# Patient Record
Sex: Female | Born: 1990 | Race: White | Hispanic: No | Marital: Single | Smoking: Former smoker
Health system: Southern US, Community
[De-identification: ages and names within clinical notes are randomized; demographics above are authoritative.]

## PROBLEM LIST (undated history)

## (undated) ENCOUNTER — Inpatient Hospital Stay (HOSPITAL_COMMUNITY): Payer: Self-pay

## (undated) ENCOUNTER — Ambulatory Visit

## (undated) DIAGNOSIS — R519 Headache, unspecified: Secondary | ICD-10-CM

## (undated) DIAGNOSIS — F419 Anxiety disorder, unspecified: Secondary | ICD-10-CM

## (undated) DIAGNOSIS — O24419 Gestational diabetes mellitus in pregnancy, unspecified control: Secondary | ICD-10-CM

## (undated) DIAGNOSIS — K760 Fatty (change of) liver, not elsewhere classified: Secondary | ICD-10-CM

## (undated) DIAGNOSIS — K219 Gastro-esophageal reflux disease without esophagitis: Secondary | ICD-10-CM

## (undated) DIAGNOSIS — Z8619 Personal history of other infectious and parasitic diseases: Secondary | ICD-10-CM

## (undated) DIAGNOSIS — J40 Bronchitis, not specified as acute or chronic: Secondary | ICD-10-CM

## (undated) DIAGNOSIS — J45909 Unspecified asthma, uncomplicated: Secondary | ICD-10-CM

## (undated) DIAGNOSIS — F329 Major depressive disorder, single episode, unspecified: Secondary | ICD-10-CM

## (undated) DIAGNOSIS — O1490 Unspecified pre-eclampsia, unspecified trimester: Secondary | ICD-10-CM

## (undated) DIAGNOSIS — F32A Depression, unspecified: Secondary | ICD-10-CM

## (undated) HISTORY — PX: GALLBLADDER SURGERY: SHX652

## (undated) HISTORY — PX: WISDOM TOOTH EXTRACTION: SHX21

---

## 2014-02-11 ENCOUNTER — Emergency Department (HOSPITAL_COMMUNITY)
Admission: EM | Admit: 2014-02-11 | Discharge: 2014-02-11 | Disposition: A | Discharge status: Home / Self Care | Payer: Medicaid Other | Attending: Emergency Medicine | Admitting: Emergency Medicine

## 2014-02-11 ENCOUNTER — Emergency Department (HOSPITAL_COMMUNITY): Payer: Medicaid Other

## 2014-02-11 ENCOUNTER — Encounter (HOSPITAL_COMMUNITY): Payer: Self-pay | Admitting: Emergency Medicine

## 2014-02-11 DIAGNOSIS — N390 Urinary tract infection, site not specified: Secondary | ICD-10-CM

## 2014-02-11 DIAGNOSIS — Z720 Tobacco use: Secondary | ICD-10-CM | POA: Insufficient documentation

## 2014-02-11 DIAGNOSIS — Z79899 Other long term (current) drug therapy: Secondary | ICD-10-CM | POA: Insufficient documentation

## 2014-02-11 DIAGNOSIS — Z331 Pregnant state, incidental: Secondary | ICD-10-CM | POA: Diagnosis not present

## 2014-02-11 DIAGNOSIS — F329 Major depressive disorder, single episode, unspecified: Secondary | ICD-10-CM | POA: Insufficient documentation

## 2014-02-11 DIAGNOSIS — M545 Low back pain: Secondary | ICD-10-CM | POA: Diagnosis present

## 2014-02-11 DIAGNOSIS — M549 Dorsalgia, unspecified: Secondary | ICD-10-CM

## 2014-02-11 DIAGNOSIS — J45909 Unspecified asthma, uncomplicated: Secondary | ICD-10-CM | POA: Diagnosis not present

## 2014-02-11 DIAGNOSIS — Z3201 Encounter for pregnancy test, result positive: Secondary | ICD-10-CM

## 2014-02-11 HISTORY — DX: Major depressive disorder, single episode, unspecified: F32.9

## 2014-02-11 HISTORY — DX: Unspecified asthma, uncomplicated: J45.909

## 2014-02-11 HISTORY — DX: Depression, unspecified: F32.A

## 2014-02-11 LAB — BASIC METABOLIC PANEL WITH GFR
Anion gap: 11 (ref 5–15)
BUN: 6 mg/dL (ref 6–23)
CO2: 21 mmol/L (ref 19–32)
Calcium: 8.9 mg/dL (ref 8.4–10.5)
Chloride: 106 meq/L (ref 96–112)
Creatinine, Ser: 0.66 mg/dL (ref 0.50–1.10)
GFR calc Af Amer: >90 mL/min
GFR calc non Af Amer: >90 mL/min
Glucose, Bld: 102 mg/dL — ABNORMAL HIGH (ref 70–99)
Potassium: 4.2 mmol/L (ref 3.5–5.1)
Sodium: 138 mmol/L (ref 135–145)

## 2014-02-11 LAB — CBC WITH DIFFERENTIAL/PLATELET
Basophils Absolute: 0 K/uL (ref 0.0–0.1)
Basophils Relative: 0 % (ref 0–1)
Eosinophils Absolute: 0 K/uL (ref 0.0–0.7)
Eosinophils Relative: 0 % (ref 0–5)
HCT: 37.2 % (ref 36.0–46.0)
Hemoglobin: 12.1 g/dL (ref 12.0–15.0)
Lymphocytes Relative: 20 % (ref 12–46)
Lymphs Abs: 1.9 K/uL (ref 0.7–4.0)
MCH: 27.3 pg (ref 26.0–34.0)
MCHC: 32.5 g/dL (ref 30.0–36.0)
MCV: 84 fL (ref 78.0–100.0)
Monocytes Absolute: 0.4 K/uL (ref 0.1–1.0)
Monocytes Relative: 4 % (ref 3–12)
Neutro Abs: 7.4 K/uL (ref 1.7–7.7)
Neutrophils Relative %: 76 % (ref 43–77)
Platelets: 241 K/uL (ref 150–400)
RBC: 4.43 MIL/uL (ref 3.87–5.11)
RDW: 14.9 % (ref 11.5–15.5)
WBC: 9.7 K/uL (ref 4.0–10.5)

## 2014-02-11 LAB — URINALYSIS, ROUTINE W REFLEX MICROSCOPIC
Bilirubin Urine: NEGATIVE
Glucose, UA: NEGATIVE mg/dL
KETONES UR: NEGATIVE mg/dL
Nitrite: NEGATIVE
Protein, ur: 30 mg/dL — AB
Specific Gravity, Urine: 1.012 (ref 1.005–1.030)
UROBILINOGEN UA: 0.2 mg/dL (ref 0.0–1.0)
pH: 7 (ref 5.0–8.0)

## 2014-02-11 LAB — WET PREP, GENITAL
Trich, Wet Prep: NONE SEEN
Yeast Wet Prep HPF POC: NONE SEEN

## 2014-02-11 LAB — URINE MICROSCOPIC-ADD ON

## 2014-02-11 LAB — PREGNANCY, URINE: Preg Test, Ur: POSITIVE — AB

## 2014-02-11 LAB — HCG, QUANTITATIVE, PREGNANCY: hCG, Beta Chain, Quant, S: 93 m[IU]/mL — ABNORMAL HIGH

## 2014-02-11 IMAGING — DX DG THORACIC SPINE 2V
3 series · 3 of 3 positions shown · non-contrast
Comparison: None.

CLINICAL DATA: Chronic pain and lower back pain for months,
worsened tonight. Initial encounter.

EXAM:
THORACIC SPINE - 2 VIEW

[t-spine ap]
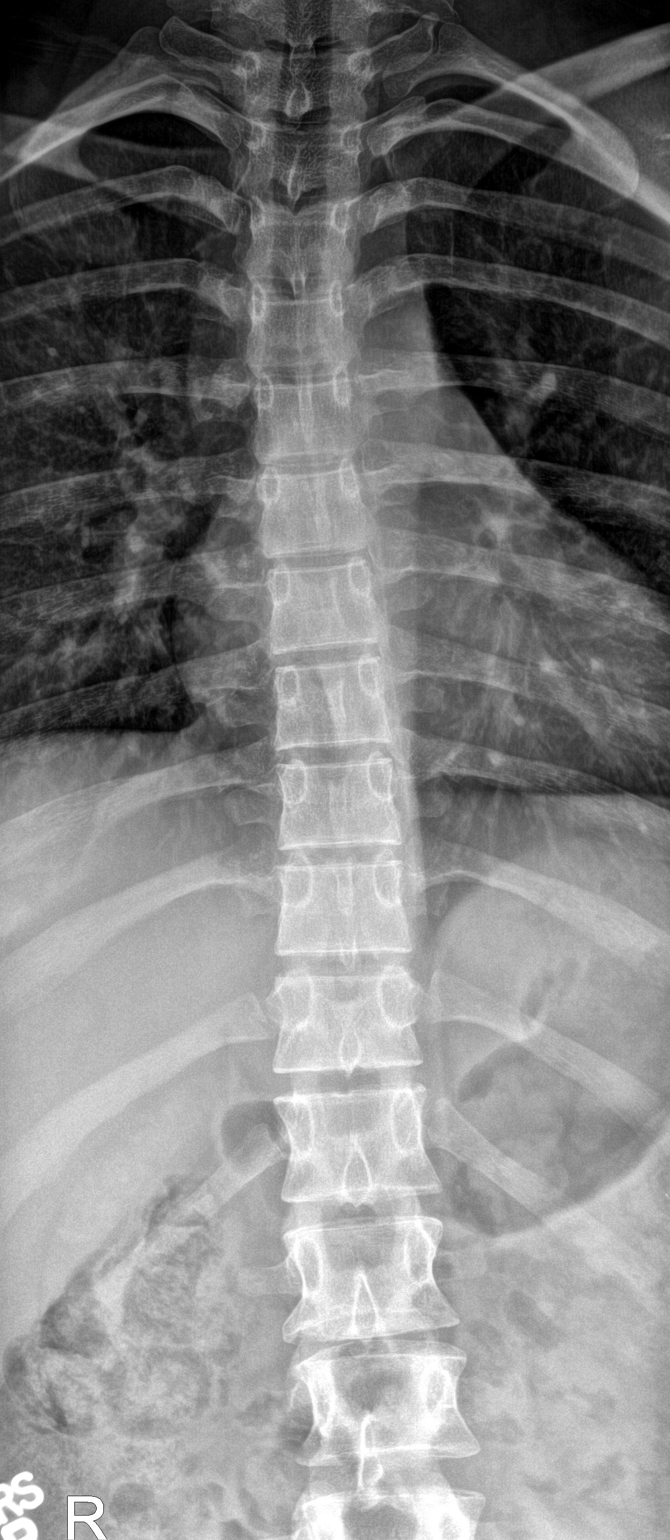

[t-spine lat]
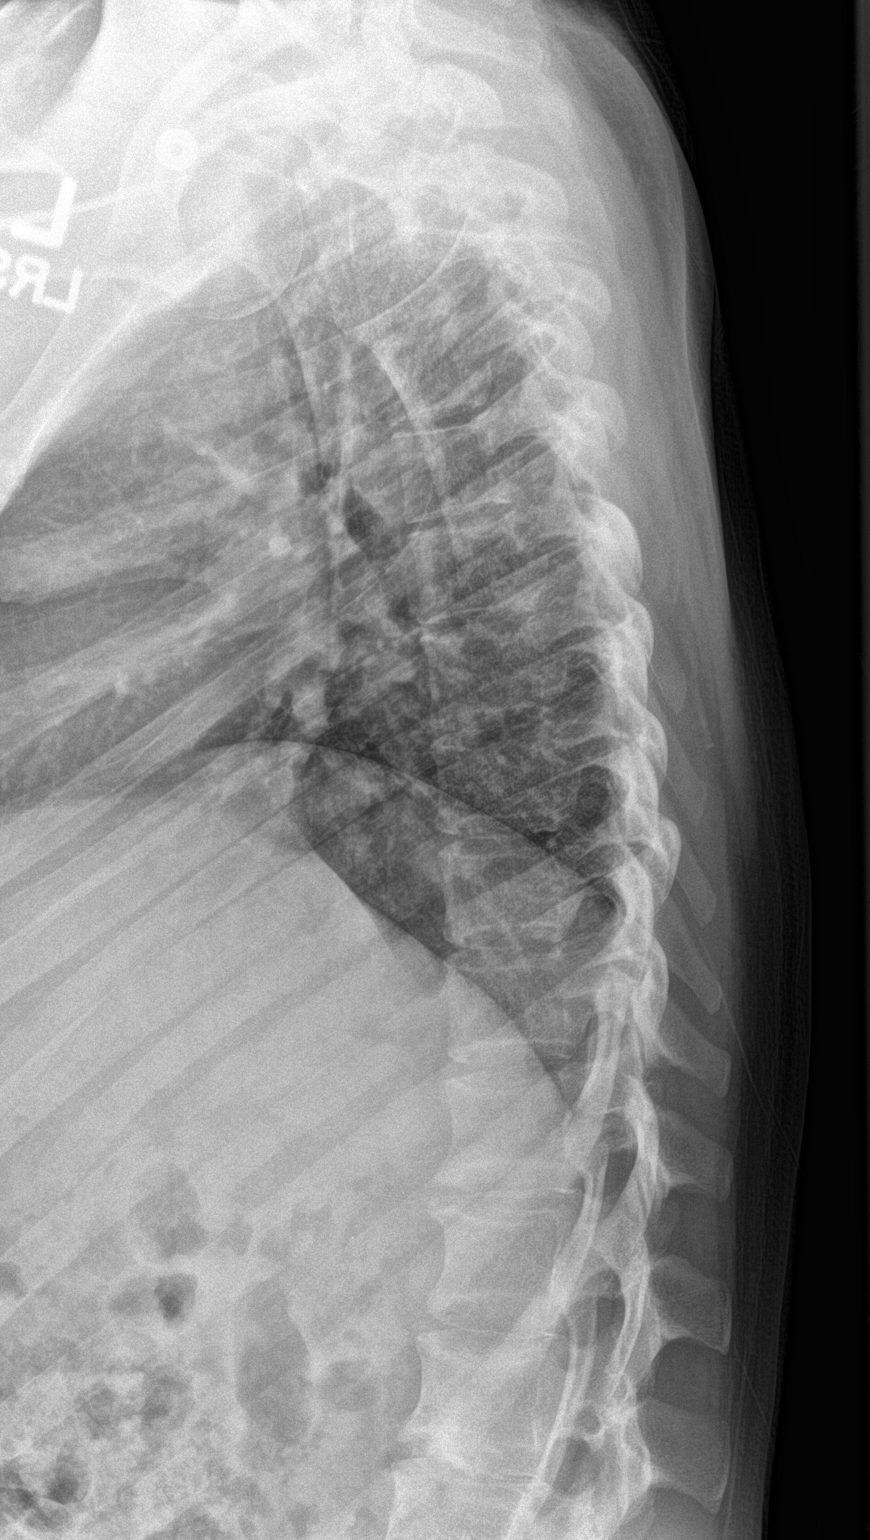

[t-spine swimmers]
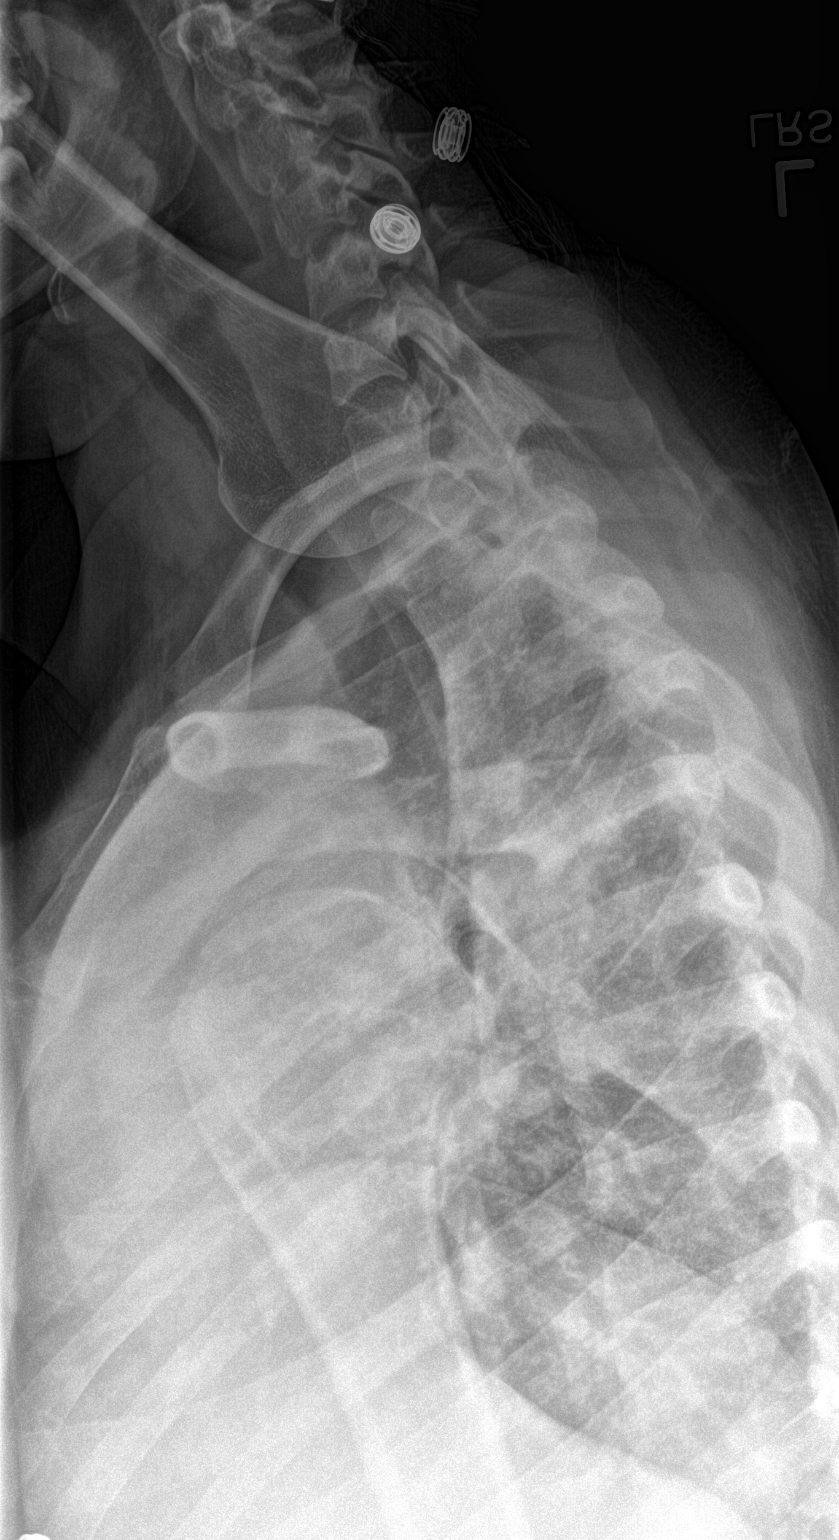

[3 of 3 positions shown; findings below may reference images not displayed]

FINDINGS: There is no evidence of fracture or subluxation. Vertebral bodies
demonstrate normal height and alignment. Intervertebral disc spaces
are preserved.

The visualized portions of both lungs are clear. The mediastinum is
unremarkable in appearance.
IMPRESSION: No evidence of fracture or subluxation along the thoracic spine.

## 2014-02-11 IMAGING — US US OB COMP LESS 14 WK
1 series · 13 of 28 positions shown · non-contrast
Comparison: None.

CLINICAL DATA: Back pain. Estimated gestational age by LMP is 3
weeks 0 days. Quantitative beta HCG is 93.

EXAM:
OBSTETRIC <14 WK US AND TRANSVAGINAL OB US
TECHNIQUE: Both transabdominal and transvaginal ultrasound examinations were
performed for complete evaluation of the gestation as well as the
maternal uterus, adnexal regions, and pelvic cul-de-sac.
Transvaginal technique was performed to assess early pregnancy.

[Series 1: us ob comp less 14 wk · 0.22mm/px · 31 acquisitions, 13 frames shown]
[im 2/31]
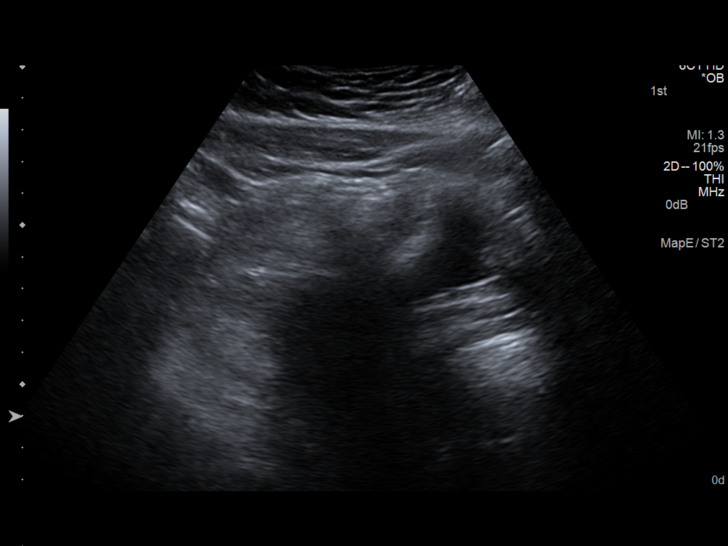
[im 4/31]
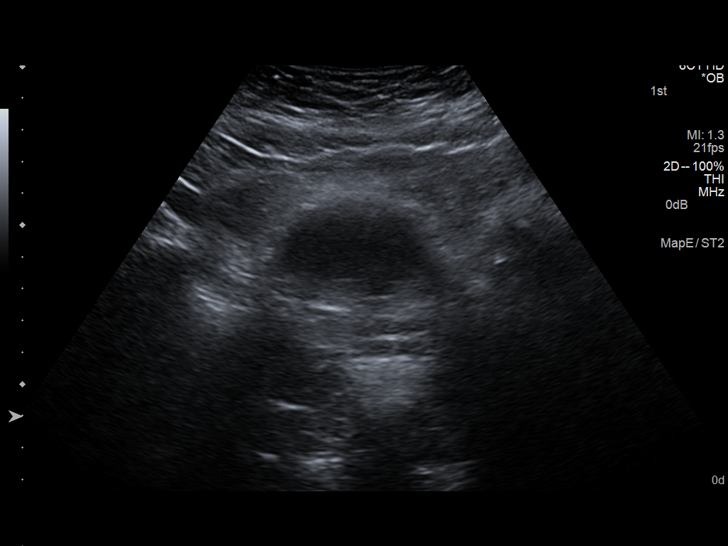
[im 6/31]
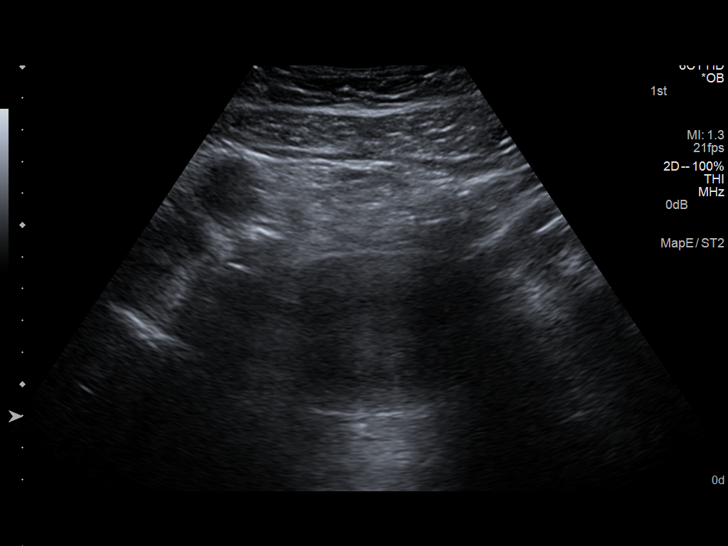
[im 8/31]
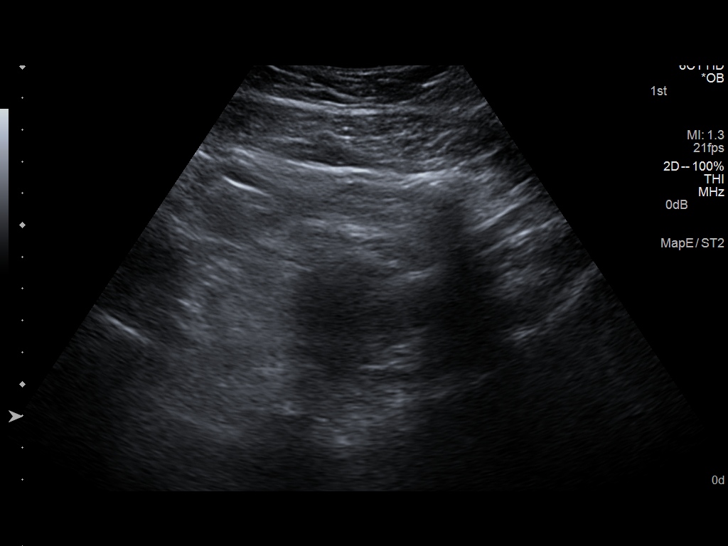
[im 11/31]
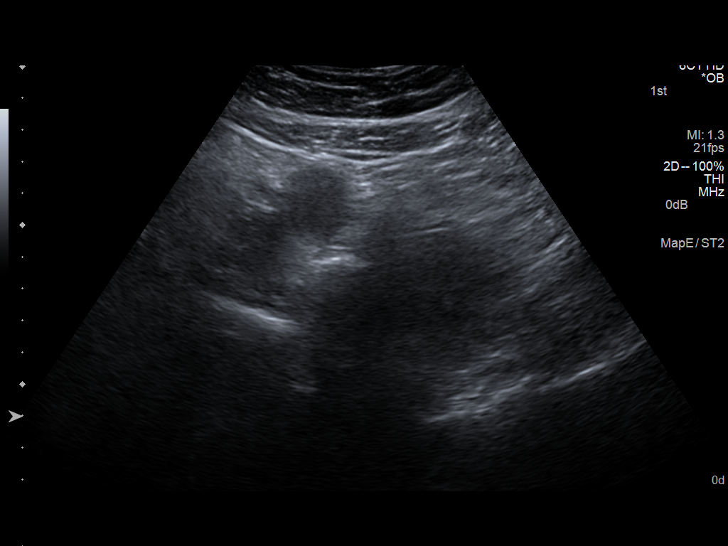
[im 13/31]
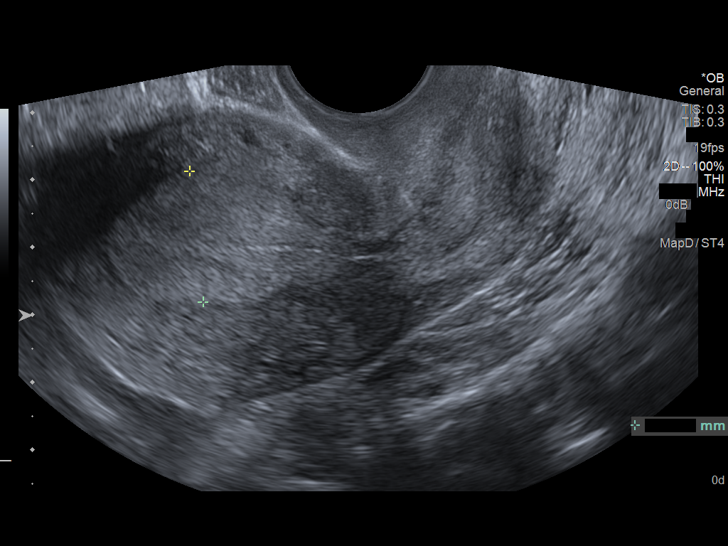
[im 16/31]
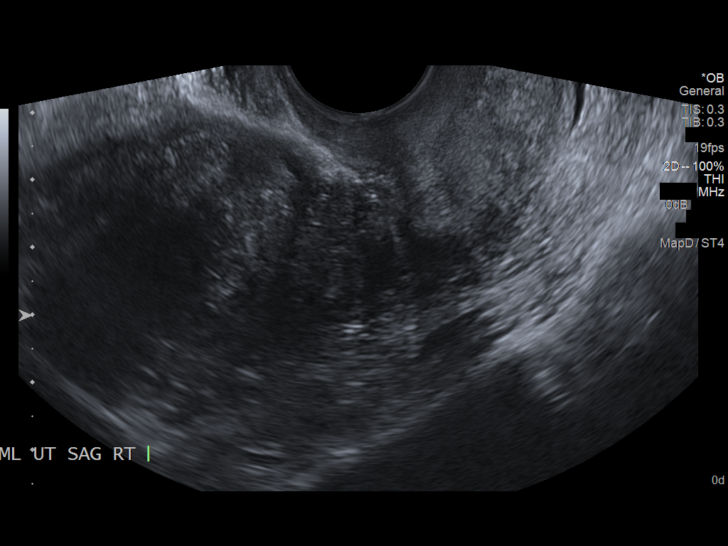
[im 18/31]
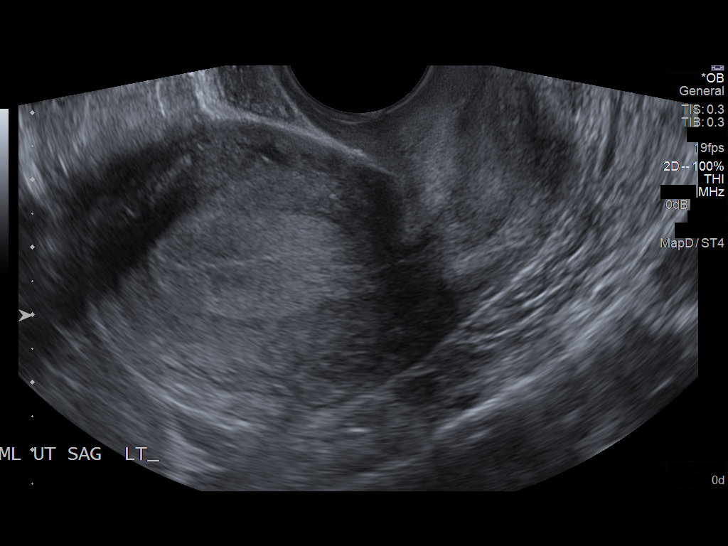
[im 21/31]
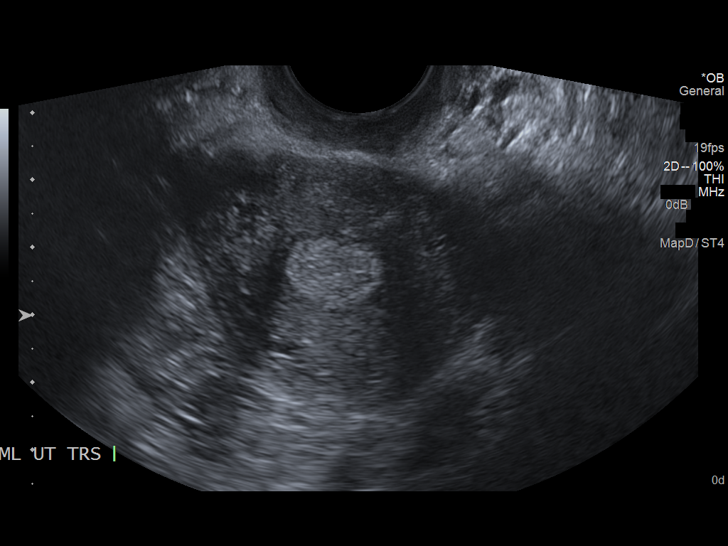
[im 23/31]
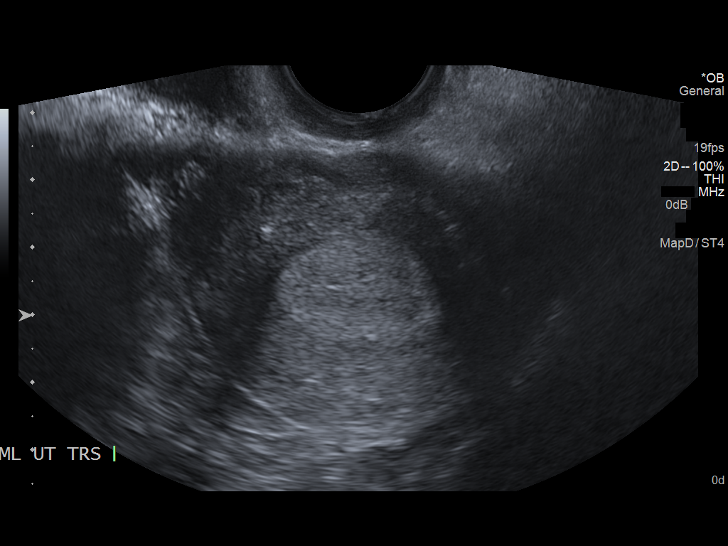
[im 25/31]
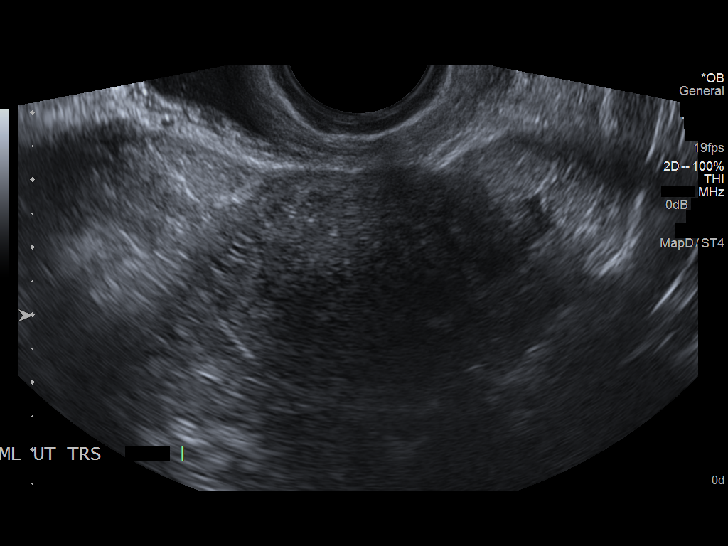
[im 27/31]
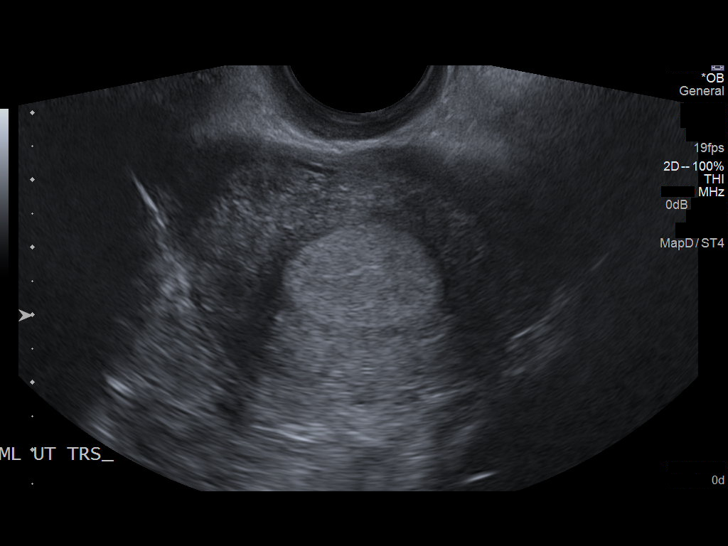
[im 29/31]
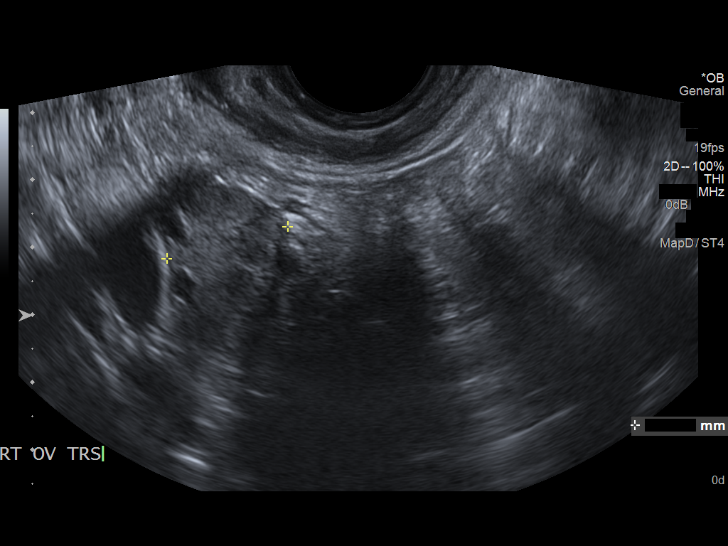

[13 of 28 positions shown; findings below may reference images not displayed]

FINDINGS: Intrauterine gestational sac: No intrauterine gestational sac is
identified.

Yolk sac:  Not identified.

Embryo:  Not identified.

Cardiac Activity: Not identified.

Maternal uterus/adnexae: Uterus is anteverted. No myometrial mass
lesions demonstrated. Endometrial stripe is thickened about 19 mm.
No endometrial fluid is demonstrated. The right ovary is visualized
and appears normal. No abnormal adnexal masses. Left ovary is not
visualized due to overlying bowel gas. No free pelvic fluid is
demonstrated.
IMPRESSION: No intrauterine gestational sac, yolk sac, or fetal pole identified.
Differential considerations include intrauterine pregnancy too early
to be sonographically visualized, missed abortion, or ectopic
pregnancy. Followup ultrasound is recommended in 10-14 days for
further evaluation.

## 2014-02-11 IMAGING — DX DG LUMBAR SPINE COMPLETE 4+V
5 series · 5 of 5 positions shown · non-contrast
Comparison: None.

CLINICAL DATA: Chronic mid and lower back pain, worsening tonight.
Initial encounter.

EXAM:
LUMBAR SPINE - COMPLETE 4+ VIEW

[l-spine ap]
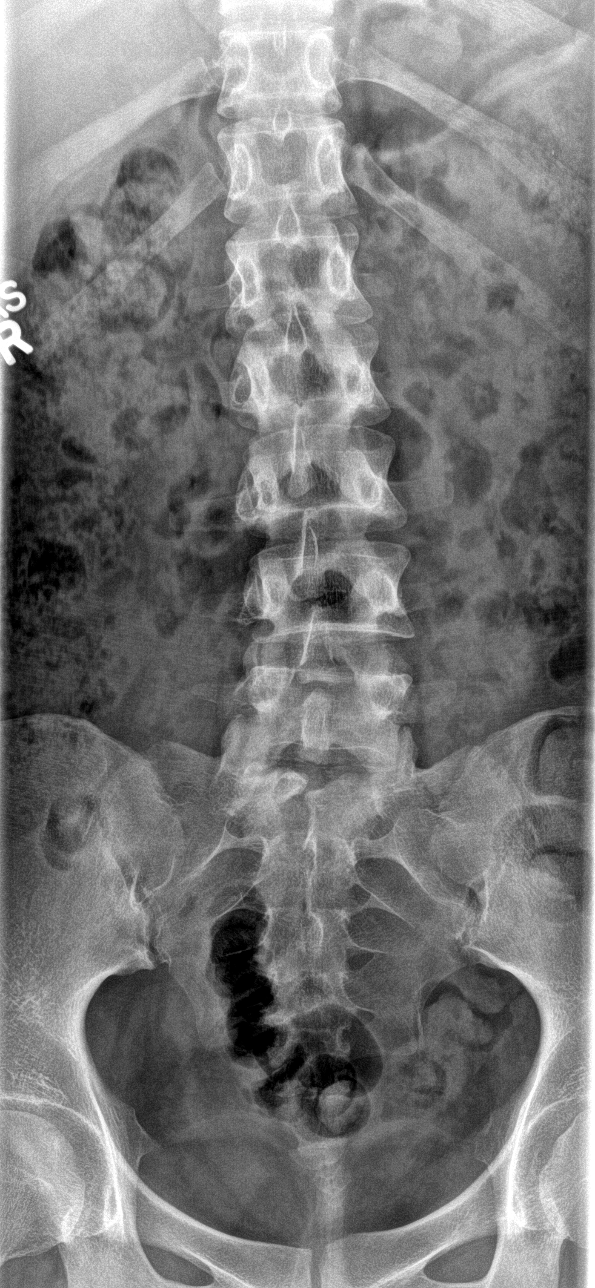

[l-spine obl (1 of 2)]
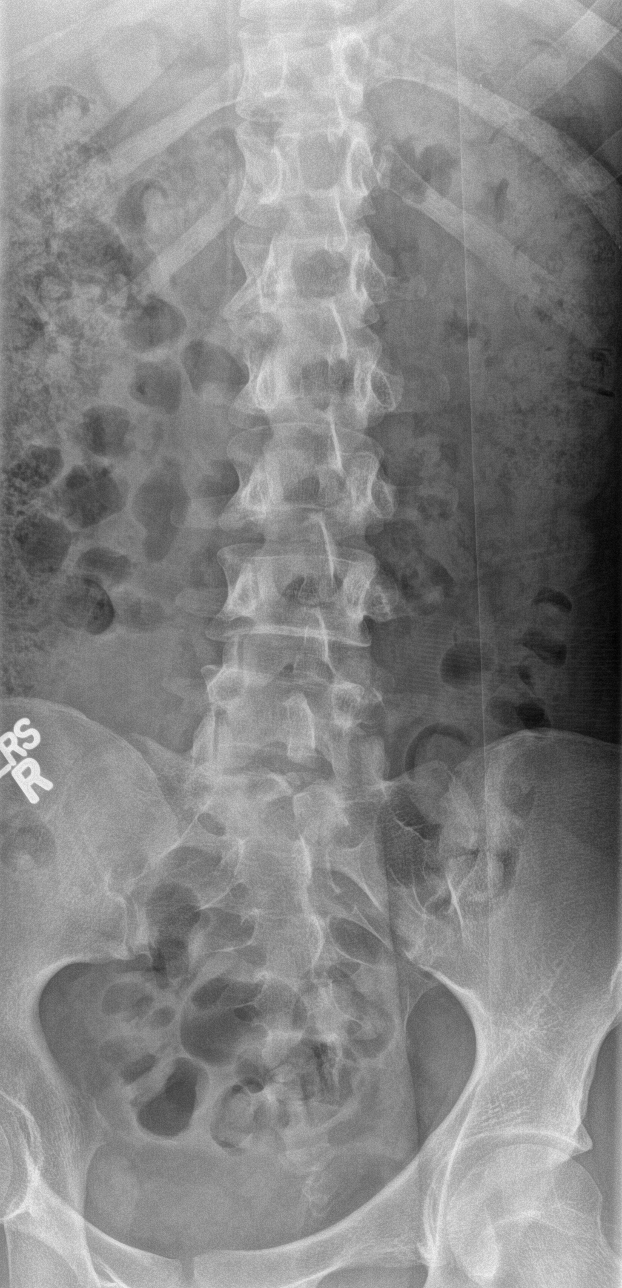

[l-spine obl (2 of 2)]
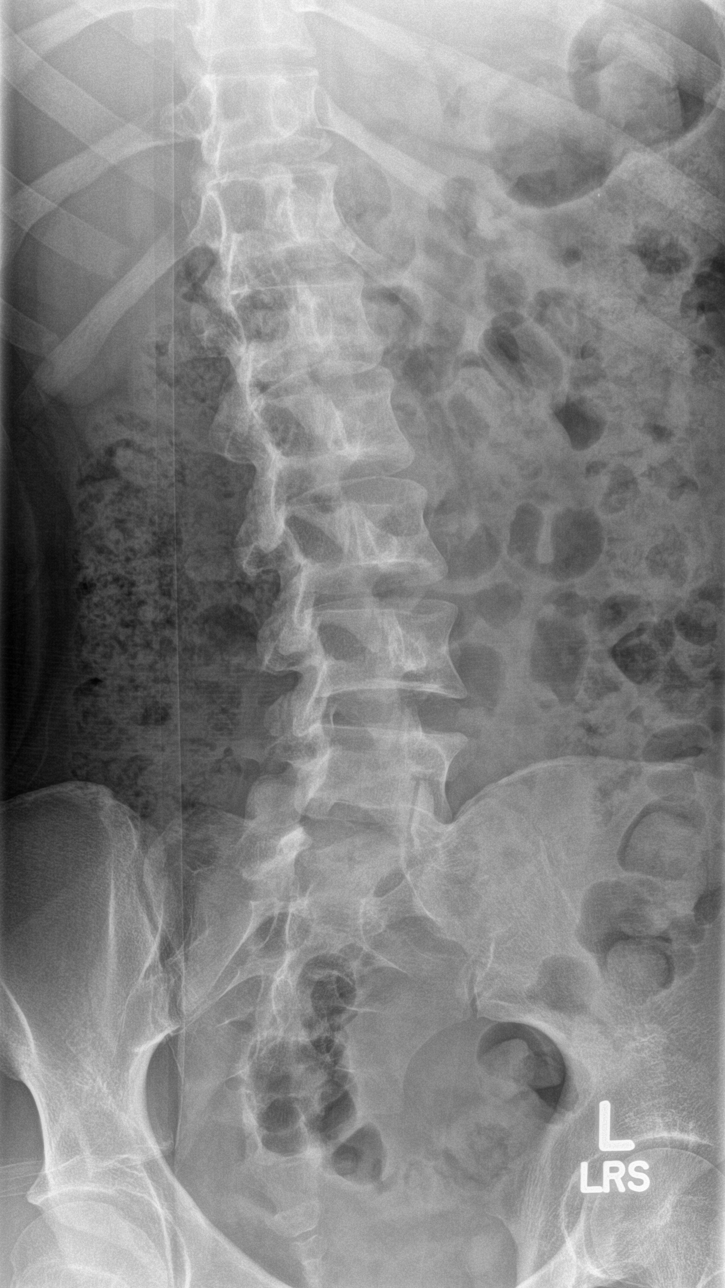

[l-spine lat]
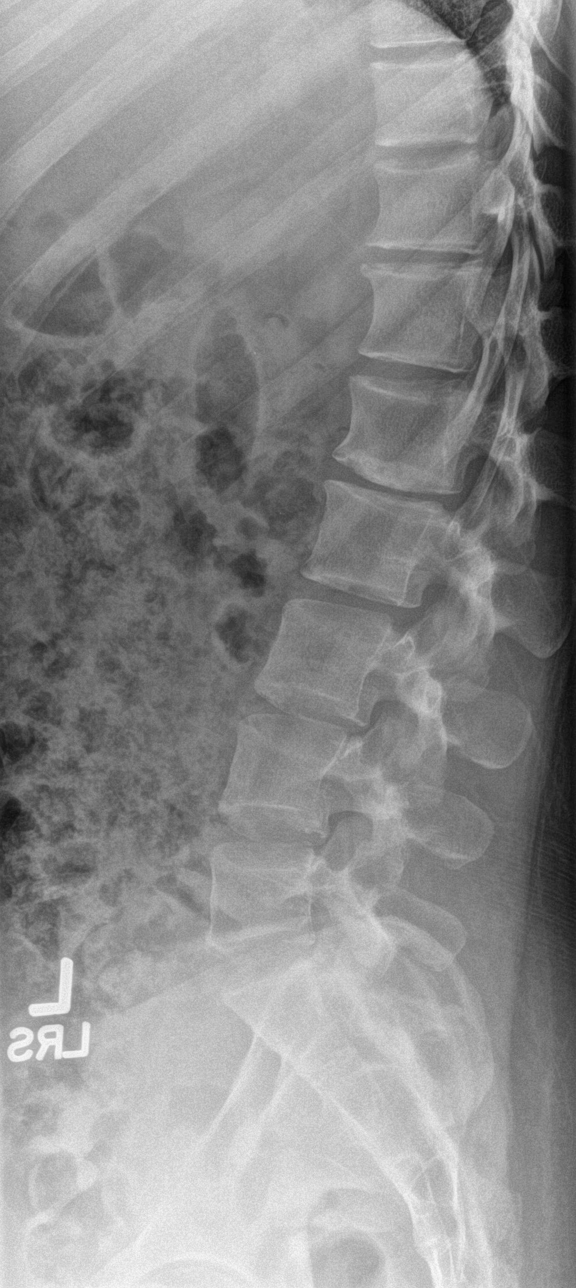

[l-spine spot]
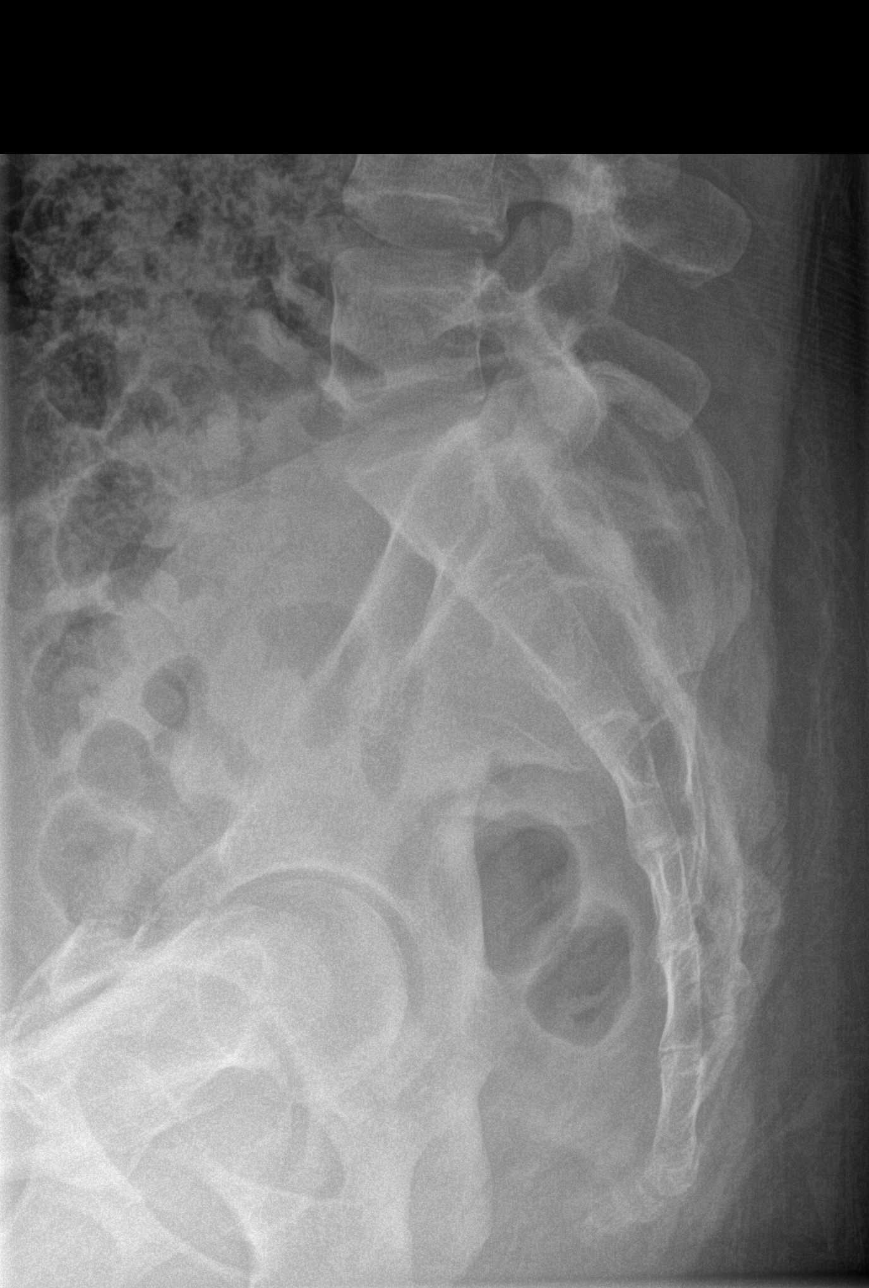

[5 of 5 positions shown; findings below may reference images not displayed]

FINDINGS: There is no evidence of fracture or subluxation. Vertebral bodies
demonstrate normal height and alignment. Slight developmental
irregularity is noted along the inferior endplate of L1.
Intervertebral disc spaces are preserved. The visualized neural
foramina are grossly unremarkable in appearance.

The visualized bowel gas pattern is unremarkable in appearance; air
and stool are noted within the colon. The sacroiliac joints are
within normal limits.
IMPRESSION: No evidence of fracture or subluxation along the lumbar spine.

## 2014-02-11 MED ORDER — ACETAMINOPHEN 500 MG PO TABS: 500.0000 mg | ORAL_TABLET | Freq: Four times a day (QID) | ORAL | Status: DC | PRN

## 2014-02-11 MED ORDER — CEPHALEXIN 500 MG PO CAPS: 500.0000 mg | ORAL_CAPSULE | Freq: Three times a day (TID) | ORAL | Status: DC

## 2014-02-11 MED ORDER — OXYCODONE-ACETAMINOPHEN 5-325 MG PO TABS
2.0000 | ORAL_TABLET | Freq: Once | ORAL | Status: DC
  Filled 2014-02-11: qty 2

## 2014-02-11 MED ORDER — SODIUM CHLORIDE 0.9 % IV BOLUS (SEPSIS)
1000.0000 mL | Freq: Once | INTRAVENOUS | Status: AC
  Administered 2014-02-11: 1000 mL via INTRAVENOUS

## 2014-02-11 MED ORDER — DEXTROSE 5 % IV SOLN
1.0000 g | Freq: Once | INTRAVENOUS | Status: AC
  Administered 2014-02-11: 1 g via INTRAVENOUS
  Filled 2014-02-11: qty 10

## 2014-02-11 NOTE — ED Notes (Signed)
Pt. reports mid back and left lower back pain onset today , denies injury /ambulatory , no hematuria or dysuria .

## 2014-02-11 NOTE — ED Provider Notes (Signed)
CSN: [id number]     Arrival date & time 02/11/14  0028 History   First MD Initiated Contact with Patient 02/11/14 0053     Chief Complaint  Patient presents with  . Back Pain    (Consider location/radiation/quality/duration/timing/severity/associated sxs/prior Treatment) HPI Comments: 24 year old female with a history of depression and asthma presents to the emergency department for further evaluation of back pain. Patient states that she began to have back pain yesterday morning. It persisted for most of the day, but improved later in the afternoon. Patient states the pain returned around dinnertime yesterday. It was worse than prior onset. Patient attempted to take ibuprofen as well as use a heating pad for symptoms without relief. She states that pain is worse with certain movements as well as palpation to her back. She states that her father tried to massage her back as well, but this provided no relief. Patient reports some mild associated nausea with worsening pain. Patient denies associated fever, abdominal pain, dysuria, hematuria, vaginal bleeding, vaginal discharge, extremity numbness/paresthesias, extremity weakness, history of cancer, history of IV drug use, or bowel/bladder incontinence. Patient denies any trauma or injury to her back inciting symptoms.  Patient is a 24 y.o. female presenting with back pain. The history is provided by the patient. No language interpreter was used.  Back Pain Associated symptoms: no dysuria, no fever, no numbness and no weakness     Past Medical History  Diagnosis Date  . Depression   . Asthma    History reviewed. No pertinent past surgical history. No family history on file. History  Substance Use Topics  . Smoking status: Current Every Day Smoker  . Smokeless tobacco: Not on file  . Alcohol Use: Yes   OB History    No data available      Review of Systems  Constitutional: Negative for fever.  Gastrointestinal: Negative for nausea and  vomiting.  Genitourinary: Negative for dysuria, hematuria, vaginal bleeding and vaginal discharge.  Musculoskeletal: Positive for back pain.  Neurological: Negative for weakness and numbness.  All other systems reviewed and are negative.   Allergies  Review of patient's allergies indicates no known allergies.  Home Medications   Prior to Admission medications   Medication Sig Start Date End Date Taking? Authorizing Provider  acetaminophen (TYLENOL) 500 MG tablet Take 1 tablet (500 mg total) by mouth every 6 (six) hours as needed. 02/11/14   Antony Madura, PA-C  cephALEXin (KEFLEX) 500 MG capsule Take 1 capsule (500 mg total) by mouth 3 (three) times daily. 02/11/14   Antony Madura, PA-C  FLUoxetine (PROZAC) 20 MG capsule Take 40 mg by mouth daily.   Yes Historical Provider, MD  ibuprofen (ADVIL,MOTRIN) 200 MG tablet Take 200 mg by mouth every 6 (six) hours as needed for moderate pain.   Yes Historical Provider, MD   BP 107/65 mmHg  Pulse 90  Temp(Src) 99.2 F (37.3 C) (Oral)  Resp 18  Ht  (1.626 m)  Wt 157 lb (71.215 kg)  BMI 26.94 kg/m2  SpO2 100%  LMP 01/21/2014   Physical Exam  Constitutional: She is oriented to person, place, and time. She appears well-developed and well-nourished. No distress.  Nontoxic/nonseptic appearing. Patient does appear uncomfortable.  HENT:  Head: Normocephalic and atraumatic.  Eyes: Conjunctivae and EOM are normal. No scleral icterus.  Neck: Normal range of motion.  Cardiovascular: Normal rate, regular rhythm and intact distal pulses.   DP and PT pulses 2+ bilaterally  Pulmonary/Chest: Effort normal. No respiratory distress.  Respirations even and unlabored  Abdominal: Soft. She exhibits no distension. There is no tenderness.  Abdomen soft without tenderness or peritoneal signs. No guarding. No masses.  Genitourinary: There is no rash, tenderness, lesion or injury on the right labia. There is no rash, tenderness, lesion or injury on the left  labia. Uterus is not tender. Cervix exhibits no motion tenderness, no discharge and no friability. Right adnexum displays no mass, no tenderness and no fullness. Left adnexum displays no mass, no tenderness and no fullness. No bleeding in the vagina. No vaginal discharge found.  Musculoskeletal: Normal range of motion.  Tenderness to palpation to lumbar midline. No crepitus or bony deformity appreciated. Mild tenderness to bilateral lumbar paraspinal muscles without appreciable spasm. Negative straight leg raise and crossed straight leg raise.  Neurological: She is alert and oriented to person, place, and time. She exhibits normal muscle tone. Coordination normal.  Sensation to light touch intact. Patellar and Achilles reflexes 2+ bilaterally  Skin: Skin is warm and dry. No rash noted. She is not diaphoretic. No erythema. No pallor.  Psychiatric: She has a normal mood and affect. Her behavior is normal.  Nursing note and vitals reviewed.   ED Course  Procedures (including critical care time) Labs Review Labs Reviewed  WET PREP, GENITAL - Abnormal; Notable for the following:    Clue Cells Wet Prep HPF POC FEW (*)    WBC, Wet Prep HPF POC FEW (*)    All other components within normal limits  URINALYSIS, ROUTINE W REFLEX MICROSCOPIC - Abnormal; Notable for the following:    APPearance CLOUDY (*)    Hgb urine dipstick MODERATE (*)    Protein, ur 30 (*)    Leukocytes, UA LARGE (*)    All other components within normal limits  PREGNANCY, URINE - Abnormal; Notable for the following:    Preg Test, Ur POSITIVE (*)    All other components within normal limits  URINE MICROSCOPIC-ADD ON - Abnormal; Notable for the following:    Squamous Epithelial / LPF FEW (*)    Bacteria, UA FEW (*)    All other components within normal limits  BASIC METABOLIC PANEL - Abnormal; Notable for the following:    Glucose, Bld 102 (*)    All other components within normal limits  HCG, QUANTITATIVE, PREGNANCY -  Abnormal; Notable for the following:    hCG, Beta Chain, Quant, S 93 (*)    All other components within normal limits  GC/CHLAMYDIA PROBE AMP  URINE CULTURE  CBC WITH DIFFERENTIAL    Imaging Review Dg Thoracic Spine 2 View  02/11/2014   CLINICAL DATA:  Chronic pain and lower back pain for months, worsened tonight. Initial encounter.  EXAM: THORACIC SPINE - 2 VIEW  COMPARISON:  None.  FINDINGS: There is no evidence of fracture or subluxation. Vertebral bodies demonstrate normal height and alignment. Intervertebral disc spaces are preserved.  The visualized portions of both lungs are clear. The mediastinum is unremarkable in appearance.  IMPRESSION: No evidence of fracture or subluxation along the thoracic spine.   Electronically Signed   By: Roanna Raider M.D.   On: 02/11/2014 01:43   Dg Lumbar Spine Complete  02/11/2014   CLINICAL DATA:  Chronic mid and lower back pain, worsening tonight. Initial encounter.  EXAM: LUMBAR SPINE - COMPLETE 4+ VIEW  COMPARISON:  None.  FINDINGS: There is no evidence of fracture or subluxation. Vertebral bodies demonstrate normal height and alignment. Slight developmental irregularity is noted along the inferior endplate  of L1. Intervertebral disc spaces are preserved. The visualized neural foramina are grossly unremarkable in appearance.  The visualized bowel gas pattern is unremarkable in appearance; air and stool are noted within the colon. The sacroiliac joints are within normal limits.  IMPRESSION: No evidence of fracture or subluxation along the lumbar spine.   Electronically Signed   By: Roanna Raider M.D.   On: 02/11/2014 01:42   US Ob Comp Less 14 Wks  02/11/2014   CLINICAL DATA:  Back pain. Estimated gestational age by LMP is 3 weeks 0 days. Quantitative beta HCG is 93.  EXAM: OBSTETRIC <14 WK [place] AND TRANSVAGINAL OB US  TECHNIQUE: Both transabdominal and transvaginal ultrasound examinations were performed for complete evaluation of the gestation as well as the  maternal uterus, adnexal regions, and pelvic cul-de-sac. Transvaginal technique was performed to assess early pregnancy.  COMPARISON:  None.  FINDINGS: Intrauterine gestational sac: No intrauterine gestational sac is identified.  Yolk sac:  Not identified.  Embryo:  Not identified.  Cardiac Activity: Not identified.  Maternal uterus/adnexae: Uterus is anteverted. No myometrial mass lesions demonstrated. Endometrial stripe is thickened about 19 mm. No endometrial fluid is demonstrated. The right ovary is visualized and appears normal. No abnormal adnexal masses. Left ovary is not visualized due to overlying bowel gas. No free pelvic fluid is demonstrated.  IMPRESSION: No intrauterine gestational sac, yolk sac, or fetal pole identified. Differential considerations include intrauterine pregnancy too early to be sonographically visualized, missed abortion, or ectopic pregnancy. Followup ultrasound is recommended in 10-14 days for further evaluation.   Electronically Signed   By: Burman Nieves M.D.   On: 02/11/2014 03:43   US Ob Transvaginal  02/11/2014   CLINICAL DATA:  Back pain. Estimated gestational age by LMP is 3 weeks 0 days. Quantitative beta HCG is 93.  EXAM: OBSTETRIC <14 WK [place] AND TRANSVAGINAL OB US  TECHNIQUE: Both transabdominal and transvaginal ultrasound examinations were performed for complete evaluation of the gestation as well as the maternal uterus, adnexal regions, and pelvic cul-de-sac. Transvaginal technique was performed to assess early pregnancy.  COMPARISON:  None.  FINDINGS: Intrauterine gestational sac: No intrauterine gestational sac is identified.  Yolk sac:  Not identified.  Embryo:  Not identified.  Cardiac Activity: Not identified.  Maternal uterus/adnexae: Uterus is anteverted. No myometrial mass lesions demonstrated. Endometrial stripe is thickened about 19 mm. No endometrial fluid is demonstrated. The right ovary is visualized and appears normal. No abnormal adnexal masses. Left  ovary is not visualized due to overlying bowel gas. No free pelvic fluid is demonstrated.  IMPRESSION: No intrauterine gestational sac, yolk sac, or fetal pole identified. Differential considerations include intrauterine pregnancy too early to be sonographically visualized, missed abortion, or ectopic pregnancy. Followup ultrasound is recommended in 10-14 days for further evaluation.   Electronically Signed   By: Burman Nieves M.D.   On: 02/11/2014 03:43     EKG Interpretation None      MDM   Final diagnoses:  Back pain  Positive pregnancy test  UTI (lower urinary tract infection)    24 year old female presents to the emergency department for further evaluation of persistent back pain. Back pain began yesterday morning and persisted through the evening. Urinalysis completed shortly after patient arrival. Urinalysis suggests infection. Possible that back pain is a result of pyelonephritis/ascending infection. Urine pregnancy, however, was also found to be positive. Patient unfortunately was brought x-ray prior to resulting of her urine pregnancy. Radiology was informed of premature imaging; safety portal completed  by Set designerX-ray supervisor. Imaging found to be negative for acute bony pathology.  Given positive urine pregnancy, further work up was pursued with blood work and ultrasound imaging. Patient was found to have no leukocytosis today. Kidney function was preserved. No anemia or electrolyte imbalance. hCG Quant was 93 today. This correlates with pregnancy of approximately 2 weeks, c/w inability to visualize any specific findings on pelvic U/S.   I have discussed with the patient inability to rule out ectopic pregnancy given today's workup. Have [facility] Will also treat for UTI/early pyelonephritis with outpatient Keflex course. Initial Rocephin bolus given PTD. Tylenol advised for pain control and return precautions discussed. Patient  agreeable to plan with no unaddressed concerns. Patient discharged in good condition; VSS.   Filed Vitals:   02/11/14 0036 02/11/14 0315 02/11/14 0454  BP: 112/62 117/62 107/65  Pulse: 98 98 90  Temp: 99.2 F (37.3 C)    TempSrc: Oral    Resp: 16  18  Height: 5\' 4"  (1.626 m)    Weight: 157 lb (71.215 kg)    SpO2: 99% 100% 100%     Antony MaduraKelly Demarlo Riojas, PA-C 02/11/14 0522  Tomasita CrumbleAdeleke Oni, MD 02/11/14 1417

## 2014-02-11 NOTE — Discharge Instructions (Signed)
Take Keflex as prescribed for infection. Take Tylenol as needed for pain control. Follow-up with women's outpatient clinic in 48 hours to have a repeat hCG test completed. Your ultrasound today was unable to visualize any possible pregnancy. It is likely that it is too early in your pregnancy to visualize anything on ultrasound. You may follow-up with [facility] if symptoms worsen. You may also follow-up in this emergency department as needed for worsening symptoms. Discontinue Prozac until you are able to speak with your prescribing doctor.  Pyelonephritis, Adult Pyelonephritis is a kidney infection. In general, there are 2 main types of pyelonephritis:  Infections that come on quickly without any warning (acute pyelonephritis).  Infections that persist for a long period of time (chronic pyelonephritis). CAUSES  Two main causes of pyelonephritis are:  Bacteria traveling from the bladder to the kidney. This is a problem especially in pregnant women. The urine in the bladder can become filled with bacteria from multiple causes, including:  Inflammation of the prostate gland (prostatitis).  Sexual intercourse in females.  Bladder infection (cystitis).  Bacteria traveling from the bloodstream to the tissue part of the kidney. Problems that may increase your risk of getting a kidney infection include:  Diabetes.  Kidney stones or bladder stones.  Cancer.  Catheters placed in the bladder.  Other abnormalities of the kidney or ureter. SYMPTOMS   Abdominal pain.  Pain in the side or flank area.  Fever.  Chills.  Upset stomach.  Blood in the urine (dark urine).  Frequent urination.  Strong or persistent urge to urinate.  Burning or stinging when urinating. DIAGNOSIS  Your caregiver may diagnose your kidney infection based on your symptoms. A urine sample may also be taken. TREATMENT  In general, treatment depends on how severe the infection is.   If the infection  is mild and caught early, your caregiver may treat you with oral antibiotics and send you home.  If the infection is more severe, the bacteria may have gotten into the bloodstream. This will require intravenous (IV) antibiotics and a hospital stay. Symptoms may include:  High fever.  Severe flank pain.  Shaking chills.  Even after a hospital stay, your caregiver may require you to be on oral antibiotics for a period of time.  Other treatments may be required depending upon the cause of the infection. HOME CARE INSTRUCTIONS   Take your antibiotics as directed. Finish them even if you start to feel better.  Make an appointment to have your urine checked to make sure the infection is gone.  Drink enough fluids to keep your urine clear or pale yellow.  Take medicines for the bladder if you have urgency and frequency of urination as directed by your caregiver. SEEK IMMEDIATE MEDICAL CARE IF:   You have a fever or persistent symptoms for more than 2-3 days.  You have a fever and your symptoms suddenly get worse.  You are unable to take your antibiotics or fluids.  You develop shaking chills.  You experience extreme weakness or fainting.  There is no improvement after 2 days of treatment. MAKE SURE YOU:  Understand these instructions.  Will watch your condition.  Will get help right away if you are not doing well or get worse. Document Released: 01/22/2005 Document Revised: 07/24/2011 Document Reviewed: 06/28/2010 [facility] Patient Information 2015 [place], [place]. This information is not intended to replace advice given to you by your health care provider. Make sure you discuss any questions you have with your health care  provider. Prenatal Care  WHAT IS PRENATAL CARE?  Prenatal care means health care during your pregnancy, before your baby is born. It is very important to take care of yourself and your baby during your pregnancy by:   Getting early prenatal care. If you know  you are pregnant, or think you might be pregnant, call your health care provider as soon as possible. Schedule a visit for a prenatal exam.  Getting regular prenatal care. Follow your health care provider's schedule for blood and other necessary tests. Do not miss appointments.  Doing everything you can to keep yourself and your baby healthy during your pregnancy.  Getting complete care. Prenatal care should include evaluation of the medical, dietary, educational, psychological, and social needs of you and your significant other. The medical and genetic history of your family and the family of your baby's father should be discussed with your health care provider.  Discussing with your health care provider:  Prescription, over-the-counter, and herbal medicines that you take.  Any history of substance abuse, alcohol use, smoking, and illegal drug use.  Any history of domestic abuse and violence.  Immunizations you have received.  Your nutrition and diet.  The amount of exercise you do.  Any environmental and occupational hazards to which you are exposed.  History of sexually transmitted infections for both you and your partner.  Previous pregnancies you have had. WHY IS PRENATAL CARE SO IMPORTANT?  By regularly seeing your health care provider, you help ensure that problems can be identified early so that they can be treated as soon as possible. Other problems might be prevented. Many studies have shown that early and regular prenatal care is important for the health of mothers and their babies.  HOW CAN I TAKE CARE OF MYSELF WHILE I AM PREGNANT?  Here are ways to take care of yourself and your baby:   Start or continue taking your multivitamin with 400 micrograms (mcg) of folic acid every day.  Get early and regular prenatal care. It is very important to see a health care provider during your pregnancy. Your health care provider will check at each visit to make sure that you and your  baby are healthy. If there are any problems, action can be taken right away to help you and your baby.  Eat a healthy diet that includes:  Fruits.  Vegetables.  Foods low in saturated fat.  Whole grains.  Calcium-rich foods, such as milk, yogurt, and hard cheeses.  Drink 6-8 glasses of liquids a day.  Unless your health care provider tells you not to, try to be physically active for 30 minutes, most days of the week. If you are pressed for time, you can get your activity in through 10-minute segments, three times a day.  Do not smoke, drink alcohol, or use drugs. These can cause long-term damage to your baby. Talk with your health care provider about steps to take to stop smoking. Talk with a member of your faith community, a counselor, a trusted friend, or your health care provider if you are concerned about your alcohol or drug use.  Ask your health care provider before taking any medicine, even over-the-counter medicines. Some medicines are not safe to take during pregnancy.  Get plenty of rest and sleep.  Avoid hot tubs and saunas during pregnancy.  Do not have X-rays taken unless absolutely necessary and with the recommendation of your health care provider. A lead shield can be placed on your abdomen to protect your  baby when X-rays are taken in other parts of your body.  Do not empty the cat litter when you are pregnant. It may contain a parasite that causes an infection called toxoplasmosis, which can cause birth defects. Also, use gloves when working in garden areas used by cats.  Do not eat uncooked or undercooked meats or fish.  Do not eat soft, mold-ripened cheeses (Brie, Camembert, and chevre) or soft, blue-veined cheese (Danish blue and Roquefort).  Stay away from toxic chemicals like:  Insecticides.  Solvents (some cleaners or paint thinners).  Lead.  Mercury.  Sexual intercourse may continue until the end of the pregnancy, unless you have a medical problem  or there is a problem with the pregnancy and your health care provider tells you not to.  Do not wear high-heel shoes, especially during the second half of the pregnancy. You can lose your balance and fall.  Do not take long trips, unless absolutely necessary. Be sure to see your health care provider before going on the trip.  Do not sit in one position for more than 2 hours when on a trip.  Take a copy of your medical records when going on a trip. Know where a hospital is located in the city you are visiting, in case of an emergency.  Most dangerous household products will have pregnancy warnings on their labels. Ask your health care provider about products if you are unsure.  Limit or eliminate your caffeine intake from coffee, tea, sodas, medicines, and chocolate.  Many women continue working through pregnancy. Staying active might help you stay healthier. If you have a question about the safety or the hours you work at your particular job, talk with your health care provider.  Get informed:  Read books.  Watch videos.  Go to childbirth classes for you and your significant other.  Talk with experienced moms.  Ask your health care provider about childbirth education classes for you and your partner. Classes can help you and your partner prepare for the birth of your baby.  Ask about a baby doctor (pediatrician) and methods and pain medicine for labor, delivery, and possible cesarean delivery. HOW OFTEN SHOULD I SEE MY HEALTH CARE PROVIDER DURING PREGNANCY?  Your health care provider will give you a schedule for your prenatal visits. You will have visits more often as you get closer to the end of your pregnancy. An average pregnancy lasts about 40 weeks.  A typical schedule includes visiting your health care provider:   About once each month during your first 6 months of pregnancy.  Every 2 weeks during the next 2 months.  Weekly in the last month, until the delivery date. Your  health care provider will probably want to see you more often if:  You are older than 35 years.  Your pregnancy is high risk because you have certain health problems or problems with the pregnancy, such as:  Diabetes.  High blood pressure.  The baby is not growing on schedule, according to the dates of the pregnancy. Your health care provider will do special tests to make sure you and your baby are not having any serious problems. WHAT HAPPENS DURING PRENATAL VISITS?   At your first prenatal visit, your health care provider will do a physical exam and talk to you about your health history and the health history of your partner and your family. Your health care provider will be able to tell you what date to expect your baby to be born on.  Your first physical exam will include checks of your blood pressure, measurements of your height and weight, and an exam of your pelvic organs. Your health care provider will do a Pap test if you have not had one recently and will do cultures of your cervix to make sure there is no infection.  At each prenatal visit, there will be tests of your blood, urine, blood pressure, weight, and the progress of the baby will be checked.  At your later prenatal visits, your health care provider will check how you are doing and how your baby is developing. You may have a number of tests done as your pregnancy progresses.  Ultrasound exams are often used to check on your baby's growth and health.  You may have more urine and blood tests, as well as special tests, if needed. These may include amniocentesis to examine fluid in the pregnancy sac, stress tests to check how the baby responds to contractions, or a biophysical profile to measure your baby's well-being. Your health care provider will explain the tests and why they are necessary.  You should be tested for high blood sugar (gestational diabetes) between the 24th and 28th weeks of your pregnancy.  You should  discuss with your health care provider your plans to breastfeed or bottle-feed your baby.  Each visit is also a chance for you to learn about staying healthy during pregnancy and to ask questions. Document Released: 01/25/2003 Document Revised: 01/27/2013 Document Reviewed: 04/08/2013 [facility] Patient Information 2015 [place], [place]. This information is not intended to replace advice given to you by your health care provider. Make sure you discuss any questions you have with your health care provider.  Emergency Department Resource Guide 1) Find a Doctor and Pay Out of Pocket Although you won't have to find out who is covered by your insurance plan, it is a good idea to ask around and get recommendations. You will then need to call the office and see if the doctor you have chosen will accept you as a new patient and what types of options they offer for patients who are self-pay. Some doctors offer discounts or will set up payment plans for their patients who do not have insurance, but you will need to ask so you aren't surprised when you get to your appointment.  2) Contact Your Local Health Department Not all health departments have doctors that can see patients for sick visits, but many do, so it is worth a call to see if yours does. If you don't know where your local health department is, you can check in your phone book. The CDC also has a tool to help you locate your state's health department, and many state websites also have listings of all of their local health departments.  3) Find a Walk-in Clinic If your illness is not likely to be very severe or complicated, you may want to try a walk in clinic. These are popping up all over the country in pharmacies, drugstores, and shopping centers. They're usually staffed by nurse practitioners or physician assistants that have been trained to treat common illnesses and complaints. They're usually fairly quick and inexpensive. However, if you have  serious medical issues or chronic medical problems, these are probably not your best option.  No Primary Care Doctor: - Call Health Connect at  [phone] - they can help you locate a primary care doctor that  accepts your insurance, provides certain services, etc. - Physician Referral Service- [phone]  Chronic Pain Problems:  Organization         Address  Phone   Notes  [facility]  [phone] Patients need to be referred by their primary care doctor.   Medication Assistance: Organization         Address  Phone   Notes  [facility] Medication [facility] [address] [place] [place], [place] [address] [phone] --Must be a resident of [facility] -- Must have NO insurance coverage whatsoever (no Medicaid/ Medicare, etc.) -- The pt. MUST have a primary care doctor that directs their care regularly and follows them in the community   [facility]  [phone]   [employer]  [phone]    Agencies that provide inexpensive medical care: Organization         Address  Phone   Notes  [facility]  [phone]   [facility]    [phone]   [facility] [address] [place], [place] [address] [phone]   [facility] [address], [place] [phone]   [facility]anned Parenthood    [phone]   [facility]    [phone]   [facility]  [address], [place] Phone:  [phone], Fax:  [phone] Hours of Operation:  9 am - 6 pm, M-F.  Also accepts Medicaid/Medicare and self-pay.  [facility]  [address], [place] Phone: [phone], Fax: [phone]. Hours of Operation:  8:30 am - 5:30 pm, M-F.  Also accepts Medicaid and self-pay.  [facility] [place] [address] [place] Phone: [phone]   [facility] [address] Natasha Bence [place], [place] [phone], Ext. [phone] Mondays & Thursdays: 7-9 AM.  First 15 patients are seen on a first come, first serve basis.    Medicaid-accepting [facility] Providers:  Organization         Address  Phone   Notes  [facility] [address], [place] [phone] Also accepts self-pay patients.  [facility] [address] Laurell Josephs [place], [place]  [phone]   [facility] [address], [place] [phone]   [facility] [address], [place] [phone]   Renaye Rakers [address], [place]   [phone] Only accepts [place] patients after they have their name applied to their card.   Self-Pay (no insurance) in [facility]:  Organization         Address  Phone   Notes  Sickle Cell Patients, [facility] [address] [place], [place] [phone]   [facility] [address] [place], [place] [phone]   [facility] [place]  [address], [place] [phone]   [facility]/Dr. Osei-Bonsu  [address] [place] or [address], [place] [phone] Phone number for both [place] and [place] locations is the same.  [facility] [address], [place] [phone]   [facility] [address], [place] or [address] [phone]   [facility] [address] [place] [phone], phone; [phone], fax Sees patients 1st and 3rd  Saturday of every month.  Must not qualify for public or private insurance (i.e. Medicaid, Medicare, [place] Health Choice, Veterans' Benefits)  Household income should be no more than 200% of the poverty level The clinic cannot treat you if you are pregnant or think you are pregnant   Sexually transmitted diseases are not treated at the clinic.    Dental Care: Organization         Address  Phone  Notes  [facility] [address] [place], [place] [phone] Accepts children up to age 47 who are enrolled in [place] or [place] Health Choice; pregnant women with a Medicaid card; and children who have applied for Medicaid or [place] Health Choice, but were declined, whose parents can pay a reduced fee at time of service.  [facility] Department of [facility]  [address], [place] [phone] Accepts children up to age 26 who are enrolled in [place] or [place] Health Choice; pregnant women with a Medicaid card; and children who have applied for Medicaid or [place] Health Choice, but were declined, whose parents can pay a reduced fee at time of service.  [facility]  [address] [place], [place] [phone] Patients are seen by appointment only. Walk-ins are not accepted. [facility] will see patients 59 years of age and older. Monday - Tuesday (8am-5pm) Most Wednesdays (8:30-5pm) $30 per visit, cash only  [facility]  [address], [facility] [phone] Patients are seen by appointment only. Walk-ins are not accepted. [facility] will see patients 35 years of age and older. One Wednesday Evening (Monthly: Volunteer Based).  $30 per visit, cash only  [employer]  [phone] for adults; Children under age 37, call Graduate Pediatric Dentistry at [phone]. Children aged 29-14, please call [phone] to request a pediatric application.  Dental services are provided in all areas of dental care including fillings, crowns and bridges, complete and partial dentures, implants, gum treatment, root canals, and extractions. Preventive care is also provided. Treatment is provided to both adults and children. Patients  are selected via a lottery and there is often a waiting list.   [facility] [address], [place]  [phone] [email or web address]   [facility] [address] [place], [place] [phone], Ext. [phone] Second and Fourth Thursday of each month, opens at 6:30 AM; Clinic ends at 9 AM.  Patients are seen on a first-come first-served basis, and a limited number are seen during each clinic.   [facility]  [address] Ether Griffins [place], [place] [phone]   Eligibility Requirements You must have lived in [place], [place], or [place] counties for at least the last three months.   You cannot be eligible for state or federal sponsored [place], including [employer], [place], or [employer].   You generally cannot be eligible for healthcare insurance through your employer.    How to apply: Eligibility screenings are held every Tuesday and Wednesday afternoon from 1:00 pm until 4:00 pm. You do not need an appointment for the interview!  [facility] [address] [place], [place] [phone]   [facility] Health Department  [phone]   [facility] Health Department  [phone]   [facility] Health Department  [phone]

## 2014-02-12 LAB — GC/CHLAMYDIA PROBE AMP
CT PROBE, AMP APTIMA: NEGATIVE
GC Probe RNA: NEGATIVE

## 2014-02-13 LAB — URINE CULTURE: Colony Count: >=100000

## 2014-02-15 ENCOUNTER — Encounter (HOSPITAL_COMMUNITY): Payer: Self-pay | Admitting: *Deleted

## 2014-02-15 ENCOUNTER — Inpatient Hospital Stay (HOSPITAL_COMMUNITY)
Admission: AD | Admit: 2014-02-15 | Discharge: 2014-02-15 | Disposition: A | Discharge status: Home / Self Care | Payer: Medicaid Other | Source: Ambulatory Visit | Attending: Family Medicine | Admitting: Family Medicine

## 2014-02-15 DIAGNOSIS — O26899 Other specified pregnancy related conditions, unspecified trimester: Secondary | ICD-10-CM

## 2014-02-15 DIAGNOSIS — O9989 Other specified diseases and conditions complicating pregnancy, childbirth and the puerperium: Secondary | ICD-10-CM | POA: Diagnosis not present

## 2014-02-15 DIAGNOSIS — R109 Unspecified abdominal pain: Secondary | ICD-10-CM | POA: Insufficient documentation

## 2014-02-15 DIAGNOSIS — Z3A01 Less than 8 weeks gestation of pregnancy: Secondary | ICD-10-CM | POA: Insufficient documentation

## 2014-02-15 LAB — HCG, QUANTITATIVE, PREGNANCY: hCG, Beta Chain, Quant, S: 681 m[IU]/mL — ABNORMAL HIGH (ref <5)

## 2014-02-15 NOTE — MAU Note (Signed)
Follow up blood work pending

## 2014-02-15 NOTE — Discharge Instructions (Signed)
First Trimester of Pregnancy °The first trimester of pregnancy is from week 1 until the end of week 12 (months 1 through 3). A week after a sperm fertilizes an egg, the egg will implant on the wall of the uterus. This embryo will begin to develop into a baby. Genes from you and your partner are forming the baby. The female genes determine whether the baby is a boy or a girl. At 6-8 weeks, the eyes and face are formed, and the heartbeat can be seen on ultrasound. At the end of 12 weeks, all the baby's organs are formed.  °Now that you are pregnant, you will want to do everything you can to have a healthy baby. Two of the most important things are to get good prenatal care and to follow your health care provider's instructions. Prenatal care is all the medical care you receive before the baby's birth. This care will help prevent, find, and treat any problems during the pregnancy and childbirth. °BODY CHANGES °Your body goes through many changes during pregnancy. The changes vary from woman to woman.  °· You may gain or lose a couple of pounds at first. °· You may feel sick to your stomach (nauseous) and throw up (vomit). If the vomiting is uncontrollable, call your health care provider. °· You may tire easily. °· You may develop headaches that can be relieved by medicines approved by your health care provider. °· You may urinate more often. Painful urination may mean you have a bladder infection. °· You may develop heartburn as a result of your pregnancy. °· You may develop constipation because certain hormones are causing the muscles that push waste through your intestines to slow down. °· You may develop hemorrhoids or swollen, bulging veins (varicose veins). °· Your breasts may begin to grow larger and become tender. Your nipples may stick out more, and the tissue that surrounds them (areola) may become darker. °· Your gums may bleed and may be sensitive to brushing and flossing. °· Dark spots or blotches (chloasma,  mask of pregnancy) may develop on your face. This will likely fade after the baby is born. °· Your menstrual periods will stop. °· You may have a loss of appetite. °· You may develop cravings for certain kinds of food. °· You may have changes in your emotions from day to day, such as being excited to be pregnant or being concerned that something may go wrong with the pregnancy and baby. °· You may have more vivid and strange dreams. °· You may have changes in your hair. These can include thickening of your hair, rapid growth, and changes in texture. Some women also have hair loss during or after pregnancy, or hair that feels dry or thin. Your hair will most likely return to normal after your baby is born. °WHAT TO EXPECT AT YOUR PRENATAL VISITS °During a routine prenatal visit: °· You will be weighed to make sure you and the baby are growing normally. °· Your blood pressure will be taken. °· Your abdomen will be measured to track your baby's growth. °· The fetal heartbeat will be listened to starting around week 10 or 12 of your pregnancy. °· Test results from any previous visits will be discussed. °Your health care provider may ask you: °· How you are feeling. °· If you are feeling the baby move. °· If you have had any abnormal symptoms, such as leaking fluid, bleeding, severe headaches, or abdominal cramping. °· If you have any questions. °Other tests   that may be performed during your first trimester include: °· Blood tests to find your blood type and to check for the presence of any previous infections. They will also be used to check for low iron levels (anemia) and Rh antibodies. Later in the pregnancy, blood tests for diabetes will be done along with other tests if problems develop. °· Urine tests to check for infections, diabetes, or protein in the urine. °· An ultrasound to confirm the proper growth and development of the baby. °· An amniocentesis to check for possible genetic problems. °· Fetal screens for  spina bifida and Down syndrome. °· You may need other tests to make sure you and the baby are doing well. °HOME CARE INSTRUCTIONS  °Medicines °· Follow your health care provider's instructions regarding medicine use. Specific medicines may be either safe or unsafe to take during pregnancy. °· Take your prenatal vitamins as directed. °· If you develop constipation, try taking a stool softener if your health care provider approves. °Diet °· Eat regular, well-balanced meals. Choose a variety of foods, such as meat or vegetable-based protein, fish, milk and low-fat dairy products, vegetables, fruits, and whole grain breads and cereals. Your health care provider will help you determine the amount of weight gain that is right for you. °· Avoid raw meat and uncooked cheese. These carry germs that can cause birth defects in the baby. °· Eating four or five small meals rather than three large meals a day may help relieve nausea and vomiting. If you start to feel nauseous, eating a few soda crackers can be helpful. Drinking liquids between meals instead of during meals also seems to help nausea and vomiting. °· If you develop constipation, eat more high-fiber foods, such as fresh vegetables or fruit and whole grains. Drink enough fluids to keep your urine clear or pale yellow. °Activity and Exercise °· Exercise only as directed by your health care provider. Exercising will help you: °¨ Control your weight. °¨ Stay in shape. °¨ Be prepared for labor and delivery. °· Experiencing pain or cramping in the lower abdomen or low back is a good sign that you should stop exercising. Check with your health care provider before continuing normal exercises. °· Try to avoid standing for long periods of time. Move your legs often if you must stand in one place for a long time. °· Avoid heavy lifting. °· Wear low-heeled shoes, and practice good posture. °· You may continue to have sex unless your health care provider directs you  otherwise. °Relief of Pain or Discomfort °· Wear a good support bra for breast tenderness.   °· Take warm sitz baths to soothe any pain or discomfort caused by hemorrhoids. Use hemorrhoid cream if your health care provider approves.   °· Rest with your legs elevated if you have leg cramps or low back pain. °· If you develop varicose veins in your legs, wear support hose. Elevate your feet for 15 minutes, 3-4 times a day. Limit salt in your diet. °Prenatal Care °· Schedule your prenatal visits by the twelfth week of pregnancy. They are usually scheduled monthly at first, then more often in the last 2 months before delivery. °· Write down your questions. Take them to your prenatal visits. °· Keep all your prenatal visits as directed by your health care provider. °Safety °· Wear your seat belt at all times when driving. °· Make a list of emergency phone numbers, including numbers for family, friends, the hospital, and police and fire departments. °General Tips °·   Ask your health care provider for a referral to a local prenatal education class. Begin classes no later than at the beginning of month 6 of your pregnancy.  Ask for help if you have counseling or nutritional needs during pregnancy. Your health care provider can offer advice or refer you to specialists for help with various needs.  Do not use hot tubs, steam rooms, or saunas.  Do not douche or use tampons or scented sanitary pads.  Do not cross your legs for long periods of time.  Avoid cat litter boxes and soil used by cats. These carry germs that can cause birth defects in the baby and possibly loss of the fetus by miscarriage or stillbirth.  Avoid all smoking, herbs, alcohol, and medicines not prescribed by your health care provider. Chemicals in these affect the formation and growth of the baby.  Schedule a dentist appointment. At home, brush your teeth with a soft toothbrush and be gentle when you floss. SEEK MEDICAL CARE IF:   You have  dizziness.  You have mild pelvic cramps, pelvic pressure, or nagging pain in the abdominal area.  You have persistent nausea, vomiting, or diarrhea.  You have a bad smelling vaginal discharge.  You have pain with urination.  You notice increased swelling in your face, hands, legs, or ankles. SEEK IMMEDIATE MEDICAL CARE IF:   You have a fever.  You are leaking fluid from your vagina.  You have spotting or bleeding from your vagina.  You have severe abdominal cramping or pain.  You have rapid weight gain or loss.  You vomit blood or material that looks like coffee grounds.  You are exposed to [place] measles and have never had them.  You are exposed to fifth disease or chickenpox.  You develop a severe headache.  You have shortness of breath.  You have any kind of trauma, such as from a fall or a car accident. Document Released: 01/16/2001 Document Revised: 06/08/2013 Document Reviewed: 12/02/2012 [facility] Patient Information 2015 [employer], [place]. This information is not intended to replace advice given to you by your health care provider. Make sure you discuss any questions you have with your health care provider. Pelvic Rest Pelvic rest is sometimes recommended for women when:   The placenta is partially or completely covering the opening of the cervix (placenta previa).  There is bleeding between the uterine wall and the amniotic sac in the first trimester (subchorionic hemorrhage).  The cervix begins to open without labor starting (incompetent cervix, cervical insufficiency).  The labor is too early (preterm labor). HOME CARE INSTRUCTIONS  Do not have sexual intercourse, stimulation, or an orgasm.  Do not use tampons, douche, or put anything in the vagina.  Do not lift anything over 10 pounds (4.5 kg).  Avoid strenuous activity or straining your pelvic muscles. SEEK MEDICAL CARE IF:  You have any vaginal bleeding during pregnancy. Treat this as a potential  emergency.  You have cramping pain felt low in the stomach (stronger than menstrual cramps).  You notice vaginal discharge (watery, mucus, or bloody).  You have a low, dull backache.  There are regular contractions or uterine tightening. SEEK IMMEDIATE MEDICAL CARE IF: You have vaginal bleeding and have placenta previa.  Document Released: 05/19/2010 Document Revised: 04/16/2011 Document Reviewed: 05/19/2010 [facility] Patient Information 2015 [place], [place]. This information is not intended to replace advice given to you by your health care provider. Make sure you discuss any questions you have with your health care provider.  Return  to [facility] clinic on Wednesday  January 13  1p-3p and you will be called with time for Ultrasound for Monday Janaury 18th, will be seen in MAU after [place]

## 2014-02-15 NOTE — MAU Note (Signed)
[facility], Dx pyelo, and UPT +, US and labs for possible ectopic, told to follow up here

## 2014-02-15 NOTE — MAU Provider Note (Signed)
Subjective:  Ms. Christy Bell is a  [age] y.o. female G1P0 at 3368w4d who presents for a follow up beta hcg. [facility] for UTI/ Pyelonephritis. She had an incidental + UPT. She had labs and an US that did not show anything in the uterus. She was instructed to call the [facility] to schedule a follow up appointment. She called the clinic and the clinic was closed so she came to MAU for evaluation.  Denies pain, Denies vaginal bleeding   Says UTI symptoms have significantly improved. She is taking her antibiotic as prescribed.   RN.  [facility], Dx pyelo, and UPT +, US and labs for possible ectopic, told to follow up here    Objective:  GENERAL: Well-developed, well-nourished female in no acute distress.  HEENT: Normocephalic, atraumatic.   LUNGS: Effort normal SKIN: Warm, dry and without erythema PSYCH: Normal mood and affect  Filed Vitals:   02/15/14 1546  BP: 117/62  Pulse: 77  Temp: 98.6 F (37 C)  Resp: 18   CLINICAL DATA: Back pain. Estimated gestational age by LMP is 3 weeks 0 days. Quantitative beta HCG is 93.  EXAM: OBSTETRIC <14 WK US AND TRANSVAGINAL OB US  TECHNIQUE: Both transabdominal and transvaginal ultrasound examinations were performed for complete evaluation of the gestation as well as the maternal uterus, adnexal regions, and pelvic cul-de-sac. Transvaginal technique was performed to assess early pregnancy.  COMPARISON: None.  FINDINGS: Intrauterine gestational sac: No intrauterine gestational sac is identified.  Yolk sac: Not identified.  Embryo: Not identified.  Cardiac Activity: Not identified.  Maternal uterus/adnexae: Uterus is anteverted. No myometrial mass lesions demonstrated. Endometrial stripe is thickened about 19 mm. No endometrial fluid is demonstrated. The right ovary is visualized and appears normal. No abnormal adnexal masses. Left ovary is not visualized due to overlying  bowel gas. No free pelvic fluid is demonstrated.  IMPRESSION: No intrauterine gestational sac, yolk sac, or fetal pole identified. Differential considerations include intrauterine pregnancy too early to be sonographically visualized, missed abortion, or ectopic pregnancy. Followup ultrasound is recommended in 10-14 days for further evaluation.   Electronically Signed  By: Burman NievesWilliam Stevens M.D.  On: 02/11/2014 03:43  MDM: Beta hcg 1/11: 681 Beta hcg 1/7: 93  Assessment:  1. Abdominal pain in pregnancy   2.      Pregnancy of unknown location; cannot rule out ectopic pregnancy   Plan:  Discharge home in stable condition [facility] beta hcg and ABO Repeat US in 7 days; scheduled and US will call to confirm time Return to MAU if symptoms worsen Pelvic rest    Christy HansenJennifer Irene Rasch, NP 02/15/2014 3:59 PM

## 2014-02-16 ENCOUNTER — Telehealth (HOSPITAL_COMMUNITY): Payer: Self-pay

## 2014-02-16 NOTE — Telephone Encounter (Signed)
Post ED Visit - Positive Culture Follow-up  Culture report reviewed by antimicrobial stewardship pharmacist: []  Wes Dulaney, Pharm.D., BCPS [x]  Celedonio MiyamotoJeremy Frens, Pharm.D., BCPS []  Georgina PillionElizabeth Martin, [address]Ph[address] BCPS []  ValliantMinh Pham, [address]Pharm[address]D., BCPS, AAHIVP []  Estella HuskMichelle Turner, Pharm.D., BCPS, AAHIVP []  Elder CyphersLorie Poole, [address]Pharm[address]D., BCPS  Positive Urine culture, 100,000 colonies -> Staph. Species Treated with Cephalexin, organism sensitive to the same and no further patient follow-up is required at this time.

## 2014-02-17 ENCOUNTER — Other Ambulatory Visit: Payer: Self-pay

## 2014-02-17 DIAGNOSIS — Z3490 Encounter for supervision of normal pregnancy, unspecified, unspecified trimester: Secondary | ICD-10-CM

## 2014-02-17 LAB — ABO AND RH

## 2014-02-17 LAB — ANTIBODY SCREEN

## 2014-02-17 LAB — HCG, QUANTITATIVE, PREGNANCY: HCG, BETA CHAIN, QUANT, S: 2168 m[IU]/mL

## 2014-02-22 ENCOUNTER — Other Ambulatory Visit: Payer: Self-pay | Admitting: Medical

## 2014-02-22 ENCOUNTER — Encounter (HOSPITAL_COMMUNITY): Payer: Self-pay | Admitting: Medical

## 2014-02-22 ENCOUNTER — Encounter: Payer: Self-pay | Admitting: Medical

## 2014-02-22 ENCOUNTER — Ambulatory Visit (HOSPITAL_COMMUNITY)
Admission: RE | Admit: 2014-02-22 | Discharge: 2014-02-22 | Disposition: A | Discharge status: Home / Self Care | Payer: Medicaid Other | Source: Ambulatory Visit | Attending: Obstetrics and Gynecology | Admitting: Obstetrics and Gynecology

## 2014-02-22 DIAGNOSIS — N831 Corpus luteum cyst: Secondary | ICD-10-CM | POA: Diagnosis not present

## 2014-02-22 DIAGNOSIS — Z3A01 Less than 8 weeks gestation of pregnancy: Secondary | ICD-10-CM | POA: Insufficient documentation

## 2014-02-22 DIAGNOSIS — O26899 Other specified pregnancy related conditions, unspecified trimester: Secondary | ICD-10-CM

## 2014-02-22 DIAGNOSIS — O9989 Other specified diseases and conditions complicating pregnancy, childbirth and the puerperium: Secondary | ICD-10-CM | POA: Diagnosis present

## 2014-02-22 DIAGNOSIS — O3481 Maternal care for other abnormalities of pelvic organs, first trimester: Secondary | ICD-10-CM | POA: Insufficient documentation

## 2014-02-22 DIAGNOSIS — Z36 Encounter for antenatal screening of mother: Secondary | ICD-10-CM | POA: Insufficient documentation

## 2014-02-22 DIAGNOSIS — R109 Unspecified abdominal pain: Secondary | ICD-10-CM

## 2014-02-22 IMAGING — US US OB TRANSVAGINAL
1 series · 14 of 28 positions shown · non-contrast
Comparison: 02/11/2014.

CLINICAL DATA: Followup. Positive pregnancy test. Rising
quantitative beta HCG.

EXAM:
TRANSVAGINAL OB ULTRASOUND
TECHNIQUE: Transvaginal ultrasound was performed for complete evaluation of the
gestation as well as the maternal uterus, adnexal regions, and
pelvic cul-de-sac.

[Series 1: us ob transvaginal · 14 of 34 slices shown]
[im 2/34]
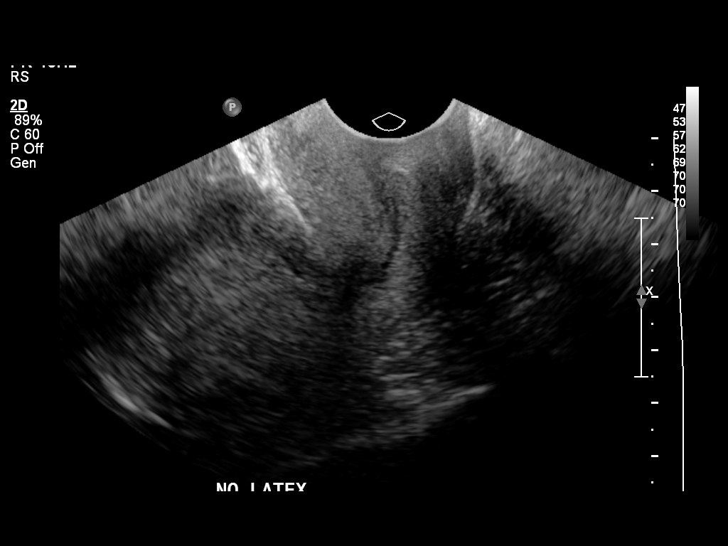
[im 4/34]
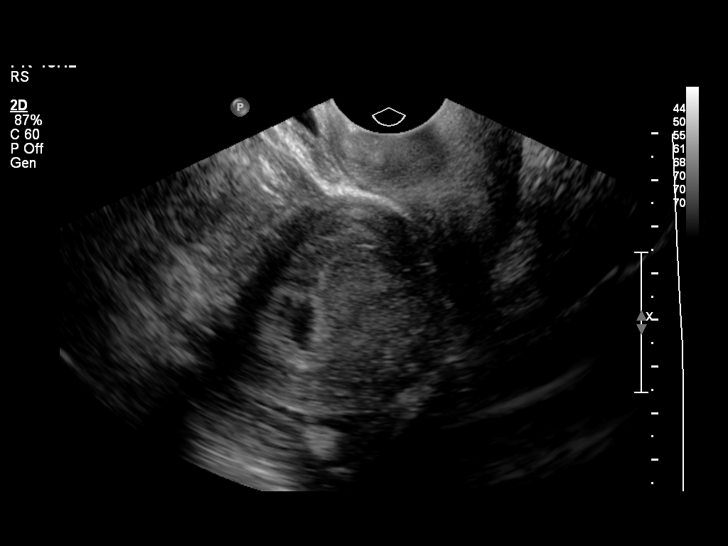
[im 7/34]
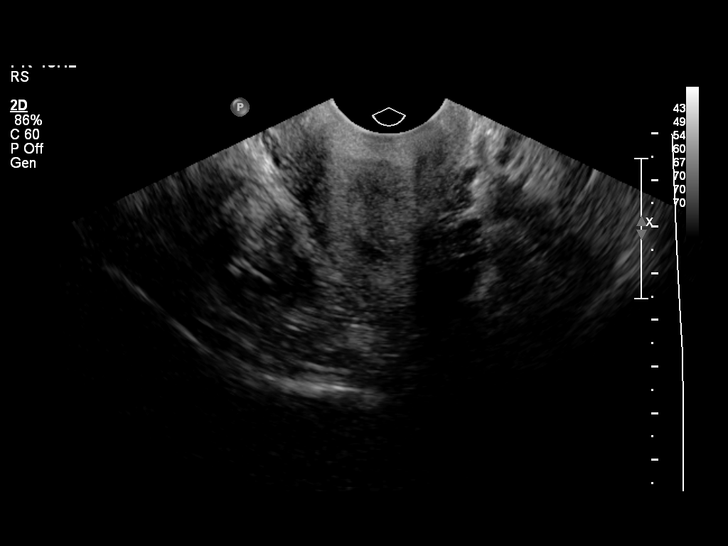
[im 9/34]
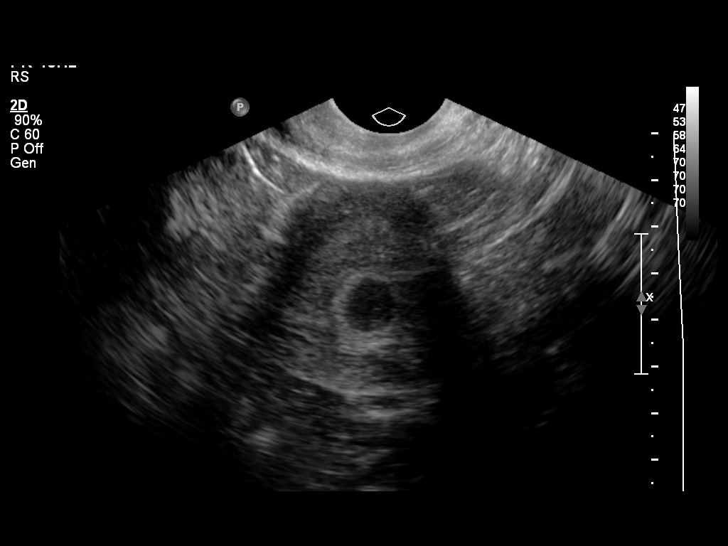
[im 12/34]
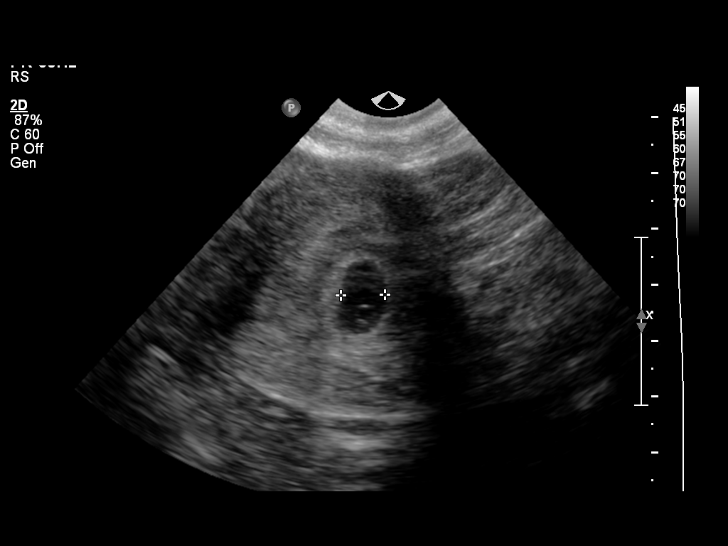
[im 14/34]
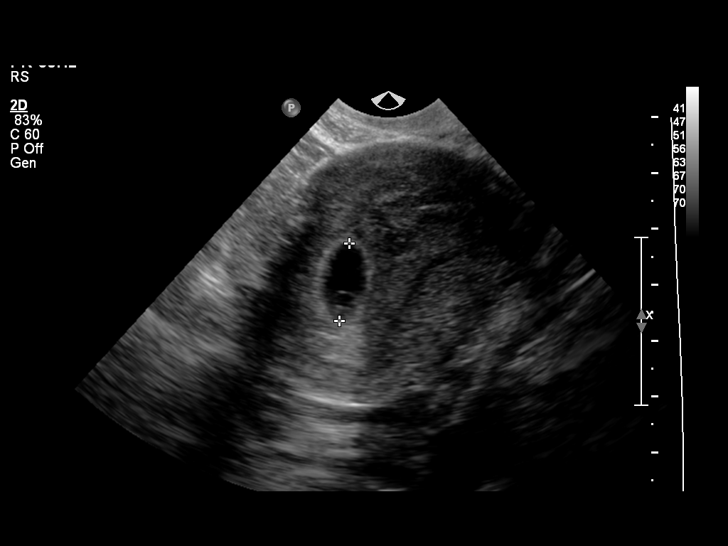
[im 16/34]
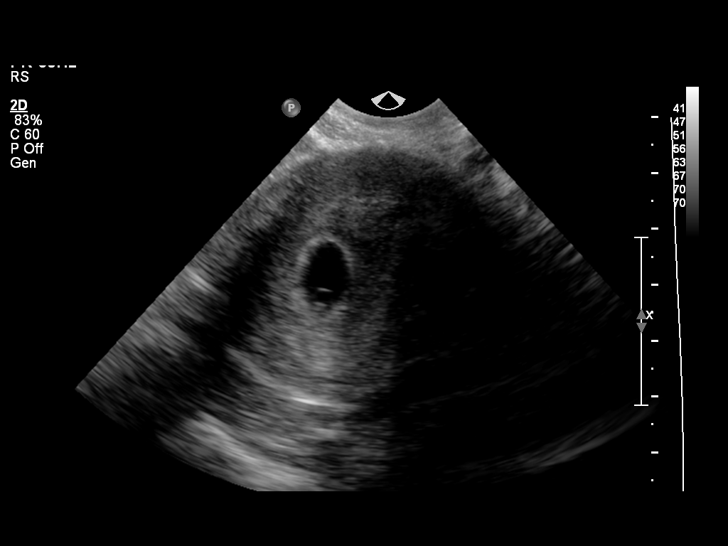
[im 19/34]
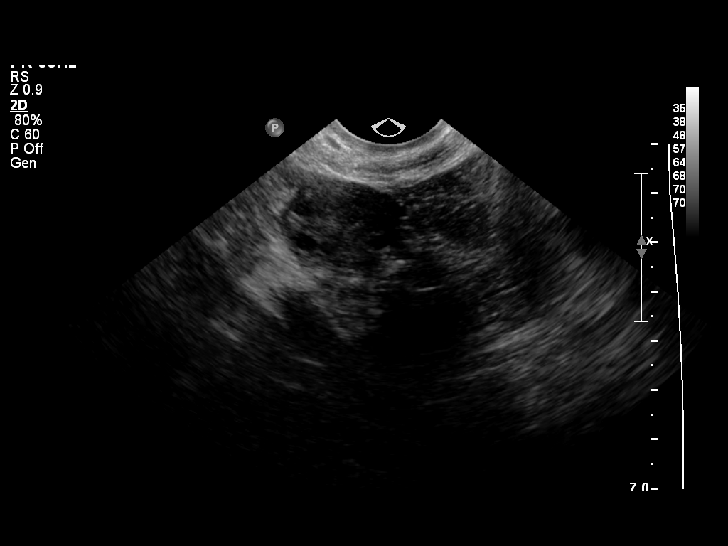
[im 21/34]
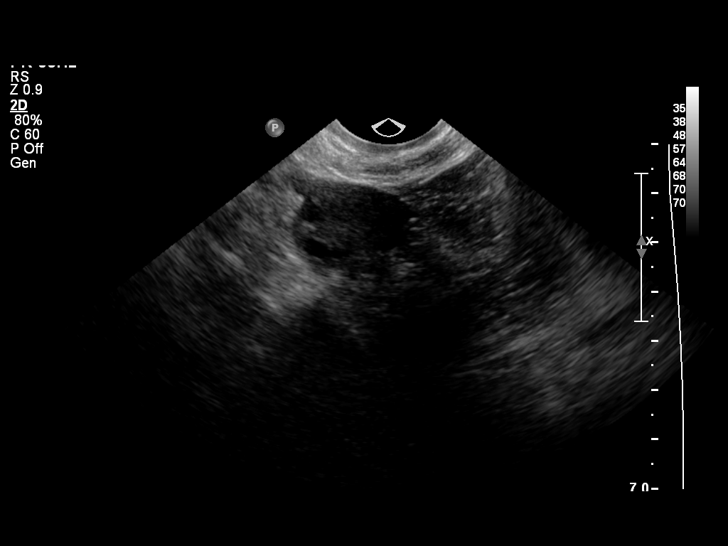
[im 24/34]
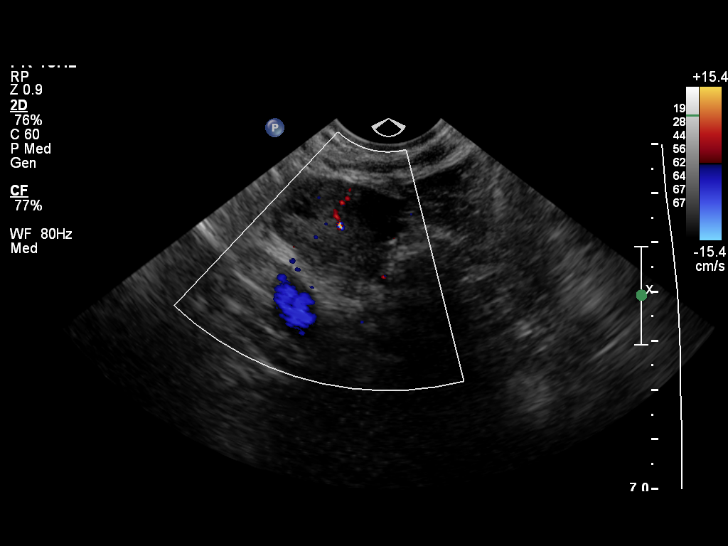
[im 26/34]
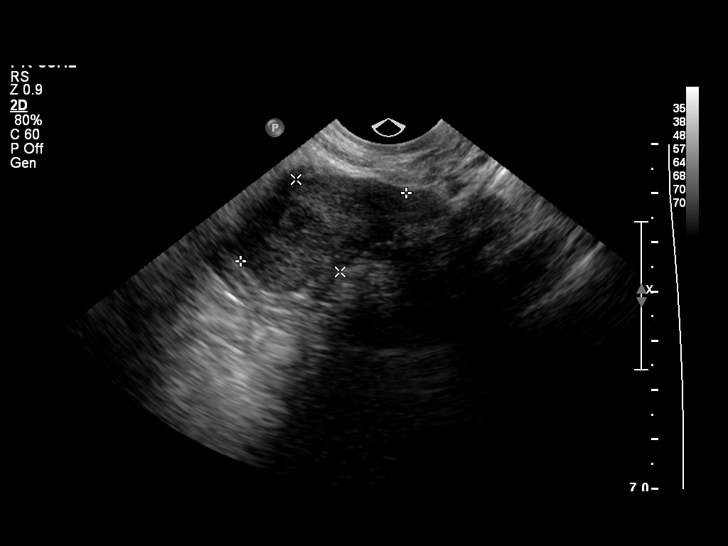
[im 29/34]
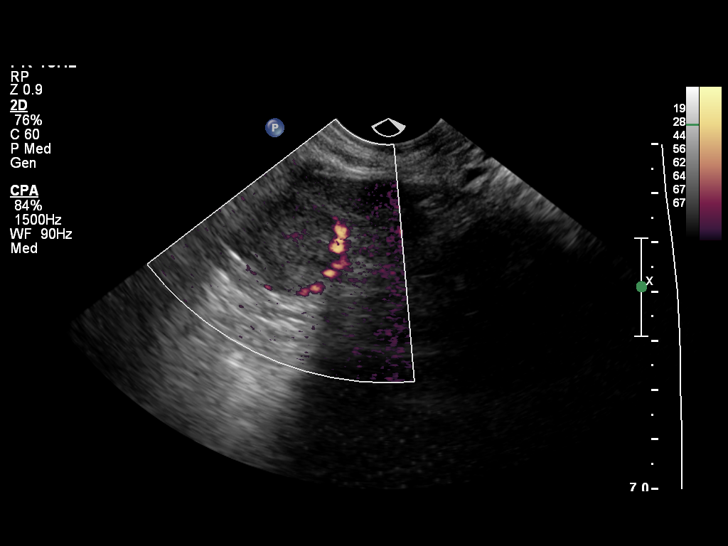
[im 31/34]
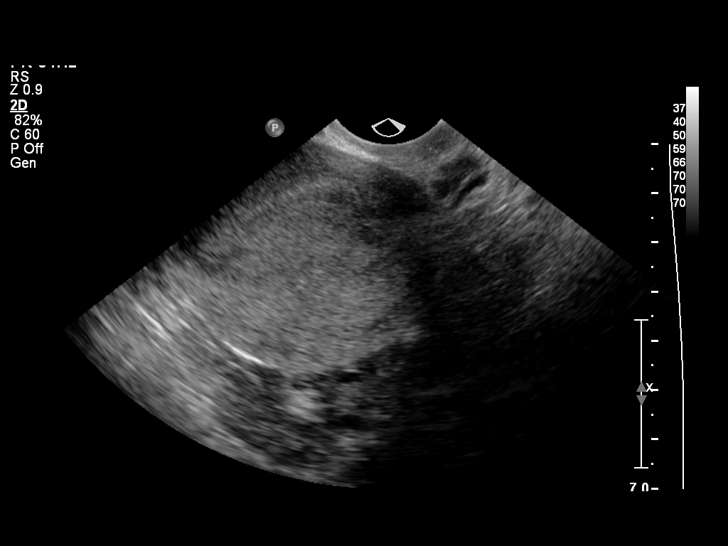
[im 34/34]
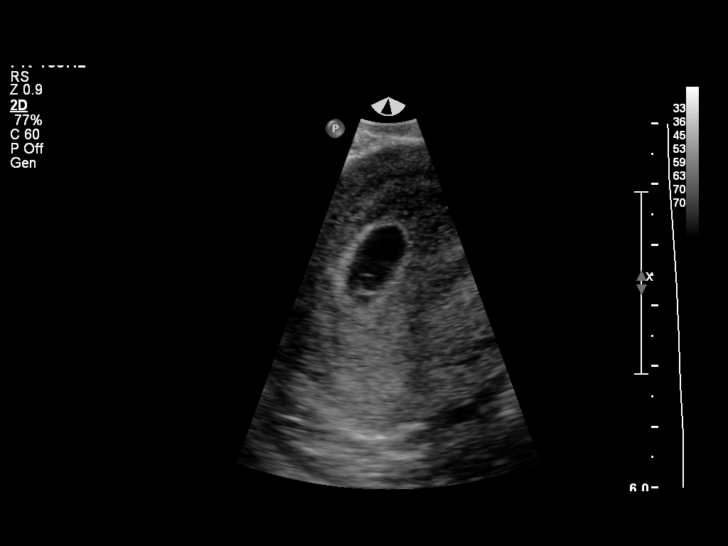

[14 of 28 positions shown; findings below may reference images not displayed]

FINDINGS: Intrauterine gestational sac: Single

Yolk sac:  Yes

Embryo:  No

Cardiac Activity: No

Heart Rate:  Not applicable bpm

MSD:  9.8  mm   5 w   5  d

Maternal uterus/adnexae:

Subchorionic hemorrhage: None

Right ovary: Contains a corpus luteal cyst

Left ovary: Normal

Other :None

Free fluid:  None
IMPRESSION: Intrauterine gestational sac with yolk sac, but no fetal pole, or
cardiac activity yet visualized. Recommend follow-up quantitative
B-HCG levels and follow-up US in 14 days to confirm and assess
viability. This recommendation follows SRU consensus guidelines:
Diagnostic Criteria for Nonviable Pregnancy Early in the First
Trimester. N Engl J Med 6308; [DATE].

## 2014-08-04 ENCOUNTER — Encounter (HOSPITAL_COMMUNITY): Payer: Self-pay | Admitting: Family Medicine

## 2014-08-04 ENCOUNTER — Emergency Department (HOSPITAL_COMMUNITY)
Admission: EM | Admit: 2014-08-04 | Discharge: 2014-08-04 | Disposition: A | Discharge status: Home / Self Care | Payer: Medicaid Other | Attending: Emergency Medicine | Admitting: Emergency Medicine

## 2014-08-04 DIAGNOSIS — F329 Major depressive disorder, single episode, unspecified: Secondary | ICD-10-CM | POA: Insufficient documentation

## 2014-08-04 DIAGNOSIS — K219 Gastro-esophageal reflux disease without esophagitis: Secondary | ICD-10-CM | POA: Diagnosis not present

## 2014-08-04 DIAGNOSIS — Z79899 Other long term (current) drug therapy: Secondary | ICD-10-CM | POA: Diagnosis not present

## 2014-08-04 DIAGNOSIS — R51 Headache: Secondary | ICD-10-CM | POA: Diagnosis not present

## 2014-08-04 DIAGNOSIS — R112 Nausea with vomiting, unspecified: Secondary | ICD-10-CM | POA: Diagnosis present

## 2014-08-04 DIAGNOSIS — Z792 Long term (current) use of antibiotics: Secondary | ICD-10-CM | POA: Insufficient documentation

## 2014-08-04 DIAGNOSIS — Z720 Tobacco use: Secondary | ICD-10-CM | POA: Insufficient documentation

## 2014-08-04 DIAGNOSIS — J45909 Unspecified asthma, uncomplicated: Secondary | ICD-10-CM | POA: Insufficient documentation

## 2014-08-04 MED ORDER — FAMOTIDINE 20 MG PO TABS: 20.0000 mg | ORAL_TABLET | Freq: Two times a day (BID) | ORAL | Status: DC

## 2014-08-04 NOTE — ED Provider Notes (Signed)
CSN: [id number]     Arrival date & time 08/04/14  1831 History  This chart was scribed for Crawford Givens, PA-C, working with Pricilla Loveless, MD by Chestine Spore, ED Scribe. The patient was seen in room TR05C/TR05C at 6:51 PM.    Chief Complaint  Patient presents with  . Gastrophageal Reflux      The history is provided by the patient. No language interpreter was used.    HPI Comments: Christy Bell is a 24 y.o. female with a medical hx of GERD who presents to the Emergency Department complaining of intermittent worsening sore throat onset 1 week. She reports that she feels as if she has strep. Pt has felt warm as if she had a fever in the last 48 hours with no documented temperature. She states that she is having associated symptoms of sore throat, belching, acid reflux, vomiting last night, mild nausea during the day. She reports she's been taking Zantac whenever she has symptoms which provides her with some relief of her symptoms.  She denies trouble swallowing, fevers, chills, appetite change, abdominal pain, rash, neck pain, neck stiffness, and any other symptoms. Patient's last menstrual period was 01/21/2014. Pt is currently [redacted] weeks pregnant. Patient is followed by [facility]. She has not seen her OB/GYN for these symptoms.  Past Medical History  Diagnosis Date  . Depression   . Asthma    History reviewed. No pertinent past surgical history. History reviewed. No pertinent family history. History  Substance Use Topics  . Smoking status: Current Every Day Smoker  . Smokeless tobacco: Not on file  . Alcohol Use: Yes   OB History    Gravida Para Term Preterm AB TAB SAB Ectopic Multiple Living   1              Review of Systems  Constitutional: Negative for appetite change.  HENT: Positive for sore throat. Negative for ear pain, postnasal drip and trouble swallowing.   Eyes: Negative for redness and visual disturbance.  Respiratory: Positive for cough. Negative  for shortness of breath and wheezing.   Gastrointestinal: Positive for nausea and vomiting. Negative for abdominal pain and blood in stool.  Musculoskeletal: Negative for myalgias, neck pain and neck stiffness.  Neurological: Positive for headaches. Negative for weakness and light-headedness.      Allergies  Review of patient's allergies indicates no known allergies.  Home Medications   Prior to Admission medications   Medication Sig Start Date End Date Taking? Authorizing Provider  acetaminophen (TYLENOL) 500 MG tablet Take 1 tablet (500 mg total) by mouth every 6 (six) hours as needed. 02/11/14   Antony Madura, PA-C  cephALEXin (KEFLEX) 500 MG capsule Take 1 capsule (500 mg total) by mouth 3 (three) times daily. 02/11/14   Antony Madura, PA-C  famotidine (PEPCID) 20 MG tablet Take 1 tablet (20 mg total) by mouth 2 (two) times daily. 08/04/14   Everlene Farrier, PA-C  FLUoxetine (PROZAC) 20 MG capsule Take 40 mg by mouth daily.    Historical Provider, MD  ibuprofen (ADVIL,MOTRIN) 200 MG tablet Take 200 mg by mouth every 6 (six) hours as needed for moderate pain.    Historical Provider, MD   BP 130/63 mmHg  Pulse 93  Temp(Src) 98.5 F (36.9 C) (Oral)  Resp 16  Ht 5\' 3"  (1.6 m)  Wt 160 lb (72.576 kg)  BMI 28.35 kg/m2  SpO2 100%  LMP 01/21/2014 Physical Exam  Constitutional: She is oriented to person, place, and time. She  appears well-developed and well-nourished. No distress.  Nontoxic appearing.  HENT:  Head: Normocephalic and atraumatic.  Right Ear: Tympanic membrane, external ear and ear canal normal.  Left Ear: Tympanic membrane, external ear and ear canal normal.  Mouth/Throat: Uvula is midline and oropharynx is clear and moist. No oropharyngeal exudate, posterior oropharyngeal edema or posterior oropharyngeal erythema.  No oropharyngeal erythema or edema. No tonsillar hypertrophy or exudates. Soft palate raises symmetrically. Uvula midline without edema.  Eyes: Conjunctivae and EOM  are normal. Pupils are equal, round, and reactive to light. Right eye exhibits no discharge. Left eye exhibits no discharge.  Neck: Normal range of motion. Neck supple. No JVD present. No tracheal deviation present.  Cardiovascular: Normal rate, regular rhythm, normal heart sounds and intact distal pulses.   Pulmonary/Chest: Effort normal and breath sounds normal. No respiratory distress. She has no wheezes. She has no rales.  Abdominal: Soft. There is no tenderness.  Gravid abdomen. Abdomen is nontender to palpation.  Lymphadenopathy:    She has no cervical adenopathy.  Neurological: She is alert and oriented to person, place, and time. Coordination normal.  Skin: Skin is warm and dry. No rash noted. She is not diaphoretic.  Psychiatric: She has a normal mood and affect. Her behavior is normal.  Nursing note and vitals reviewed.   ED Course  Procedures (including critical care time) DIAGNOSTIC STUDIES: Oxygen Saturation is 100% on RA, nl by my interpretation.    COORDINATION OF CARE: 7:01 PM-Discussed treatment plan which includes f/u with OB-GYN  with pt at bedside and pt agreed to plan.   Labs Review Labs Reviewed - No data to display  Imaging Review No results found.   EKG Interpretation None      Filed Vitals:   08/04/14 1842  BP: 130/63  Pulse: 93  Temp: 98.5 F (36.9 C)  TempSrc: Oral  Resp: 16  Height: 5\' 3"  (1.6 m)  Weight: 160 lb (72.576 kg)  SpO2: 100%     MDM   Meds given in ED:  Medications - No data to display  New Prescriptions   FAMOTIDINE (PEPCID) 20 MG TABLET    Take 1 tablet (20 mg total) by mouth 2 (two) times daily.    Final diagnoses:  Gastroesophageal reflux disease, esophagitis presence not specified   This is a 24 year old female during her first pregnancy presents to the emergency department complaining of increased acid reflux symptoms, associated with sore throat, cough, and intermittent epigastric pain. Currently she denies  abdominal pain or nausea. Patient reports she had bad acid reflux prior to becoming pregnant, but this has worsened. Ports taking ranitidine when she has symptoms but not proactively. On examination is afebrile and nontoxic-appearing. She has a gravid abdomen and her abdomen is soft and nontender to palpation. Her oropharynx is clear. No oropharyngeal erythema or edema. No tonsillar hypertrophy or exudates. Plan to switch the patient to Pepcid and have her take on a regular basis rather than only when her symptoms appear. I also educated the patient on food choices to help prevent acid reflux. I also advised patient she can get prop herself upright in bed to help prevent acid reflux symptoms at night. I advised the patient to follow-up with her OB/GYN as planned next week. Strict return precautions provided. I advised the patient to follow-up with their primary care provider this week. I advised the patient to return to the emergency department with new or worsening symptoms or new concerns. The patient verbalized understanding and agreement  with plan.    I personally performed the services described in this documentation, which was scribed in my presence. The recorded information has been reviewed and is accurate.     Everlene Farrier, PA-C 08/04/14 1924  Pricilla Loveless, MD 08/05/14 [phone]

## 2014-08-04 NOTE — Discharge Instructions (Signed)
Gastroesophageal Reflux Disease, Adult  Gastroesophageal reflux disease (GERD) happens when acid from your stomach flows up into the esophagus. When acid comes in contact with the esophagus, the acid causes soreness (inflammation) in the esophagus. Over time, GERD may create small holes (ulcers) in the lining of the esophagus.  CAUSES   · Increased body weight. This puts pressure on the stomach, making acid rise from the stomach into the esophagus.  · Smoking. This increases acid production in the stomach.  · Drinking alcohol. This causes decreased pressure in the lower esophageal sphincter (valve or ring of muscle between the esophagus and stomach), allowing acid from the stomach into the esophagus.  · Late evening meals and a full stomach. This increases pressure and acid production in the stomach.  · A malformed lower esophageal sphincter.  Sometimes, no cause is found.  SYMPTOMS   · Burning pain in the lower part of the mid-chest behind the breastbone and in the mid-stomach area. This may occur twice a week or more often.  · Trouble swallowing.  · Sore throat.  · Dry cough.  · Asthma-like symptoms including chest tightness, shortness of breath, or wheezing.  DIAGNOSIS   Your caregiver may be able to diagnose GERD based on your symptoms. In some cases, X-rays and other tests may be done to check for complications or to check the condition of your stomach and esophagus.  TREATMENT   Your caregiver may recommend over-the-counter or prescription medicines to help decrease acid production. Ask your caregiver before starting or adding any new medicines.   HOME CARE INSTRUCTIONS   · Change the factors that you can control. Ask your caregiver for guidance concerning weight loss, quitting smoking, and alcohol consumption.  · Avoid foods and drinks that make your symptoms worse, such as:  ¨ Caffeine or alcoholic drinks.  ¨ Chocolate.  ¨ Peppermint or mint flavorings.  ¨ Garlic and onions.  ¨ Spicy foods.  ¨ Citrus fruits,  such as oranges, lemons, or limes.  ¨ Tomato-based foods such as sauce, chili, salsa, and pizza.  ¨ Fried and fatty foods.  · Avoid lying down for the 3 hours prior to your bedtime or prior to taking a nap.  · Eat small, frequent meals instead of large meals.  · Wear loose-fitting clothing. Do not wear anything tight around your waist that causes pressure on your stomach.  · Raise the head of your bed 6 to 8 inches with wood blocks to help you sleep. Extra pillows will not help.  · Only take over-the-counter or prescription medicines for pain, discomfort, or fever as directed by your caregiver.  · Do not take aspirin, ibuprofen, or other nonsteroidal anti-inflammatory drugs (NSAIDs).  SEEK IMMEDIATE MEDICAL CARE IF:   · You have pain in your arms, neck, jaw, teeth, or back.  · Your pain increases or changes in intensity or duration.  · You develop nausea, vomiting, or sweating (diaphoresis).  · You develop shortness of breath, or you faint.  · Your vomit is green, yellow, black, or looks like coffee grounds or blood.  · Your stool is red, bloody, or black.  These symptoms could be signs of other problems, such as heart disease, gastric bleeding, or esophageal bleeding.  MAKE SURE YOU:   · Understand these instructions.  · Will watch your condition.  · Will get help right away if you are not doing well or get worse.  Document Released: 11/01/2004 Document Revised: 04/16/2011 Document Reviewed: 08/11/2010  ExitCare® Patient   Information ©2015 [employer]. This information is not intended to replace advice given to you by your health care provider. Make sure you discuss any questions you have with your health care provider.  Food Choices for Gastroesophageal Reflux Disease  When you have gastroesophageal reflux disease (GERD), the foods you eat and your eating habits are very important. Choosing the right foods can help ease the discomfort of GERD.  WHAT GENERAL GUIDELINES DO I NEED TO FOLLOW?  · Choose fruits,  vegetables, whole grains, low-fat dairy products, and low-fat meat, fish, and poultry.  · Limit fats such as oils, salad dressings, butter, nuts, and avocado.  · Keep a food diary to identify foods that cause symptoms.  · Avoid foods that cause reflux. These may be different for different people.  · Eat frequent small meals instead of three large meals each day.  · Eat your meals slowly, in a relaxed setting.  · Limit fried foods.  · Cook foods using methods other than frying.  · Avoid drinking alcohol.  · Avoid drinking large amounts of liquids with your meals.  · Avoid bending over or lying down until 2-3 hours after eating.  WHAT FOODS ARE NOT RECOMMENDED?  The following are some foods and drinks that may worsen your symptoms:  Vegetables  Tomatoes. Tomato juice. Tomato and spaghetti sauce. Chili peppers. Onion and garlic. Horseradish.  Fruits  Oranges, grapefruit, and lemon (fruit and juice).  Meats  High-fat meats, fish, and poultry. This includes hot dogs, ribs, ham, sausage, salami, and bacon.  Dairy  Whole milk and chocolate milk. Sour cream. Cream. Butter. Ice cream. Cream cheese.   Beverages  Coffee and tea, with or without caffeine. Carbonated beverages or energy drinks.  Condiments  Hot sauce. Barbecue sauce.   Sweets/Desserts  Chocolate and cocoa. Donuts. Peppermint and spearmint.  Fats and Oils  High-fat foods, including French fries and potato chips.  Other  Vinegar. Strong spices, such as black pepper, white pepper, red pepper, cayenne, curry powder, cloves, ginger, and chili powder.  The items listed above may not be a complete list of foods and beverages to avoid. Contact your dietitian for more information.  Document Released: 01/22/2005 Document Revised: 01/27/2013 Document Reviewed: 11/26/2012  ExitCare® Patient Information ©2015 [employer]. This information is not intended to replace advice given to you by your health care provider. Make sure you discuss any questions you have with your  health care provider.

## 2014-08-04 NOTE — ED Notes (Signed)
Pt here sts that she has the feeling of something stuck in her throat and sore throat. sts she took antacid and helped her stomach but not throat. No redness or swelling noted to throat.

## 2014-09-04 ENCOUNTER — Inpatient Hospital Stay (HOSPITAL_BASED_OUTPATIENT_CLINIC_OR_DEPARTMENT_OTHER)
Admission: AD | Admit: 2014-09-04 | Discharge: 2014-09-12 | DRG: 766 | Disposition: A | Discharge status: Home / Self Care | Payer: Medicaid Other | Source: Ambulatory Visit | Attending: Obstetrics & Gynecology | Admitting: Obstetrics & Gynecology

## 2014-09-04 ENCOUNTER — Encounter (HOSPITAL_BASED_OUTPATIENT_CLINIC_OR_DEPARTMENT_OTHER): Payer: Self-pay | Admitting: Emergency Medicine

## 2014-09-04 DIAGNOSIS — O368131 Decreased fetal movements, third trimester, fetus 1: Secondary | ICD-10-CM

## 2014-09-04 DIAGNOSIS — O1490 Unspecified pre-eclampsia, unspecified trimester: Secondary | ICD-10-CM | POA: Insufficient documentation

## 2014-09-04 DIAGNOSIS — M5430 Sciatica, unspecified side: Secondary | ICD-10-CM | POA: Diagnosis present

## 2014-09-04 DIAGNOSIS — Z3490 Encounter for supervision of normal pregnancy, unspecified, unspecified trimester: Secondary | ICD-10-CM

## 2014-09-04 DIAGNOSIS — O368130 Decreased fetal movements, third trimester, not applicable or unspecified: Secondary | ICD-10-CM | POA: Diagnosis present

## 2014-09-04 DIAGNOSIS — O368390 Maternal care for abnormalities of the fetal heart rate or rhythm, unspecified trimester, not applicable or unspecified: Secondary | ICD-10-CM | POA: Insufficient documentation

## 2014-09-04 DIAGNOSIS — F418 Other specified anxiety disorders: Secondary | ICD-10-CM | POA: Diagnosis present

## 2014-09-04 DIAGNOSIS — Z3A33 33 weeks gestation of pregnancy: Secondary | ICD-10-CM | POA: Insufficient documentation

## 2014-09-04 DIAGNOSIS — O368190 Decreased fetal movements, unspecified trimester, not applicable or unspecified: Secondary | ICD-10-CM | POA: Insufficient documentation

## 2014-09-04 DIAGNOSIS — O99344 Other mental disorders complicating childbirth: Secondary | ICD-10-CM | POA: Diagnosis present

## 2014-09-04 DIAGNOSIS — O321XX0 Maternal care for breech presentation, not applicable or unspecified: Secondary | ICD-10-CM | POA: Diagnosis present

## 2014-09-04 DIAGNOSIS — O163 Unspecified maternal hypertension, third trimester: Secondary | ICD-10-CM

## 2014-09-04 DIAGNOSIS — O99334 Smoking (tobacco) complicating childbirth: Secondary | ICD-10-CM | POA: Diagnosis present

## 2014-09-04 DIAGNOSIS — O1413 Severe pre-eclampsia, third trimester: Principal | ICD-10-CM | POA: Diagnosis present

## 2014-09-04 DIAGNOSIS — O169 Unspecified maternal hypertension, unspecified trimester: Secondary | ICD-10-CM | POA: Diagnosis present

## 2014-09-04 NOTE — ED Provider Notes (Signed)
CSN: [id number]     Arrival date & time 09/04/14  2257 History   First MD Initiated Contact with Patient 09/04/14 2311     Chief Complaint  Patient presents with  . Decreased Fetal Movement     (Consider location/radiation/quality/duration/timing/severity/associated sxs/prior Treatment) Patient is a 24 y.o. female presenting with abdominal pain. The history is provided by the patient. No language interpreter was used.  Abdominal Pain Pain location:  Generalized Pain radiates to:  Does not radiate Pain severity:  Mild Onset quality:  Gradual Duration:  3 days Timing:  Constant Progression:  Worsening Chronicity:  New Relieved by:  Nothing Worsened by:  Nothing tried Ineffective treatments:  None tried Associated symptoms: no dysuria and no shortness of breath   Pt reports she is pregnant,  due in September. Pt reports baby has been moving less for the last 3 days.   Pt also complains of a headache. Pt has also had some swelling in lower legs.    Past Medical History  Diagnosis Date  . Depression   . Asthma    History reviewed. No pertinent past surgical history. History reviewed. No pertinent family history. History  Substance Use Topics  . Smoking status: Current Every Day Smoker  . Smokeless tobacco: Not on file  . Alcohol Use: Yes   OB History    Gravida Para Term Preterm AB TAB SAB Ectopic Multiple Living   1              Review of Systems  Respiratory: Negative for shortness of breath.   Gastrointestinal: Positive for abdominal pain.  Genitourinary: Negative for dysuria.  All other systems reviewed and are negative.     Allergies  Review of patient's allergies indicates no known allergies.  Home Medications   Prior to Admission medications   Medication Sig Start Date End Date Taking? Authorizing Provider  acetaminophen (TYLENOL) 500 MG tablet Take 1 tablet (500 mg total) by mouth every 6 (six) hours as needed. 02/11/14   Antony Madura, PA-C  cephALEXin  (KEFLEX) 500 MG capsule Take 1 capsule (500 mg total) by mouth 3 (three) times daily. 02/11/14   Antony Madura, PA-C  famotidine (PEPCID) 20 MG tablet Take 1 tablet (20 mg total) by mouth 2 (two) times daily. 08/04/14   Everlene Farrier, PA-C  FLUoxetine (PROZAC) 20 MG capsule Take 40 mg by mouth daily.    Historical Provider, MD  ibuprofen (ADVIL,MOTRIN) 200 MG tablet Take 200 mg by mouth every 6 (six) hours as needed for moderate pain.    Historical Provider, MD   BP 164/110 mmHg  Pulse 56  Temp(Src) 98.3 F (36.8 C) (Oral)  Resp 18  Ht 5\' 3"  (1.6 m)  Wt 177 lb (80.287 kg)  BMI 31.36 kg/m2  SpO2 100%  LMP 01/21/2014 Physical Exam  Constitutional: She appears well-developed and well-nourished.  HENT:  Head: Normocephalic.  Eyes: Pupils are equal, round, and reactive to light.  Neck: Normal range of motion.  Cardiovascular: Normal rate.   Pulmonary/Chest: Effort normal and breath sounds normal.  Abdominal: Soft. Bowel sounds are normal.  Genitourinary:  Fetal heart rate 140's to 150's.    Musculoskeletal: She exhibits edema.  1 plus edema feet,   Neurological: She is alert.  Skin: Skin is warm.  Nursing note and vitals reviewed.   ED Course  Informal ultrasound fht's 150.  Labs ordered.   I discussed pt with Dr. Dareen Piano her OB.  He agreed to accept pt to [facility] for evaluation.  I spoke to Quintella Baton RN at [place].    Procedures (including critical care time) Labs Review Labs Reviewed  CBC WITH DIFFERENTIAL/PLATELET  COMPREHENSIVE METABOLIC PANEL  URINALYSIS, ROUTINE W REFLEX MICROSCOPIC (NOT AT [facility])    Imaging Review No results found.   EKG Interpretation None      MDM   Final diagnoses:  Pregnancy  Hypertension affecting pregnancy in third trimester  Decreased fetal movement, third trimester, fetus 1        Lonia Skinner [place], PA-C 09/05/14 0012  Shon Baton, MD 09/05/14 [phone]

## 2014-09-04 NOTE — ED Notes (Signed)
Patient placed on Fetal Heart Monitor. [facility] hospital called and aware of the patients status.

## 2014-09-04 NOTE — ED Notes (Signed)
Patient states that she has had chest tightness and HA with swelling x 2 -3 day. Reports that she has bilateral lower leg swelling. Patient also reports that she does not feel baby move as much as usual

## 2014-09-04 NOTE — ED Notes (Signed)
Patient has noted bilateral swelling to her ankles and feet.

## 2014-09-05 ENCOUNTER — Inpatient Hospital Stay (HOSPITAL_COMMUNITY): Payer: Medicaid Other

## 2014-09-05 ENCOUNTER — Encounter (HOSPITAL_BASED_OUTPATIENT_CLINIC_OR_DEPARTMENT_OTHER): Payer: Self-pay | Admitting: *Deleted

## 2014-09-05 DIAGNOSIS — Z3A33 33 weeks gestation of pregnancy: Secondary | ICD-10-CM | POA: Diagnosis present

## 2014-09-05 DIAGNOSIS — O368190 Decreased fetal movements, unspecified trimester, not applicable or unspecified: Secondary | ICD-10-CM | POA: Insufficient documentation

## 2014-09-05 DIAGNOSIS — M5430 Sciatica, unspecified side: Secondary | ICD-10-CM | POA: Diagnosis present

## 2014-09-05 DIAGNOSIS — F418 Other specified anxiety disorders: Secondary | ICD-10-CM | POA: Diagnosis present

## 2014-09-05 DIAGNOSIS — O1490 Unspecified pre-eclampsia, unspecified trimester: Secondary | ICD-10-CM | POA: Insufficient documentation

## 2014-09-05 DIAGNOSIS — O1413 Severe pre-eclampsia, third trimester: Secondary | ICD-10-CM | POA: Diagnosis present

## 2014-09-05 DIAGNOSIS — O169 Unspecified maternal hypertension, unspecified trimester: Secondary | ICD-10-CM | POA: Diagnosis present

## 2014-09-05 DIAGNOSIS — O321XX0 Maternal care for breech presentation, not applicable or unspecified: Secondary | ICD-10-CM | POA: Diagnosis present

## 2014-09-05 DIAGNOSIS — O99334 Smoking (tobacco) complicating childbirth: Secondary | ICD-10-CM | POA: Diagnosis present

## 2014-09-05 DIAGNOSIS — O368390 Maternal care for abnormalities of the fetal heart rate or rhythm, unspecified trimester, not applicable or unspecified: Secondary | ICD-10-CM | POA: Insufficient documentation

## 2014-09-05 DIAGNOSIS — O368130 Decreased fetal movements, third trimester, not applicable or unspecified: Secondary | ICD-10-CM | POA: Diagnosis present

## 2014-09-05 DIAGNOSIS — O99344 Other mental disorders complicating childbirth: Secondary | ICD-10-CM | POA: Diagnosis present

## 2014-09-05 LAB — PROTEIN / CREATININE RATIO, URINE
CREATININE, URINE: 66 mg/dL
Protein Creatinine Ratio: 1.8 mg/mg{Cre} — ABNORMAL HIGH (ref 0.00–0.15)
Total Protein, Urine: 119 mg/dL

## 2014-09-05 LAB — COMPREHENSIVE METABOLIC PANEL
ALT: 10 U/L — ABNORMAL LOW (ref 14–54)
ANION GAP: 6 (ref 5–15)
AST: 21 U/L (ref 15–41)
Albumin: 3.2 g/dL — ABNORMAL LOW (ref 3.5–5.0)
Albumin: 2.7 g/dL — ABNORMAL LOW (ref 3.5–5.0)
Alkaline Phosphatase: 147 U/L — ABNORMAL HIGH (ref 38–126)
Alkaline Phosphatase: 141 U/L — ABNORMAL HIGH (ref 38–126)
Anion gap: 11 (ref 5–15)
BILIRUBIN TOTAL: 0.5 mg/dL (ref 0.3–1.2)
BUN: 10 mg/dL (ref 6–20)
BUN: 9 mg/dL (ref 6–20)
CALCIUM: 8.1 mg/dL — AB (ref 8.9–10.3)
CALCIUM: 8.9 mg/dL (ref 8.9–10.3)
CHLORIDE: 105 mmol/L (ref 101–111)
CO2: 19 mmol/L — AB (ref 22–32)
CO2: 21 mmol/L — AB (ref 22–32)
Chloride: 109 mmol/L (ref 101–111)
Creatinine, Ser: 0.62 mg/dL (ref 0.44–1.00)
Creatinine, Ser: 0.66 mg/dL (ref 0.44–1.00)
GLUCOSE: 86 mg/dL (ref 65–99)
GLUCOSE: 83 mg/dL (ref 65–99)
Potassium: 4.2 mmol/L (ref 3.5–5.1)
Potassium: 4 mmol/L (ref 3.5–5.1)
SODIUM: 137 mmol/L (ref 135–145)
Sodium: 134 mmol/L — ABNORMAL LOW (ref 135–145)
TOTAL PROTEIN: 5.7 g/dL — AB (ref 6.5–8.1)
Total Bilirubin: 0.5 mg/dL (ref 0.3–1.2)
Total Protein: 6.6 g/dL (ref 6.5–8.1)

## 2014-09-05 LAB — CBC WITH DIFFERENTIAL/PLATELET
Basophils Absolute: 0 10*3/uL (ref 0.0–0.1)
Basophils Relative: 0 % (ref 0–1)
EOS PCT: 0 % (ref 0–5)
Eosinophils Absolute: 0 10*3/uL (ref 0.0–0.7)
HEMATOCRIT: 34.1 % — AB (ref 36.0–46.0)
HEMOGLOBIN: 11.2 g/dL — AB (ref 12.0–15.0)
LYMPHS ABS: 2.6 10*3/uL (ref 0.7–4.0)
Lymphocytes Relative: 28 % (ref 12–46)
MCH: 27.6 pg (ref 26.0–34.0)
MCHC: 32.8 g/dL (ref 30.0–36.0)
MCV: 84 fL (ref 78.0–100.0)
MONOS PCT: 3 % (ref 3–12)
Monocytes Absolute: 0.3 10*3/uL (ref 0.1–1.0)
Neutro Abs: 6.3 10*3/uL (ref 1.7–7.7)
Neutrophils Relative %: 69 % (ref 43–77)
Platelets: 259 10*3/uL (ref 150–400)
RBC: 4.06 MIL/uL (ref 3.87–5.11)
RDW: 14.2 % (ref 11.5–15.5)
WBC: 9.1 10*3/uL (ref 4.0–10.5)

## 2014-09-05 LAB — URINALYSIS, ROUTINE W REFLEX MICROSCOPIC
Bilirubin Urine: NEGATIVE
Glucose, UA: NEGATIVE mg/dL
Hgb urine dipstick: NEGATIVE
Ketones, ur: NEGATIVE mg/dL
LEUKOCYTES UA: NEGATIVE
NITRITE: NEGATIVE
Protein, ur: 100 mg/dL — AB
Specific Gravity, Urine: 1.015 (ref 1.005–1.030)
UROBILINOGEN UA: 0.2 mg/dL (ref 0.0–1.0)
pH: 7.5 (ref 5.0–8.0)

## 2014-09-05 LAB — URINE MICROSCOPIC-ADD ON

## 2014-09-05 LAB — ABO/RH: ABO/RH(D): A POS

## 2014-09-05 LAB — TYPE AND SCREEN
ABO/RH(D): A POS
ANTIBODY SCREEN: NEGATIVE

## 2014-09-05 IMAGING — US US OB LIMITED
1 series · 13 of 28 positions shown · non-contrast
Comparison: none

OBSTETRICS REPORT
(Signed Final 09/05/2014 [DATE])

Service(s) Provided
[HOSPITAL]                                         76815.0
Indications
32 weeks gestation of pregnancy
Non-reactive NST, FHR decelerations
Decreased fetal movement
Hypertension - Gestational
Fetal Evaluation
Num Of Fetuses:    1
Fetal Heart Rate:  142                          bpm
Cardiac Activity:  Observed
Presentation:      Breech
Placenta:          Posterior, above cervical
os
Amniotic Fluid
AFI FV:      Subjectively low-normal
AFI Sum:     9.16    cm       10  %Tile     Larg Pckt:     4.1  cm
RUQ:   2.42    cm   LUQ:    4.1    cm    LLQ:   2.64    cm
Biophysical Evaluation
Amniotic F.V:   Within normal limits       F. Tone:         Observed
F. Movement:    Observed                   Score:           [DATE]
F. Breathing:   Not Observed
Gestational Age
LMP:           32w 3d        Date:  01/21/14                 EDD:   10/28/14
Best:          32w 3d     Det. By:  LMP  (01/21/14)          EDD:   10/28/14
Cervix Uterus Adnexa
Cervix:       Not visualized (advanced GA >11wks)
Impression
INDICATION: 24 yr old G1P0 at 22w3d with likely preeclampsia
and fetal decleration for BPP. Remote read.

[Series 1: us ob follow up · 34 acquisitions, 13 frames shown]
[im 2/34]
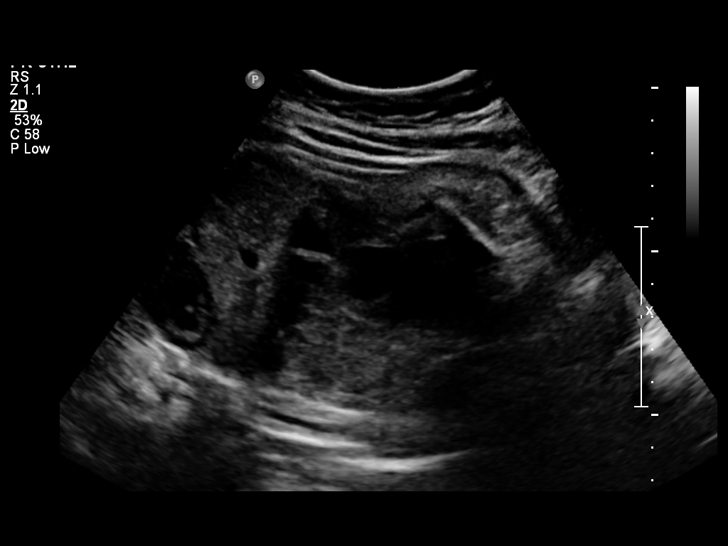
[im 4/34]
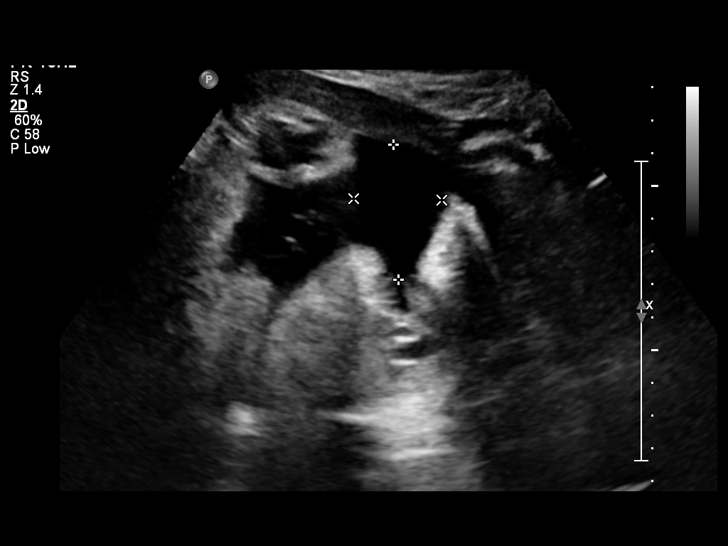
[im 7/34]
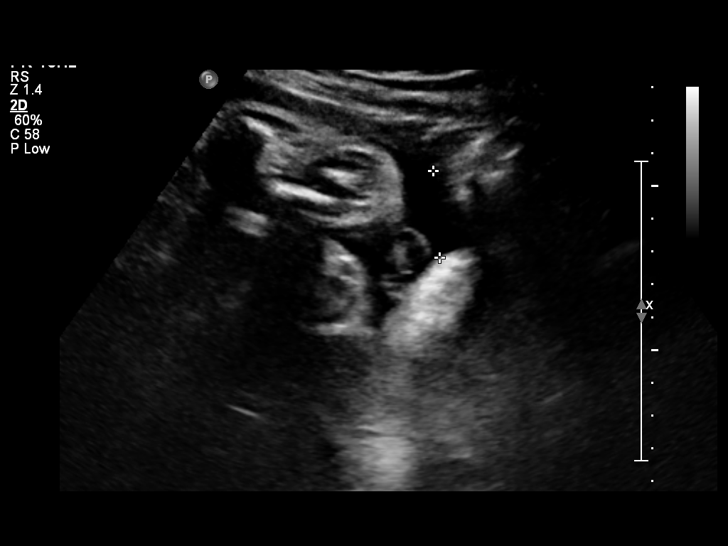
[im 9/34]
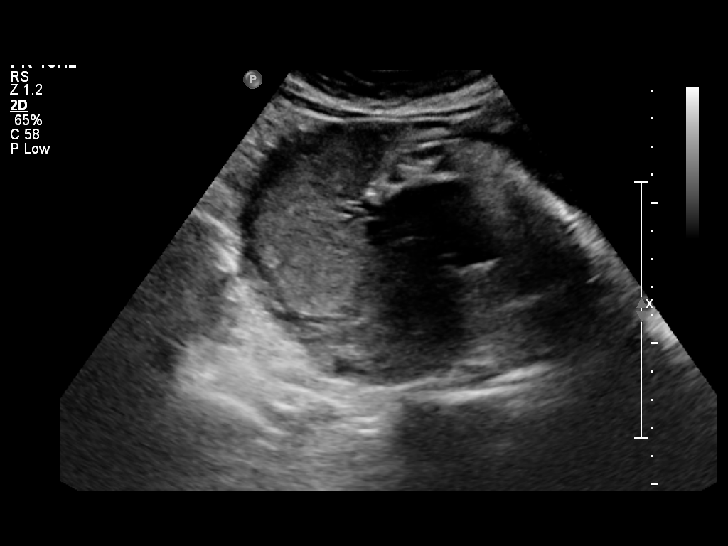
[im 12/34]
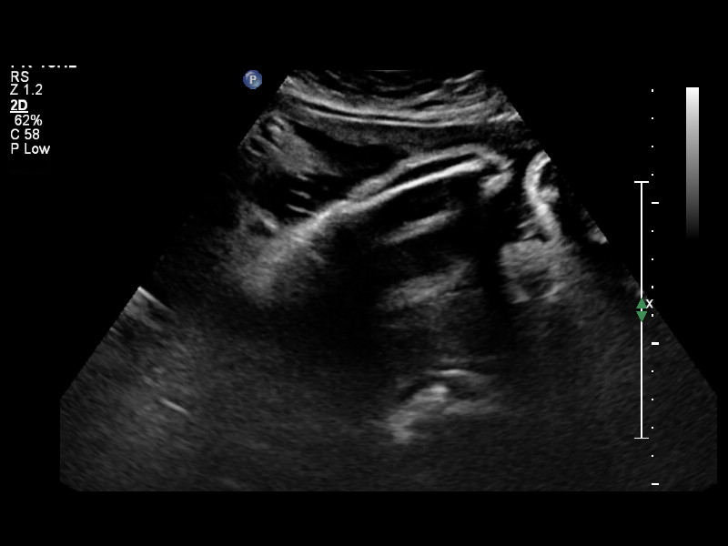
[im 14/34]
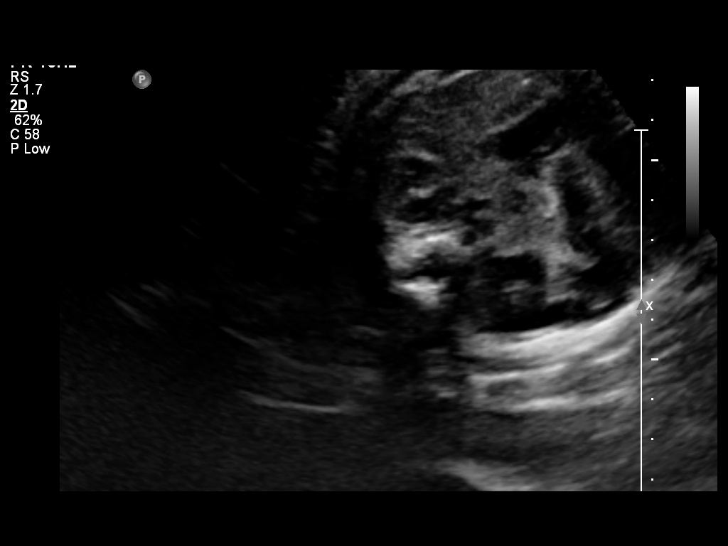
[im 18/34]
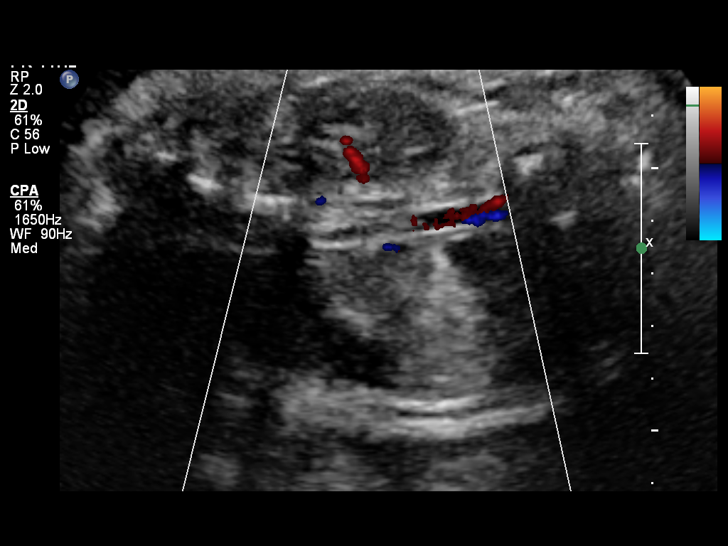
[im 20/34]
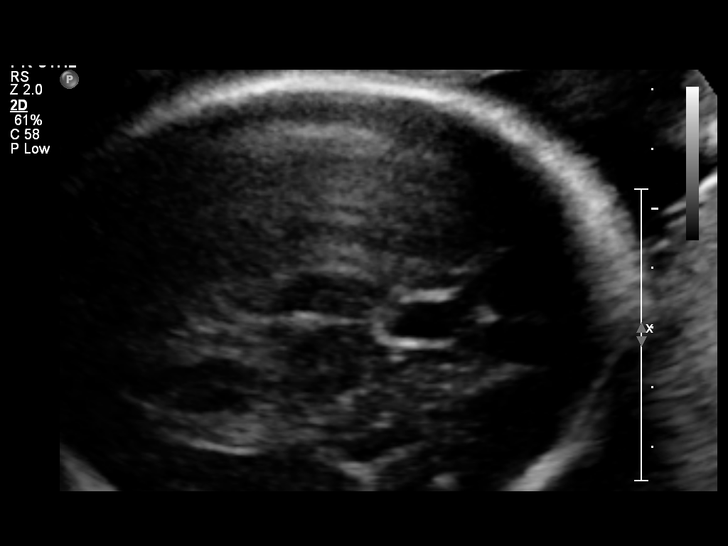
[im 23/34]
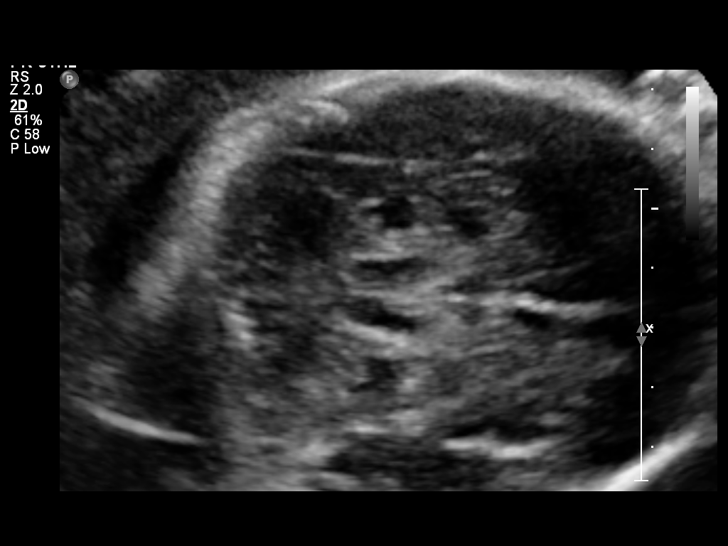
[im 25/34]
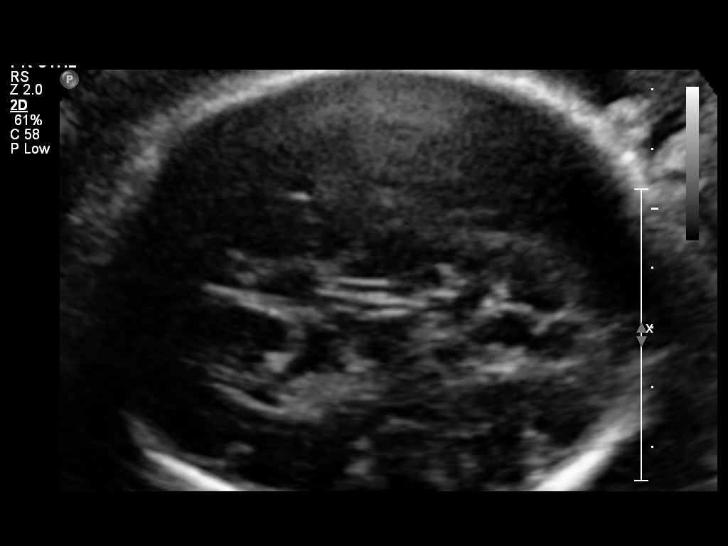
[im 27/34]
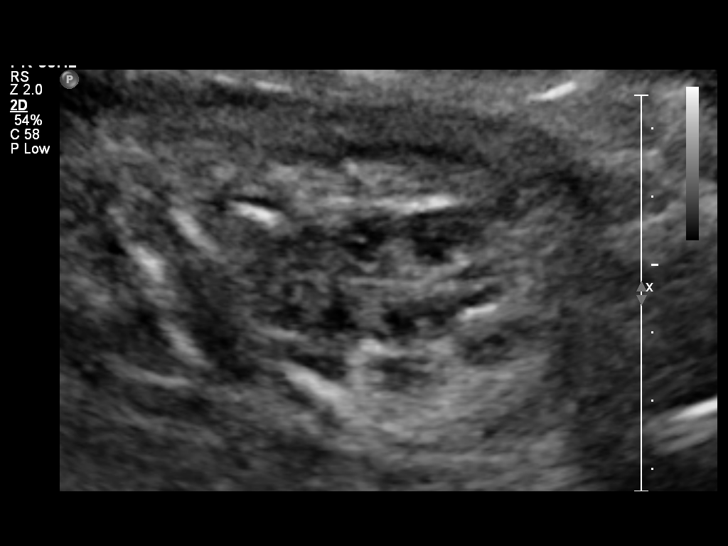
[im 30/34]
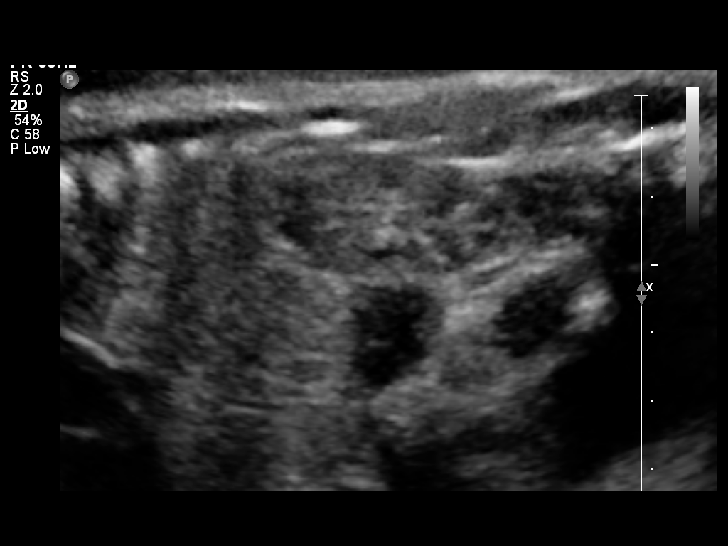
[im 32/34]
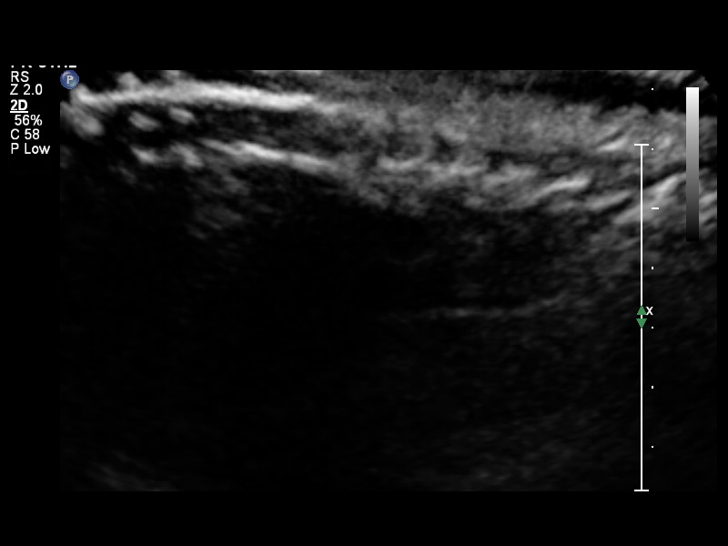

[13 of 28 positions shown; findings below may reference images not displayed]

FINDINGS: 1. Single intrauterine pregnancy.
2. Posterior placenta without evidence of previa.
3. Normal amniotic fluid index; although slightly decreased for
gestational age.
4. Biophysical profile is [DATE] (-2 for breathing).
5. Fetus is in breech presentation.
Recommendations

1. BPP [DATE]:
- per chart had fetal deceleration therefore likely [DATE] BPP
- recommend continuous monnitoring at this time (nurse
aware by sonographer to place patient back on the monitor
after this procedure)
- recommend delivery for nonreassuring fetal tracing
- recommend repeat BPP in 6-24 hours
2. Preeclampsia:
- management per primary OB
3. Would recommend a course of [place] if not
already given or if qualifies for rescue course

## 2014-09-05 MED ORDER — LACTATED RINGERS IV SOLN: INTRAVENOUS | Status: DC

## 2014-09-05 MED ORDER — PRENATAL MULTIVITAMIN CH
1.0000 | ORAL_TABLET | Freq: Every day | ORAL | Status: DC
  Administered 2014-09-05: 1 via ORAL
  Filled 2014-09-05: qty 1

## 2014-09-05 MED ORDER — ZOLPIDEM TARTRATE 5 MG PO TABS: 5.0000 mg | ORAL_TABLET | Freq: Every evening | ORAL | Status: DC | PRN

## 2014-09-05 MED ORDER — LACTATED RINGERS IV SOLN
INTRAVENOUS | Status: DC
  Administered 2014-09-05: 100 mL/h via INTRAVENOUS
  Administered 2014-09-05 – 2014-09-06 (×3): via INTRAVENOUS

## 2014-09-05 MED ORDER — FAMOTIDINE 20 MG PO TABS: 40.0000 mg | ORAL_TABLET | Freq: Two times a day (BID) | ORAL | Status: DC

## 2014-09-05 MED ORDER — OXYCODONE-ACETAMINOPHEN 5-325 MG PO TABS
1.0000 | ORAL_TABLET | Freq: Four times a day (QID) | ORAL | Status: DC | PRN
  Filled 2014-09-05: qty 2

## 2014-09-05 MED ORDER — CALCIUM CARBONATE ANTACID 500 MG PO CHEW: 2.0000 | CHEWABLE_TABLET | ORAL | Status: DC | PRN

## 2014-09-05 MED ORDER — ONDANSETRON HCL 4 MG/2ML IJ SOLN
4.0000 mg | Freq: Four times a day (QID) | INTRAMUSCULAR | Status: DC
  Filled 2014-09-05: qty 2

## 2014-09-05 MED ORDER — ACETAMINOPHEN 325 MG PO TABS
650.0000 mg | ORAL_TABLET | ORAL | Status: DC | PRN
  Administered 2014-09-05 – 2014-09-06 (×3): 650 mg via ORAL
  Filled 2014-09-05 (×4): qty 2

## 2014-09-05 MED ORDER — FAMOTIDINE 20 MG PO TABS: 20.0000 mg | ORAL_TABLET | Freq: Two times a day (BID) | ORAL | Status: DC

## 2014-09-05 MED ORDER — FAMOTIDINE 20 MG PO TABS
40.0000 mg | ORAL_TABLET | Freq: Two times a day (BID) | ORAL | Status: DC
  Administered 2014-09-05 – 2014-09-12 (×14): 40 mg via ORAL
  Filled 2014-09-05 (×16): qty 2

## 2014-09-05 MED ORDER — HYDRALAZINE HCL 20 MG/ML IJ SOLN: 10.0000 mg | Freq: Once | INTRAMUSCULAR | Status: AC | PRN

## 2014-09-05 MED ORDER — MAGNESIUM SULFATE BOLUS VIA INFUSION
4.0000 g | Freq: Once | INTRAVENOUS | Status: AC
Start: 2014-09-05 — End: 2014-09-05
  Administered 2014-09-05: 4 g via INTRAVENOUS
  Filled 2014-09-05: qty 500

## 2014-09-05 MED ORDER — COMPLETENATE 29-1 MG PO CHEW
1.0000 | CHEWABLE_TABLET | Freq: Every day | ORAL | Status: DC
  Filled 2014-09-05 (×2): qty 1

## 2014-09-05 MED ORDER — BETAMETHASONE SOD PHOS & ACET 6 (3-3) MG/ML IJ SUSP
12.0000 mg | Freq: Every day | INTRAMUSCULAR | Status: AC
  Administered 2014-09-05 – 2014-09-06 (×2): 12 mg via INTRAMUSCULAR
  Filled 2014-09-05 (×2): qty 2

## 2014-09-05 MED ORDER — DOCUSATE SODIUM 100 MG PO CAPS
100.0000 mg | ORAL_CAPSULE | Freq: Every day | ORAL | Status: DC
  Administered 2014-09-05 – 2014-09-06 (×2): 100 mg via ORAL
  Filled 2014-09-05 (×2): qty 1

## 2014-09-05 MED ORDER — MAGNESIUM SULFATE 50 % IJ SOLN
2.0000 g/h | INTRAVENOUS | Status: DC
  Administered 2014-09-05 (×2): 2 g/h via INTRAVENOUS
  Filled 2014-09-05 (×2): qty 80

## 2014-09-05 MED ORDER — ONDANSETRON HCL 4 MG/2ML IJ SOLN
4.0000 mg | Freq: Four times a day (QID) | INTRAMUSCULAR | Status: DC | PRN
  Administered 2014-09-05 – 2014-09-07 (×2): 4 mg via INTRAVENOUS

## 2014-09-05 MED ORDER — LABETALOL HCL 5 MG/ML IV SOLN
20.0000 mg | INTRAVENOUS | Status: DC | PRN
Start: 2014-09-05 — End: 2014-09-07
  Administered 2014-09-05: 20 mg via INTRAVENOUS
  Filled 2014-09-05: qty 4

## 2014-09-05 NOTE — ED Notes (Signed)
Pt to [facility] ED c/o decreased FM x 2 days, increased swelling, HA, chest tightness, reflux.  Pt noted to have increased BP upon evaluation.  ED RN reports that Dr. Dareen Piano has already been called & pt will be transferred to [facility].  ED RN notified that pt has has 2 ucs in past 12 min.  Pt awaiting carelink to arrive for transport.

## 2014-09-05 NOTE — H&P (Signed)
Pt is a 24 y/o white female who initially presented to the [facility] ER c/o decreased fetal movement/headache/edema. She was found to have significant HTN and was transferred to [facility]. On arrival to the ER she had B/P readings in the severe range. She was started on the Preeclampsia protocol and given IV Labetalol. A decel was also noted on the NST. The baby then had a BBP which was normal and no further decels. She had no sigificant abnormalities in her labs. Given this scenerio she was admitted for steroids and IV MgSO4 for prophylaxis.  Since admission she has had on severe B/P readings without meds. Her headaches have resolved. She has no RUQ pain and no vision problems.   PE: Per practitioner on admission. This am DTRs3/4 no clonus.  IMP/ IUP at 33 weeks with preeclampsia, some severe B/P readings  PLAN/ Pt was admitted for steroids and MgSO4 Prophylaxis            Will repeat labs in am and complete the 24 hour Urine            Will ask MFM to do an u/s and consult in am.

## 2014-09-05 NOTE — MAU Note (Signed)
Pt transferred from [facility] for decreased fetal movement and elevated b/p.

## 2014-09-05 NOTE — Progress Notes (Signed)
Patient reports good fetal movement. 

## 2014-09-05 NOTE — MAU Provider Note (Signed)
Chief Complaint:  Decreased Fetal Movement   First Provider Initiated Contact with Patient 09/05/14 0139      HPI: Christy Bell is a 24 y.o. G1P0 at 11w4dwho presents to maternity admissions reporting decreased fetal movement x 3 days, increased swelling of feet/legs and some swelling of her hands and face, and h/a off and on x 3 days.  She reports feeling some fetal movement, but much less in last 3 days than usual even after having caffeine.  She reports only drinking a small glass of orange juice and no other fluids all day today.  She has not taken anything for her headache. She also reports LLQ and left flank pain radiating down her left leg. She reports diagnosis of sciatica and reports the pain is similar to this. She does have epigastric pain but reports this is similar to the heartburn she has frequently in the pregnancy and has not worsened.  She denies visual disturbances.   She denies abdominal cramping, LOF, vaginal bleeding, vaginal itching/burning, urinary symptoms, dizziness, n/v, or fever/chills.    HPI  Past Medical History: Past Medical History  Diagnosis Date  . Depression   . Asthma     Past obstetric history: OB History  Gravida Para Term Preterm AB SAB TAB Ectopic Multiple Living  1             # Outcome Date GA Lbr Len/2nd Weight Sex Delivery Anes PTL Lv  1 Current               Past Surgical History: History reviewed. No pertinent past surgical history.  Family History: History reviewed. No pertinent family history.  Social History: History  Substance Use Topics  . Smoking status: Current Every Day Smoker  . Smokeless tobacco: Not on file  . Alcohol Use: Yes    Allergies: No Known Allergies  Meds:  Prescriptions prior to admission  Medication Sig Dispense Refill Last Dose  . acetaminophen (TYLENOL) 500 MG tablet Take 1 tablet (500 mg total) by mouth every 6 (six) hours as needed. 30 tablet 0   . cephALEXin (KEFLEX) 500 MG capsule Take 1  capsule (500 mg total) by mouth 3 (three) times daily. 30 capsule 0   . famotidine (PEPCID) 20 MG tablet Take 1 tablet (20 mg total) by mouth 2 (two) times daily. 60 tablet 1   . FLUoxetine (PROZAC) 20 MG capsule Take 40 mg by mouth daily.   02/10/2014 at Unknown time  . ibuprofen (ADVIL,MOTRIN) 200 MG tablet Take 200 mg by mouth every 6 (six) hours as needed for moderate pain.   02/10/2014 at Unknown time    Review of Systems  Constitutional: Negative for fever, chills and malaise/fatigue.  Eyes: Negative for blurred vision.  Respiratory: Negative for cough and shortness of breath.   Cardiovascular: Positive for leg swelling. Negative for chest pain.  Gastrointestinal: Positive for heartburn and abdominal pain. Negative for nausea and vomiting.  Genitourinary: Negative for dysuria, urgency and frequency.  Musculoskeletal: Negative.   Neurological: Positive for dizziness. Negative for headaches.  Psychiatric/Behavioral: Negative for depression.    Physical Exam  Blood pressure 134/81, pulse 78, temperature 98.3 F (36.8 C), temperature source Oral, resp. rate 18, height 5\' 3"  (1.6 m), weight 80.287 kg (177 lb), last menstrual period 01/21/2014, SpO2 100 %. GENERAL: Well-developed, well-nourished female in no acute distress.  EYES: normal sclera/conjunctiva; no lid-lag HENT: Atraumatic, normocephalic HEART: normal rate RESP: normal effort ABDOMEN: Soft, non-tender MUSCULOSKELETAL: Normal ROM EXTREMITIES: Nontender, no edema  NEURO/PSYCH: Alert and oriented, appropriate affect  PELVIC EXAM: Cervix pink, visually closed, without lesion, scant white creamy discharge, vaginal walls and external genitalia normal Bimanual exam: Cervix 0/long/high, firm, anterior, neg CMT, uterus nontender, nonenlarged, adnexa without tenderness, enlargement, or mass     FHT:  Baseline 145 , moderate variability, accelerations present, prolonged deceleration x 3 minutes x 1 Contractions: None on toco or to  palpation   Labs: Results for orders placed or performed during the hospital encounter of 09/04/14 (from the past 24 hour(s))  CBC with Differential/Platelet     Status: Abnormal   Collection Time: 09/04/14 11:50 PM  Result Value Ref Range   WBC 9.1 4.0 - 10.5 K/uL   RBC 4.06 3.87 - 5.11 MIL/uL   Hemoglobin 11.2 (L) 12.0 - 15.0 g/dL   HCT 16.1 (L) 09.6 - 04.5 %   MCV 84.0 78.0 - 100.0 fL   MCH 27.6 26.0 - 34.0 pg   MCHC 32.8 30.0 - 36.0 g/dL   RDW 40.9 81.1 - 91.4 %   Platelets 259 150 - 400 K/uL   Neutrophils Relative % 69 43 - 77 %   Neutro Abs 6.3 1.7 - 7.7 K/uL   Lymphocytes Relative 28 12 - 46 %   Lymphs Abs 2.6 0.7 - 4.0 K/uL   Monocytes Relative 3 3 - 12 %   Monocytes Absolute 0.3 0.1 - 1.0 K/uL   Eosinophils Relative 0 0 - 5 %   Eosinophils Absolute 0.0 0.0 - 0.7 K/uL   Basophils Relative 0 0 - 1 %   Basophils Absolute 0.0 0.0 - 0.1 K/uL  Comprehensive metabolic panel     Status: Abnormal   Collection Time: 09/04/14 11:50 PM  Result Value Ref Range   Sodium 137 135 - 145 mmol/L   Potassium 4.2 3.5 - 5.1 mmol/L   Chloride 105 101 - 111 mmol/L   CO2 21 (L) 22 - 32 mmol/L   Glucose, Bld 83 65 - 99 mg/dL   BUN 10 6 - 20 mg/dL   Creatinine, Ser 7.82 0.44 - 1.00 mg/dL   Calcium 8.9 8.9 - 95.6 mg/dL   Total Protein 6.6 6.5 - 8.1 g/dL   Albumin 3.2 (L) 3.5 - 5.0 g/dL   AST RESULTS UNAVAILABLE DUE TO INTERFERING SUBSTANCE 15 - 41 U/L   ALT RESULTS UNAVAILABLE DUE TO INTERFERING SUBSTANCE 14 - 54 U/L   Alkaline Phosphatase 147 (H) 38 - 126 U/L   Total Bilirubin 0.5 0.3 - 1.2 mg/dL   GFR calc non Af Amer >60 >60 mL/min   GFR calc Af Amer >60 >60 mL/min   Anion gap 11 5 - 15  Urinalysis, Routine w reflex microscopic (not at [facility])     Status: Abnormal   Collection Time: 09/05/14  1:25 AM  Result Value Ref Range   Color, Urine YELLOW YELLOW   APPearance CLEAR CLEAR   Specific Gravity, Urine 1.015 1.005 - 1.030   pH 7.5 5.0 - 8.0   Glucose, UA NEGATIVE NEGATIVE mg/dL    Hgb urine dipstick NEGATIVE NEGATIVE   Bilirubin Urine NEGATIVE NEGATIVE   Ketones, ur NEGATIVE NEGATIVE mg/dL   Protein, ur 213 (A) NEGATIVE mg/dL   Urobilinogen, UA 0.2 0.0 - 1.0 mg/dL   Nitrite NEGATIVE NEGATIVE   Leukocytes, UA NEGATIVE NEGATIVE  Protein / creatinine ratio, urine     Status: Abnormal   Collection Time: 09/05/14  1:25 AM  Result Value Ref Range   Creatinine, Urine 66.00 mg/dL  Total Protein, Urine 119 mg/dL   Protein Creatinine Ratio 1.80 (H) 0.00 - 0.15 mg/mg[Cre]  Urine microscopic-add on     Status: None   Collection Time: 09/05/14  1:25 AM  Result Value Ref Range   Squamous Epithelial / LPF RARE RARE   WBC, UA 0-2 <3 WBC/hpf   Bacteria, UA RARE RARE   CANCELED/CANCELED/-- (01/13 1349)   Imaging:   Preliminary report with 6/8 BPP  ED Course BP severe range upon arrival to MAU.  Preeclampsia protocol initiated and 1 dose of labetalol IV given.  Pt BP normal to borderline with no severe range pressures after medication given.  FHR deceleration noted so BPP ordered.  Called Dr Dareen Piano to review labs, assessment, imaging.  CMP from [facility] pending ALT results.  Practitioner from [facility] called to report that lab unable to be resulted, recommend drawing new CMP at our facility.  CMP drawn.   Assessment: 1. Pregnancy   2. Hypertension affecting pregnancy in third trimester   3. Decreased fetal movement, third trimester, fetus 1   4. Fetal heart rate decelerations affecting management of mother   5. Decreased fetal movement     Plan: Consult Dr Dareen Piano, reviewed labs, U/S, assessment Admit for observation to antepartum Collect 24 hour urine  Magnesium sulfate 4g bolus, 2g/hour BMZ x 2 doses in 24 hours Continuous EFM  Sharen Counter Certified Nurse-Midwife 09/05/2014 3:18 AM

## 2014-09-06 ENCOUNTER — Inpatient Hospital Stay (HOSPITAL_COMMUNITY): Payer: Medicaid Other

## 2014-09-06 LAB — CBC
HCT: 31.6 % — ABNORMAL LOW (ref 36.0–46.0)
Hemoglobin: 10.3 g/dL — ABNORMAL LOW (ref 12.0–15.0)
MCH: 27.3 pg (ref 26.0–34.0)
MCHC: 32.6 g/dL (ref 30.0–36.0)
MCV: 83.8 fL (ref 78.0–100.0)
Platelets: 228 K/uL (ref 150–400)
RBC: 3.77 MIL/uL — ABNORMAL LOW (ref 3.87–5.11)
RDW: 14.6 % (ref 11.5–15.5)
WBC: 9.8 K/uL (ref 4.0–10.5)

## 2014-09-06 LAB — PROTEIN, URINE, 24 HOUR
COLLECTION INTERVAL-UPROT: 24 h
PROTEIN, URINE: 23 mg/dL
Protein, 24H Urine: 960 mg/d — ABNORMAL HIGH (ref 50–100)
Urine Total Volume-UPROT: 4175 mL

## 2014-09-06 LAB — CREATININE CLEARANCE, URINE, 24 HOUR
COLLECTION INTERVAL-CRCL: 24 h
CREATININE 24H UR: 998 mg/d (ref 600–1800)
CREATININE, URINE: 23.9 mg/dL
Creatinine Clearance: 110 mL/min (ref 75–115)
Urine Total Volume-CRCL: 4175 mL

## 2014-09-06 LAB — COMPREHENSIVE METABOLIC PANEL
ALT: 11 U/L — ABNORMAL LOW (ref 14–54)
ANION GAP: 4 — AB (ref 5–15)
AST: 19 U/L (ref 15–41)
Albumin: 2.7 g/dL — ABNORMAL LOW (ref 3.5–5.0)
Alkaline Phosphatase: 140 U/L — ABNORMAL HIGH (ref 38–126)
BUN: 8 mg/dL (ref 6–20)
CO2: 23 mmol/L (ref 22–32)
Calcium: 7.1 mg/dL — ABNORMAL LOW (ref 8.9–10.3)
Chloride: 108 mmol/L (ref 101–111)
Creatinine, Ser: 0.63 mg/dL (ref 0.44–1.00)
GFR calc Af Amer: >60 mL/min (ref >60)
GFR calc non Af Amer: >60 mL/min (ref >60)
Glucose, Bld: 110 mg/dL — ABNORMAL HIGH (ref 65–99)
Potassium: 4.7 mmol/L (ref 3.5–5.1)
SODIUM: 135 mmol/L (ref 135–145)
TOTAL PROTEIN: 5.8 g/dL — AB (ref 6.5–8.1)
Total Bilirubin: 0.7 mg/dL (ref 0.3–1.2)

## 2014-09-06 LAB — URIC ACID: Uric Acid, Serum: 6.4 mg/dL (ref 2.3–6.6)

## 2014-09-06 IMAGING — US US OB COMP +14 WK
1 series · 12 of 28 positions shown · non-contrast
Comparison: none

[Series 1: us ob follow up · 67 acquisitions, 12 frames shown]
[im 3/67]
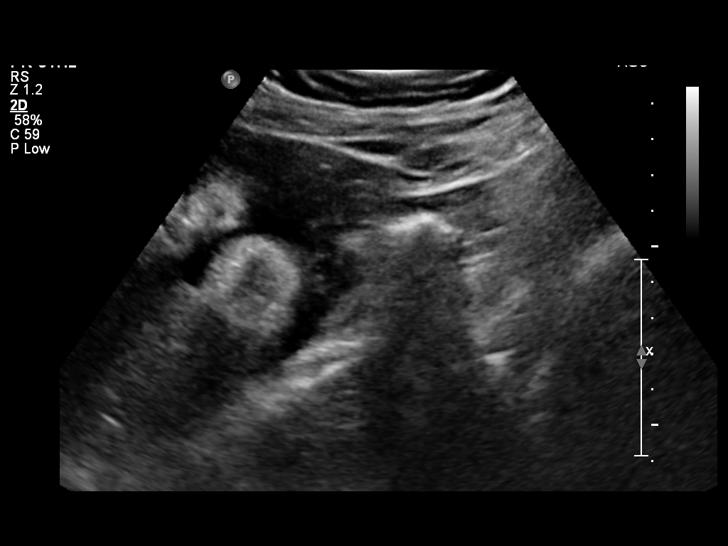
[im 8/67]
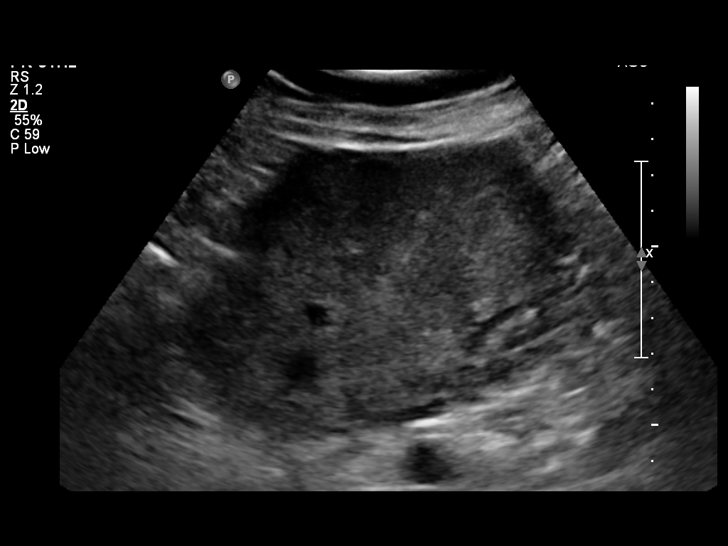
[im 13/67]
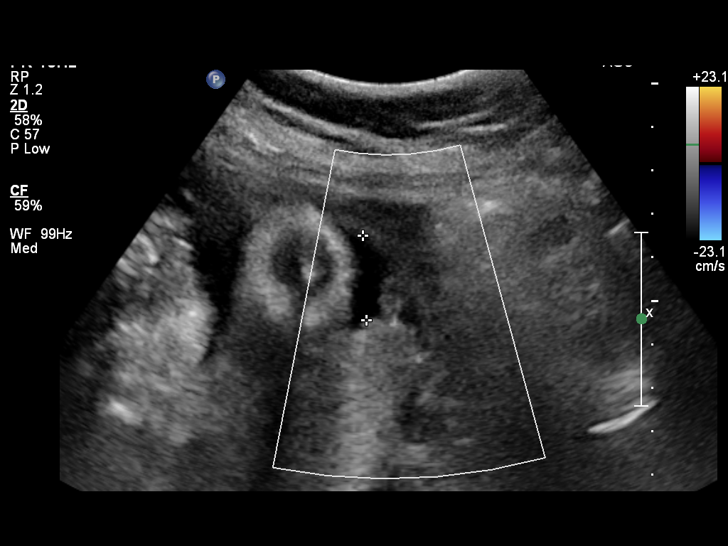
[im 20/67]
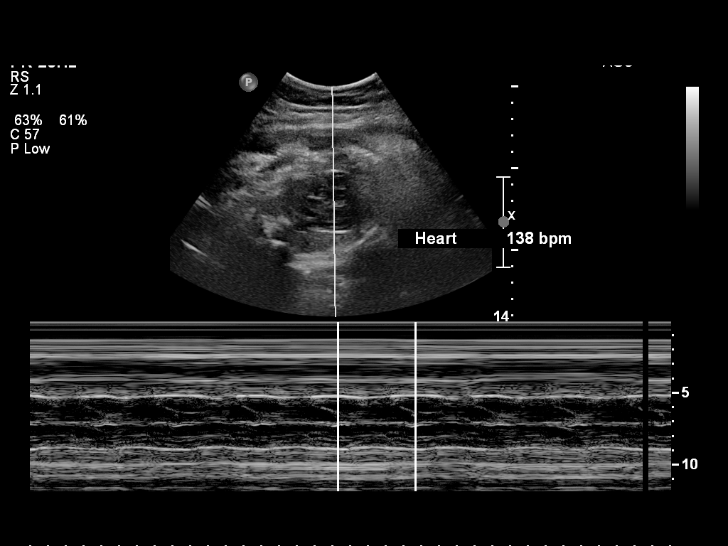
[im 25/67]
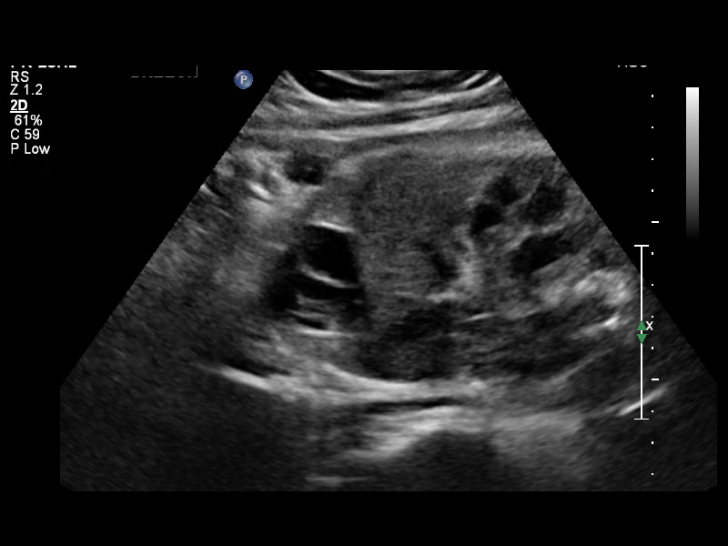
[im 30/67]
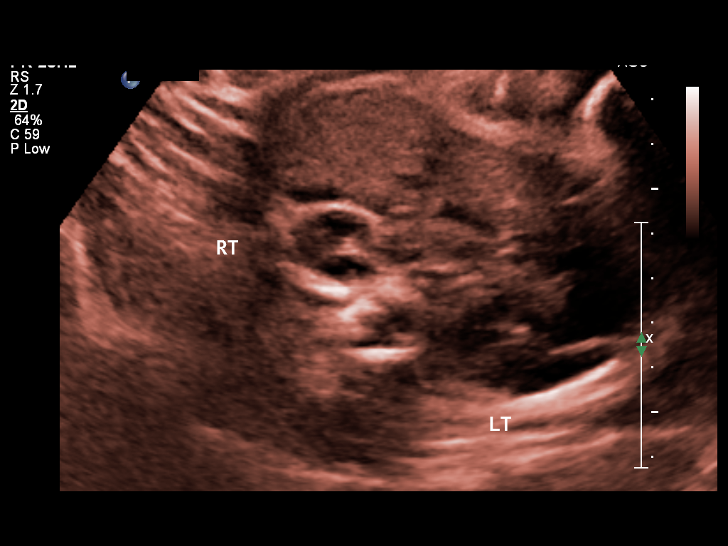
[im 37/67]
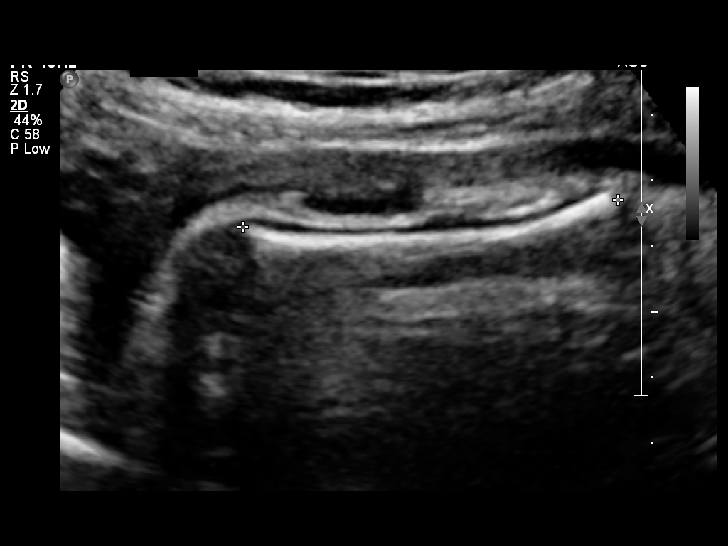
[im 42/67]
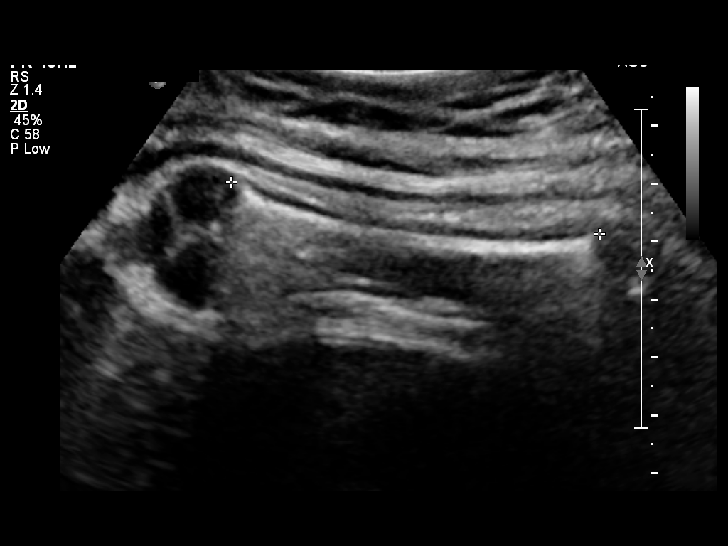
[im 47/67]
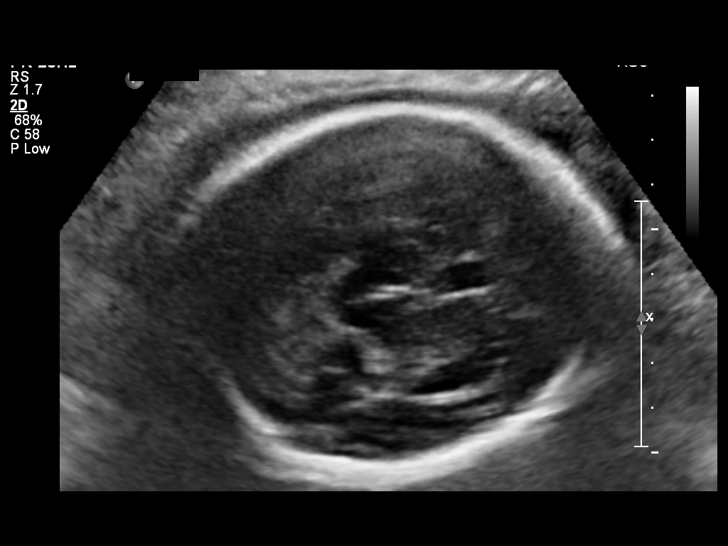
[im 54/67]
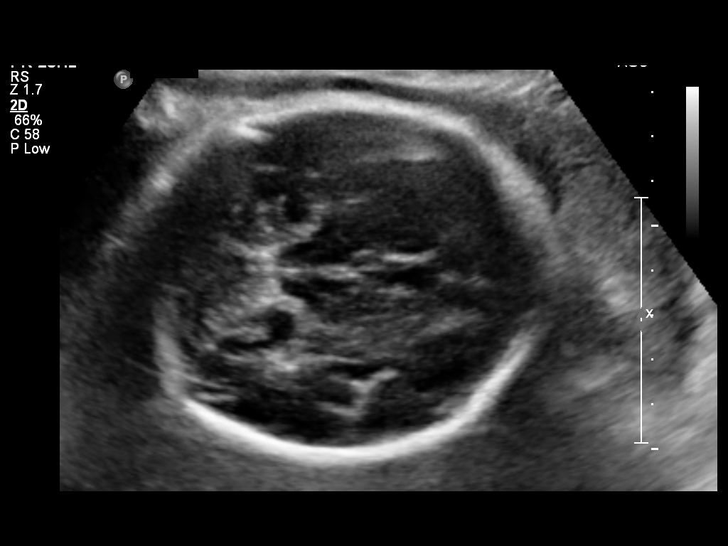
[im 59/67]
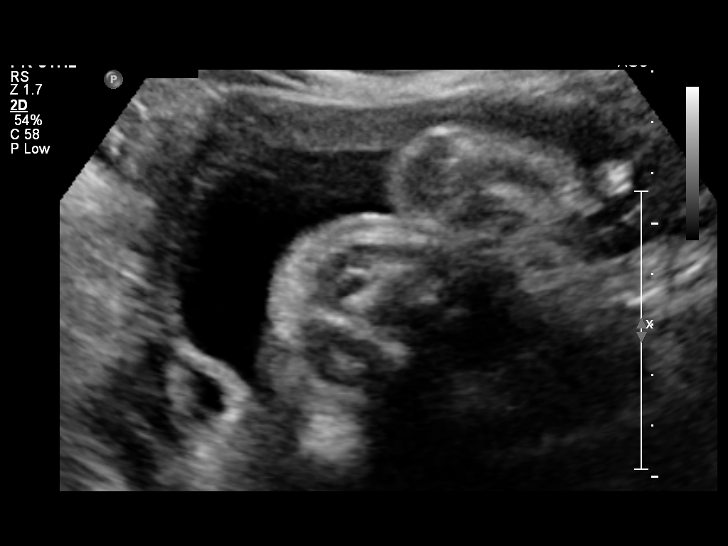
[im 64/67]
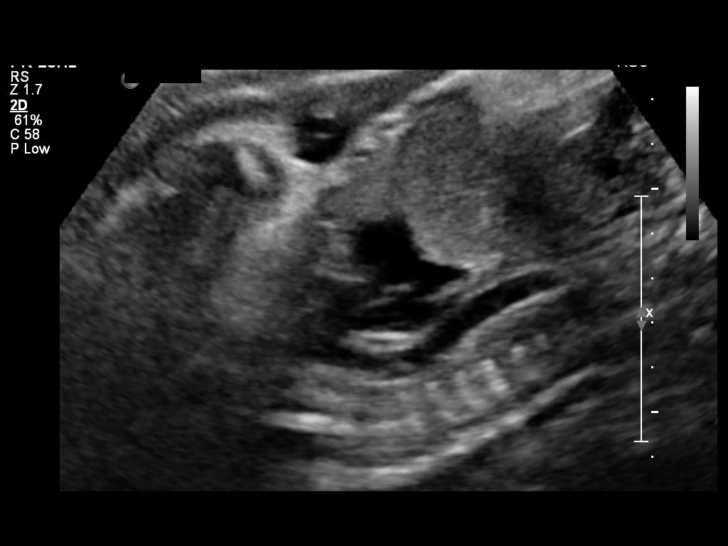

[12 of 28 positions shown; findings below may reference images not displayed]

OBSTETRICS REPORT
(Signed Final 09/06/2014 [DATE])

Service(s) Provided

US OB COMP + 14 WK                                    76805.1
Indications

Basic anatomic survey                                 z36
33 weeks gestation of pregnancy
Unspecified preeclampsia, third trimester
Hypertension - Gestational
Fetal Evaluation

Num Of Fetuses:    1
Fetal Heart Rate:  129                          bpm
Cardiac Activity:  Observed
Presentation:      Breech
Placenta:          Posterior, above cervical
os

Amniotic Fluid
AFI FV:      Subjectively within normal limits
AFI Sum:     12.51   cm       36  %Tile      Larg Pckt:    5.5  cm
RUQ:   5.5     cm   RLQ:    2.45   cm    LUQ:   1.94    cm    LLQ:   2.62    cm
Biometry

BPD:     78.2  mm     G. Age:  31w 3d                CI:        69.83   70 - 86
FL/HC:       21.2  19.9 -
21.5
HC:     298.6  mm     G. Age:  33w 0d       25   %   HC/AC:       1.02  0.96 -
1.11
AC:     293.6  mm     G. Age:  33w 3d       72   %   FL/BPD:      80.9  71 - 87
FL:      63.3  mm     G. Age:  32w 5d       43   %   FL/AC:       21.6  20 - 24
HUM:       57  mm     G. Age:  33w 1d       66   %

Est. FW:    7611   gm    4 lb 10 oz     65  %
Gestational Age

LMP:           32w 4d        Date:  01/21/14                 EDD:   10/28/14
U/S Today:     32w 4d                                        EDD:   10/28/14
Best:          32w 4d     Det. By:  LMP  (01/21/14)          EDD:   10/28/14
Anatomy
Cranium:          Appears normal         Aortic Arch:      Appears normal
Fetal Cavum:      Appears normal         Ductal Arch:      Previously seen
Ventricles:       Appears normal         Diaphragm:        Appears normal
Choroid Plexus:   Not well visualized    Stomach:          Appears normal, left
sided
Cerebellum:       Not well visualized    Abdomen:          Appears normal
Posterior Fossa:  Not well visualized    Abdominal Wall:   Not well visualized
Nuchal Fold:      Not applicable (>20    Cord Vessels:     Appears normal (3
wks GA)                                  vessel cord)
Face:             Orbits appear          Kidneys:          Appear normal
normal
Lips:             Appears normal         Bladder:          Appears normal
Heart:            Appears normal         Spine:            Not well visualized
(4CH, axis, and
situs)
RVOT:             Appears normal         Lower             Appears normal
Extremities:
LVOT:             Not well visualized    Upper             Appears normal
Extremities:

Other:  Fetus appears to be a female. Technically difficult due to advanced
GA and fetal position.
Targeted Anatomy

Fetal Central Nervous System
Lat. Ventricles:
Cervix Uterus Adnexa

Cervix:       Not visualized (advanced GA >28wks)
Uterus:       No abnormality visualized.
Left Ovary:    Not visualized.
Right Ovary:   Not visualized.
Adnexa:     No abnormality visualized. No adnexal mass visualized.
Impression

Single IUP at 32w 4d
Preeclampsia
Somewhat limited views of the fetal anatomy obtained due to
late gestational age and fetal position
No anomalies noted
The estimated fetal weight is at the 65th %tile
Posterior placenta without previa
Normal amniotic fluid volume
Recommendations

Recommend ultasound for interval growth in 3-4 weeks
Follow up ultrasounds as clinically indicated

## 2014-09-06 MED ORDER — SODIUM CHLORIDE 0.9 % IJ SOLN
3.0000 mL | Freq: Two times a day (BID) | INTRAMUSCULAR | Status: DC
  Administered 2014-09-06 – 2014-09-11 (×8): 3 mL via INTRAVENOUS

## 2014-09-06 MED ORDER — MAGNESIUM SULFATE 50 % IJ SOLN: 2.0000 g/h | INTRAVENOUS | Status: DC

## 2014-09-06 MED ORDER — LACTATED RINGERS IV SOLN
2.0000 g/h | INTRAVENOUS | Status: DC
  Filled 2014-09-06: qty 80

## 2014-09-06 NOTE — Progress Notes (Signed)
Pt concerned about pinched nerve at site ofinjection BMZ, pain Reports pain in thigh. Showed RN areamon R side of lower back

## 2014-09-06 NOTE — Progress Notes (Signed)
Pt to US.

## 2014-09-06 NOTE — Progress Notes (Signed)
Dr Chestine Spore reviewed pt strip, discussed plan of care with pt

## 2014-09-06 NOTE — Progress Notes (Signed)
Notified Dr Chestine Spore of pt complaints of lower right back and leg pain. No new orders at this time

## 2014-09-06 NOTE — Progress Notes (Signed)
Growth [place]:  2065 g (65%) AFI: 12 Position: BREECH

## 2014-09-06 NOTE — Progress Notes (Signed)
24 y.o. G1P0 [redacted]w[redacted]d HD#1 admitted for Pregnancy [Z33.1] Decreased fetal movement, third trimester, fetus 1 [O36.8131] Hypertension affecting pregnancy in third trimester [O16.3].   24 yo G1 who presented on 7/30 w complaints of decreased fetal movement and not feeling well. She was noted to have severe range BPs on arrival and received 1 dose of  IV labetalol.  She was placed on magnesium.  She had a 24 hr urine collection that returned this AM with  protein. Mag was stopped at 0400 overnight.  She has a long h/o almost daily headaches since prior to pregnancy.  She reports her headaches Fri and Sat prior to arrival were slightly different, extending into her upper back. They were easily relieved w tylenol.  She had one episode of BV on Saturday, none since.  Denies RUQ pain, no HA at this time.    FM was decreased on admission, has been normal since.  BPs over last 24 hours 110-140/70-90s.  She was not started on an oral anti-hypertensive.      BP 133/93 mmHg  Pulse 70  Temp(Src) 97.2 F (36.2 C) (Axillary)  Resp 18  Ht  (1.6 m)  Wt 82.192 kg (181 lb 3.2 oz)  BMI 32.11 kg/m2  SpO2 100%  LMP 01/21/2014   Abd  Soft, gravid, nontender Ex SCDs, DTRs 3+, no clonus FHTs 125 mod var, + accels, no decels Toco  quiet  Results for orders placed or performed during the hospital encounter of 09/04/14 (from the past 24 hour(s))  CBC     Status: Abnormal   Collection Time: 09/06/14  4:40 AM  Result Value Ref Range   WBC 9.8 4.0 - 10.5 K/uL   RBC 3.77 (L) 3.87 - 5.11 MIL/uL   Hemoglobin 10.3 (L) 12.0 - 15.0 g/dL   HCT 16.1 (L) 09.6 - 04.5 %   MCV 83.8 78.0 - 100.0 fL   MCH 27.3 26.0 - 34.0 pg   MCHC 32.6 30.0 - 36.0 g/dL   RDW 40.9 81.1 - 91.4 %   Platelets 228 150 - 400 K/uL  Comprehensive metabolic panel     Status: Abnormal   Collection Time: 09/06/14  4:40 AM  Result Value Ref Range   Sodium 135 135 - 145 mmol/L   Potassium 4.7 3.5 - 5.1 mmol/L   Chloride 108 101 -  111 mmol/L   CO2 23 22 - 32 mmol/L   Glucose, Bld 110 (H) 65 - 99 mg/dL   BUN 8 6 - 20 mg/dL   Creatinine, Ser 7.82 0.44 - 1.00 mg/dL   Calcium 7.1 (L) 8.9 - 10.3 mg/dL   Total Protein 5.8 (L) 6.5 - 8.1 g/dL   Albumin 2.7 (L) 3.5 - 5.0 g/dL   AST 19 15 - 41 U/L   ALT 11 (L) 14 - 54 U/L   Alkaline Phosphatase 140 (H) 38 - 126 U/L   Total Bilirubin 0.7 0.3 - 1.2 mg/dL   GFR calc non Af Amer >60 >60 mL/min   GFR calc Af Amer >60 >60 mL/min   Anion gap 4 (L) 5 - 15  Uric acid     Status: None   Collection Time: 09/06/14  4:40 AM  Result Value Ref Range   Uric Acid, Serum 6.4 2.3 - 6.6 mg/dL   24 hr urine: 956 mg   A:  HD#2  [redacted]w[redacted]d with preeclampsia without severe features.  P:  BPs have normalized without additional therapy in last 24 hours, remain mild range.  Goal <160/110.   Mild HA this AM, resolved w tylenol.  Currently asymptomatic [place] today to assess fetal growth, AFI.   Will monitor BPs off magnesium x next 24 hours.  Delivery at 37 weeks, or sooner if development of severe features, persistently sever BPs >160/110 unresponsive to medications, platelets <100k , LFTs > 2x upper limit of normal, Cr > 1.1, or non-reassuring fetal status.  May consider d/c later this week with close outpatient management if remains stable.  Long discussion with patient and partner this AM re: plan of care, all questions answered.    [facility] [name] [employer]

## 2014-09-06 NOTE — Progress Notes (Signed)
This note also relates to the following rows which could not be included: BP - Cannot attach notes to unvalidated device data Pulse Rate - Cannot attach notes to unvalidated device data   Just returned from U/S

## 2014-09-07 ENCOUNTER — Encounter (HOSPITAL_COMMUNITY): Payer: Self-pay | Admitting: *Deleted

## 2014-09-07 ENCOUNTER — Inpatient Hospital Stay (HOSPITAL_COMMUNITY): Payer: Medicaid Other | Admitting: Anesthesiology

## 2014-09-07 ENCOUNTER — Encounter (HOSPITAL_COMMUNITY)
Admission: AD | Disposition: A | Discharge status: Home / Self Care | Payer: Self-pay | Source: Ambulatory Visit | Attending: Obstetrics & Gynecology

## 2014-09-07 ENCOUNTER — Encounter (HOSPITAL_COMMUNITY): Payer: Self-pay | Admitting: Anesthesiology

## 2014-09-07 LAB — COMPREHENSIVE METABOLIC PANEL
ALBUMIN: 3.1 g/dL — AB (ref 3.5–5.0)
ALT: 15 U/L (ref 14–54)
AST: 25 U/L (ref 15–41)
Alkaline Phosphatase: 145 U/L — ABNORMAL HIGH (ref 38–126)
Anion gap: 4 — ABNORMAL LOW (ref 5–15)
BUN: 9 mg/dL (ref 6–20)
CALCIUM: 7.7 mg/dL — AB (ref 8.9–10.3)
CO2: 22 mmol/L (ref 22–32)
CREATININE: 0.62 mg/dL (ref 0.44–1.00)
Chloride: 111 mmol/L (ref 101–111)
GFR calc Af Amer: >60 mL/min (ref >60)
Glucose, Bld: 97 mg/dL (ref 65–99)
Potassium: 4.1 mmol/L (ref 3.5–5.1)
SODIUM: 137 mmol/L (ref 135–145)
TOTAL PROTEIN: 6.4 g/dL — AB (ref 6.5–8.1)
Total Bilirubin: 0.3 mg/dL (ref 0.3–1.2)

## 2014-09-07 LAB — URIC ACID: URIC ACID, SERUM: 6.2 mg/dL (ref 2.3–6.6)

## 2014-09-07 LAB — CBC
HEMATOCRIT: 32.5 % — AB (ref 36.0–46.0)
HEMOGLOBIN: 10.4 g/dL — AB (ref 12.0–15.0)
MCH: 27.4 pg (ref 26.0–34.0)
MCHC: 32 g/dL (ref 30.0–36.0)
MCV: 85.5 fL (ref 78.0–100.0)
Platelets: 236 10*3/uL (ref 150–400)
RBC: 3.8 MIL/uL — AB (ref 3.87–5.11)
RDW: 15.1 % (ref 11.5–15.5)
WBC: 9.4 10*3/uL (ref 4.0–10.5)

## 2014-09-07 LAB — MRSA PCR SCREENING: MRSA BY PCR: NEGATIVE

## 2014-09-07 LAB — LACTATE DEHYDROGENASE: LDH: 233 U/L — AB (ref 98–192)

## 2014-09-07 SURGERY — Surgical Case
Anesthesia: Spinal

## 2014-09-07 MED ORDER — LANOLIN HYDROUS EX OINT: 1.0000 "application " | TOPICAL_OINTMENT | CUTANEOUS | Status: DC | PRN

## 2014-09-07 MED ORDER — OXYTOCIN 40 UNITS IN LACTATED RINGERS INFUSION - SIMPLE MED
INTRAVENOUS | Status: DC | PRN
  Administered 2014-09-07: 40 [IU] via INTRAVENOUS

## 2014-09-07 MED ORDER — BUPIVACAINE HCL (PF) 0.25 % IJ SOLN
INTRAMUSCULAR | Status: DC | PRN
  Administered 2014-09-07: 30 mL

## 2014-09-07 MED ORDER — SIMETHICONE 80 MG PO CHEW
80.0000 mg | CHEWABLE_TABLET | ORAL | Status: DC
  Administered 2014-09-07 – 2014-09-10 (×4): 80 mg via ORAL
  Filled 2014-09-07 (×4): qty 1

## 2014-09-07 MED ORDER — HYDROMORPHONE HCL 1 MG/ML IJ SOLN
0.2500 mg | INTRAMUSCULAR | Status: DC | PRN
  Administered 2014-09-07: 0.5 mg via INTRAVENOUS
  Administered 2014-09-07: 0.25 mg via INTRAVENOUS

## 2014-09-07 MED ORDER — CITRIC ACID-SODIUM CITRATE 334-500 MG/5ML PO SOLN
ORAL | Status: AC
  Filled 2014-09-07: qty 15

## 2014-09-07 MED ORDER — SENNOSIDES-DOCUSATE SODIUM 8.6-50 MG PO TABS
2.0000 | ORAL_TABLET | ORAL | Status: DC
  Administered 2014-09-07 – 2014-09-11 (×5): 2 via ORAL
  Filled 2014-09-07 (×5): qty 2

## 2014-09-07 MED ORDER — NALBUPHINE HCL 10 MG/ML IJ SOLN
5.0000 mg | INTRAMUSCULAR | Status: DC | PRN
  Administered 2014-09-07: 5 mg via SUBCUTANEOUS

## 2014-09-07 MED ORDER — DIPHENHYDRAMINE HCL 25 MG PO CAPS
25.0000 mg | ORAL_CAPSULE | ORAL | Status: DC | PRN
  Administered 2014-09-07 – 2014-09-08 (×4): 25 mg via ORAL
  Filled 2014-09-07 (×4): qty 1

## 2014-09-07 MED ORDER — DIBUCAINE 1 % RE OINT: 1.0000 "application " | TOPICAL_OINTMENT | RECTAL | Status: DC | PRN

## 2014-09-07 MED ORDER — NALBUPHINE HCL 10 MG/ML IJ SOLN
5.0000 mg | INTRAMUSCULAR | Status: DC | PRN
  Administered 2014-09-07: 5 mg via INTRAVENOUS
  Filled 2014-09-07: qty 1

## 2014-09-07 MED ORDER — TETANUS-DIPHTH-ACELL PERTUSSIS 5-2.5-18.5 LF-MCG/0.5 IM SUSP
0.5000 mL | Freq: Once | INTRAMUSCULAR | Status: DC
  Filled 2014-09-07: qty 0.5

## 2014-09-07 MED ORDER — MAGNESIUM SULFATE 50 % IJ SOLN
2.0000 g/h | INTRAVENOUS | Status: DC
  Administered 2014-09-07: 2 g/h via INTRAVENOUS
  Administered 2014-09-07: 4 g/h via INTRAVENOUS
  Administered 2014-09-08: 2 g/h via INTRAVENOUS
  Filled 2014-09-07 (×2): qty 80

## 2014-09-07 MED ORDER — DIPHENHYDRAMINE HCL 25 MG PO CAPS: 25.0000 mg | ORAL_CAPSULE | Freq: Four times a day (QID) | ORAL | Status: DC | PRN

## 2014-09-07 MED ORDER — SODIUM CHLORIDE 0.9 % IJ SOLN: 3.0000 mL | INTRAMUSCULAR | Status: DC | PRN

## 2014-09-07 MED ORDER — OXYCODONE-ACETAMINOPHEN 5-325 MG PO TABS
1.0000 | ORAL_TABLET | ORAL | Status: DC | PRN
  Administered 2014-09-08 – 2014-09-10 (×4): 1 via ORAL
  Filled 2014-09-07 (×5): qty 1

## 2014-09-07 MED ORDER — MAGNESIUM SULFATE BOLUS VIA INFUSION
4.0000 g | Freq: Once | INTRAVENOUS | Status: DC
  Filled 2014-09-07: qty 500

## 2014-09-07 MED ORDER — NALBUPHINE HCL 10 MG/ML IJ SOLN: 5.0000 mg | Freq: Once | INTRAMUSCULAR | Status: AC | PRN

## 2014-09-07 MED ORDER — MEPERIDINE HCL 25 MG/ML IJ SOLN: 6.2500 mg | INTRAMUSCULAR | Status: DC | PRN

## 2014-09-07 MED ORDER — LACTATED RINGERS IV SOLN
INTRAVENOUS | Status: DC
  Administered 2014-09-07 (×2): via INTRAVENOUS

## 2014-09-07 MED ORDER — BUPIVACAINE IN DEXTROSE 0.75-8.25 % IT SOLN
INTRATHECAL | Status: DC | PRN
  Administered 2014-09-07: 1.3 mL via INTRATHECAL

## 2014-09-07 MED ORDER — METOCLOPRAMIDE HCL 5 MG/ML IJ SOLN
INTRAMUSCULAR | Status: DC | PRN
  Administered 2014-09-07: 4 mg via INTRAVENOUS

## 2014-09-07 MED ORDER — BUPIVACAINE LIPOSOME 1.3 % IJ SUSP
20.0000 mL | Freq: Once | INTRAMUSCULAR | Status: DC
Start: 2014-09-07 — End: 2014-09-11
  Filled 2014-09-07 (×2): qty 20

## 2014-09-07 MED ORDER — BUPIVACAINE HCL (PF) 0.25 % IJ SOLN
INTRAMUSCULAR | Status: AC
Start: 2014-09-07 — End: 2014-09-07
  Filled 2014-09-07: qty 30

## 2014-09-07 MED ORDER — OXYTOCIN 40 UNITS IN LACTATED RINGERS INFUSION - SIMPLE MED: 62.5000 mL/h | INTRAVENOUS | Status: AC

## 2014-09-07 MED ORDER — LACTATED RINGERS IV SOLN
INTRAVENOUS | Status: DC
  Administered 2014-09-08: 08:00:00 via INTRAVENOUS

## 2014-09-07 MED ORDER — IBUPROFEN 600 MG PO TABS
600.0000 mg | ORAL_TABLET | Freq: Four times a day (QID) | ORAL | Status: DC
  Administered 2014-09-07 – 2014-09-08 (×3): 600 mg via ORAL
  Filled 2014-09-07 (×3): qty 1

## 2014-09-07 MED ORDER — PRENATAL MULTIVITAMIN CH
1.0000 | ORAL_TABLET | Freq: Every day | ORAL | Status: DC
  Filled 2014-09-07: qty 1

## 2014-09-07 MED ORDER — SIMETHICONE 80 MG PO CHEW
80.0000 mg | CHEWABLE_TABLET | ORAL | Status: DC | PRN
  Administered 2014-09-09 – 2014-09-10 (×2): 80 mg via ORAL
  Filled 2014-09-07 (×4): qty 1

## 2014-09-07 MED ORDER — HYDROMORPHONE HCL 1 MG/ML IJ SOLN
INTRAMUSCULAR | Status: AC
  Filled 2014-09-07: qty 1

## 2014-09-07 MED ORDER — PHENYLEPHRINE 8 MG IN D5W 100 ML (0.08MG/ML) PREMIX OPTIME
INJECTION | INTRAVENOUS | Status: DC | PRN
  Administered 2014-09-07: 20 ug/min via INTRAVENOUS

## 2014-09-07 MED ORDER — SODIUM CHLORIDE 0.9 % IR SOLN
Status: DC | PRN
  Administered 2014-09-07: 1000 mL

## 2014-09-07 MED ORDER — ONDANSETRON HCL 4 MG/2ML IJ SOLN: 4.0000 mg | Freq: Three times a day (TID) | INTRAMUSCULAR | Status: DC | PRN

## 2014-09-07 MED ORDER — NALBUPHINE HCL 10 MG/ML IJ SOLN
INTRAMUSCULAR | Status: AC
  Filled 2014-09-07: qty 1

## 2014-09-07 MED ORDER — ACETAMINOPHEN 500 MG PO TABS
1000.0000 mg | ORAL_TABLET | Freq: Four times a day (QID) | ORAL | Status: AC
  Administered 2014-09-07 – 2014-09-08 (×4): 1000 mg via ORAL
  Filled 2014-09-07 (×4): qty 2

## 2014-09-07 MED ORDER — MENTHOL 3 MG MT LOZG: 1.0000 | LOZENGE | OROMUCOSAL | Status: DC | PRN

## 2014-09-07 MED ORDER — MORPHINE SULFATE (PF) 0.5 MG/ML IJ SOLN
INTRAMUSCULAR | Status: DC | PRN
  Administered 2014-09-07: .1 mg via INTRATHECAL

## 2014-09-07 MED ORDER — BUPIVACAINE LIPOSOME 1.3 % IJ SUSP
INTRAMUSCULAR | Status: DC | PRN
  Administered 2014-09-07: 20 mL

## 2014-09-07 MED ORDER — WITCH HAZEL-GLYCERIN EX PADS: 1.0000 "application " | MEDICATED_PAD | CUTANEOUS | Status: DC | PRN

## 2014-09-07 MED ORDER — NALOXONE HCL 1 MG/ML IJ SOLN: 1.0000 ug/kg/h | INTRAVENOUS | Status: DC | PRN

## 2014-09-07 MED ORDER — LACTATED RINGERS IV SOLN
INTRAVENOUS | Status: DC | PRN
  Administered 2014-09-07: 10:00:00 via INTRAVENOUS

## 2014-09-07 MED ORDER — BUPIVACAINE HCL (PF) 0.25 % IJ SOLN
INTRAMUSCULAR | Status: AC
  Filled 2014-09-07: qty 30

## 2014-09-07 MED ORDER — ALUM & MAG HYDROXIDE-SIMETH 200-200-20 MG/5ML PO SUSP
30.0000 mL | Freq: Once | ORAL | Status: AC
  Administered 2014-09-07: 30 mL via ORAL
  Filled 2014-09-07: qty 30

## 2014-09-07 MED ORDER — SCOPOLAMINE 1 MG/3DAYS TD PT72
MEDICATED_PATCH | TRANSDERMAL | Status: DC | PRN
  Administered 2014-09-07: 1 via TRANSDERMAL

## 2014-09-07 MED ORDER — OXYCODONE-ACETAMINOPHEN 5-325 MG PO TABS: 2.0000 | ORAL_TABLET | ORAL | Status: DC | PRN

## 2014-09-07 MED ORDER — NALOXONE HCL 0.4 MG/ML IJ SOLN: 0.4000 mg | INTRAMUSCULAR | Status: DC | PRN

## 2014-09-07 MED ORDER — CEFAZOLIN SODIUM-DEXTROSE 2-3 GM-% IV SOLR
2.0000 g | INTRAVENOUS | Status: AC
  Administered 2014-09-07: 2 g via INTRAVENOUS
  Filled 2014-09-07: qty 50

## 2014-09-07 MED ORDER — SIMETHICONE 80 MG PO CHEW
80.0000 mg | CHEWABLE_TABLET | Freq: Three times a day (TID) | ORAL | Status: DC
  Administered 2014-09-07 – 2014-09-11 (×10): 80 mg via ORAL
  Filled 2014-09-07 (×10): qty 1

## 2014-09-07 MED ORDER — ACETAMINOPHEN 325 MG PO TABS
650.0000 mg | ORAL_TABLET | ORAL | Status: DC | PRN
  Administered 2014-09-08: 650 mg via ORAL
  Filled 2014-09-07: qty 2

## 2014-09-07 MED ORDER — DIPHENHYDRAMINE HCL 50 MG/ML IJ SOLN: 12.5000 mg | INTRAMUSCULAR | Status: DC | PRN

## 2014-09-07 MED ORDER — NALBUPHINE HCL 10 MG/ML IJ SOLN
5.0000 mg | Freq: Once | INTRAMUSCULAR | Status: AC | PRN
  Administered 2014-09-07: 5 mg via SUBCUTANEOUS

## 2014-09-07 MED ORDER — DIPHENHYDRAMINE HCL 50 MG/ML IJ SOLN
INTRAMUSCULAR | Status: DC | PRN
  Administered 2014-09-07: 50 mg via INTRAVENOUS

## 2014-09-07 MED ORDER — SCOPOLAMINE 1 MG/3DAYS TD PT72
1.0000 | MEDICATED_PATCH | Freq: Once | TRANSDERMAL | Status: DC
  Filled 2014-09-07: qty 1

## 2014-09-07 MED ORDER — FENTANYL CITRATE (PF) 100 MCG/2ML IJ SOLN
INTRAMUSCULAR | Status: DC | PRN
  Administered 2014-09-07: 25 ug via INTRATHECAL
  Administered 2014-09-07: 75 ug via INTRAVENOUS

## 2014-09-07 MED ORDER — LABETALOL HCL 5 MG/ML IV SOLN
20.0000 mg | INTRAVENOUS | Status: DC | PRN
  Filled 2014-09-07: qty 4

## 2014-09-07 MED ORDER — ZOLPIDEM TARTRATE 5 MG PO TABS
5.0000 mg | ORAL_TABLET | Freq: Every evening | ORAL | Status: DC | PRN
  Administered 2014-09-11: 5 mg via ORAL
  Filled 2014-09-07: qty 1

## 2014-09-07 SURGICAL SUPPLY — 38 items
BENZOIN TINCTURE PRP APPL 2/3 (GAUZE/BANDAGES/DRESSINGS) ×3 IMPLANT
CLAMP CORD UMBIL (MISCELLANEOUS) IMPLANT
CLOSURE WOUND 1/2 X4 (GAUZE/BANDAGES/DRESSINGS) ×1
CLOTH BEACON ORANGE TIMEOUT ST (SAFETY) ×3 IMPLANT
DERMABOND ADHESIVE PROPEN (GAUZE/BANDAGES/DRESSINGS) ×2
DERMABOND ADVANCED .7 DNX6 (GAUZE/BANDAGES/DRESSINGS) ×1 IMPLANT
DRAPE SHEET LG 3/4 BI-LAMINATE (DRAPES) IMPLANT
DRSG OPSITE POSTOP 4X10 (GAUZE/BANDAGES/DRESSINGS) ×3 IMPLANT
DURAPREP 26ML APPLICATOR (WOUND CARE) ×3 IMPLANT
ELECT REM PT RETURN 9FT ADLT (ELECTROSURGICAL) ×3
ELECTRODE REM PT RTRN 9FT ADLT (ELECTROSURGICAL) ×1 IMPLANT
EXTRACTOR VACUUM BELL STYLE (SUCTIONS) IMPLANT
EXTRACTOR VACUUM KIWI (MISCELLANEOUS) IMPLANT
GLOVE BIO SURGEON STRL SZ 6.5 (GLOVE) ×2 IMPLANT
GLOVE BIO SURGEONS STRL SZ 6.5 (GLOVE) ×1
GLOVE BIOGEL PI IND STRL 7.0 (GLOVE) ×1 IMPLANT
GLOVE BIOGEL PI INDICATOR 7.0 (GLOVE) ×2
GOWN STRL REUS W/TWL LRG LVL3 (GOWN DISPOSABLE) ×9 IMPLANT
KIT ABG SYR 3ML LUER SLIP (SYRINGE) IMPLANT
NEEDLE HYPO 22GX1.5 SAFETY (NEEDLE) IMPLANT
NEEDLE HYPO 25X5/8 SAFETYGLIDE (NEEDLE) IMPLANT
NS IRRIG 1000ML POUR BTL (IV SOLUTION) ×3 IMPLANT
PACK C SECTION WH (CUSTOM PROCEDURE TRAY) ×3 IMPLANT
PAD OB MATERNITY 4.3X12.25 (PERSONAL CARE ITEMS) ×3 IMPLANT
RTRCTR C-SECT PINK 25CM LRG (MISCELLANEOUS) ×3 IMPLANT
STRIP CLOSURE SKIN 1/2X4 (GAUZE/BANDAGES/DRESSINGS) ×2 IMPLANT
SUT CHROMIC 2 0 CT 1 (SUTURE) ×6 IMPLANT
SUT MNCRL 0 VIOLET CTX 36 (SUTURE) ×2 IMPLANT
SUT MONOCRYL 0 CTX 36 (SUTURE) ×4
SUT PDS AB 0 CTX 36 PDP370T (SUTURE) IMPLANT
SUT PLAIN 2 0 (SUTURE) ×2
SUT PLAIN ABS 2-0 CT1 27XMFL (SUTURE) ×1 IMPLANT
SUT VIC AB 0 CTX 36 (SUTURE) ×4
SUT VIC AB 0 CTX36XBRD ANBCTRL (SUTURE) ×2 IMPLANT
SUT VIC AB 4-0 KS 27 (SUTURE) ×3 IMPLANT
SYR CONTROL 10ML LL (SYRINGE) IMPLANT
TOWEL OR 17X24 6PK STRL BLUE (TOWEL DISPOSABLE) ×3 IMPLANT
TRAY FOLEY CATH SILVER 14FR (SET/KITS/TRAYS/PACK) ×3 IMPLANT

## 2014-09-07 NOTE — Anesthesia Preprocedure Evaluation (Signed)
Anesthesia Evaluation  Patient identified by MRN, date of birth, ID band Patient awake    Reviewed: Allergy & Precautions, H&P , Patient's Chart, lab work & pertinent test results  Airway Mallampati: II TM Distance: >3 FB Neck ROM: full    Dental no notable dental hx.    Pulmonary Current Smoker,  breath sounds clear to auscultation  Pulmonary exam normal       Cardiovascular Exercise Tolerance: Good Rhythm:regular Rate:Normal     Neuro/Psych    GI/Hepatic   Endo/Other    Renal/GU      Musculoskeletal   Abdominal   Peds  Hematology   Anesthesia Other Findings   Reproductive/Obstetrics                           Anesthesia Physical Anesthesia Plan  ASA: II  Anesthesia Plan: Spinal   Post-op Pain Management:    Induction:   Airway Management Planned:   Additional Equipment:   Intra-op Plan:   Post-operative Plan:   Informed Consent: I have reviewed the patients History and Physical, chart, labs and discussed the procedure including the risks, benefits and alternatives for the proposed anesthesia with the patient or authorized representative who has indicated his/her understanding and acceptance.   Dental Advisory Given  Plan Discussed with: CRNA  Anesthesia Plan Comments: (Lab work confirmed with CRNA in room. Platelets okay. Discussed spinal anesthetic, and patient consents to the procedure:  included risk of possible headache,backache, failed block, allergic reaction, and nerve injury. This patient was asked if she had any questions or concerns before the procedure started. )        Anesthesia Quick Evaluation  

## 2014-09-07 NOTE — Transfer of Care (Signed)
Immediate Anesthesia Transfer of Care Note  Patient: Christy Bell  Procedure(s) Performed: Procedure(s): CESAREAN SECTION (N/A)  Patient Location: PACU  Anesthesia Type:Spinal  Level of Consciousness: awake, alert  and oriented  Airway & Oxygen Therapy: Patient Spontanous Breathing  Post-op Assessment: Report given to RN and Post -op Vital signs reviewed and stable  Post vital signs: Reviewed and stable  Last Vitals:  Filed Vitals:   09/07/14 0931  BP: 171/93  Pulse: 63  Temp:   Resp:     Complications: No apparent anesthesia complications

## 2014-09-07 NOTE — Anesthesia Procedure Notes (Signed)
Spinal Patient location during procedure: OR Preanesthetic Checklist Completed: patient identified, site marked, surgical consent, pre-op evaluation, timeout performed, IV checked, risks and benefits discussed and monitors and equipment checked Spinal Block Patient position: sitting Prep: DuraPrep Patient monitoring: heart rate, cardiac monitor, continuous pulse ox and blood pressure Approach: midline Location: L3-4 Injection technique: single-shot Needle Needle type: Sprotte  Needle gauge: 24 G Needle length: 9 cm Assessment Sensory level: T4 Additional Notes Spinal Dosage in OR  Bupivicaine ml       1.3 PFMS04   mcg        100 Fentanyl mcg            25  Performed by Dr Desmond Lope

## 2014-09-07 NOTE — Brief Op Note (Signed)
09/04/2014 - 09/07/2014  10:52 AM  PATIENT:  Christy Bell  24 y.o. female  PRE-OPERATIVE DIAGNOSIS:  primary cesarean section non-reassuring fetal heart tracing  POST-OPERATIVE DIAGNOSIS:  primary cesarean section non-reassuring fetal heart tracing  PROCEDURE:  Procedure(s): CESAREAN SECTION (N/A)  SURGEON:  Surgeon(s) and Role:    * Essie Hart, MD - Primary  ASSISTANTS: none   ANESTHESIA:   spinal  EBL:  Total I/O In: 500 [I.V.:500] Out: 550 [Urine:300; Blood:250]  BLOOD ADMINISTERED:none  DRAINS: Urinary Catheter (Foley)   LOCAL MEDICATIONS USED:  MARCAINE    and OTHER with Exparel  SPECIMEN:  Source of Specimen:  Placenta  DISPOSITION OF SPECIMEN:  PATHOLOGY  COUNTS:  YES  TOURNIQUET:  * No tourniquets in log *  DICTATION: .Note written in EPIC  PLAN OF CARE: Admit to inpatient   PATIENT DISPOSITION:  PACU - hemodynamically stable.   Delay start of Pharmacological VTE agent (>24hrs) due to surgical blood loss or risk of bleeding: not applicable  Artavis Cowie STACIA

## 2014-09-07 NOTE — Lactation Note (Signed)
This note was copied from the chart of Christy Zayah Javed. Lactation Consultation Note Initial visit made.  Providing Breastmilk for Your Baby in NICU booklet given.  Baby is 7 hours old and mom has pumped once and obtained a few drops of colostrum.  Reviewed pumping schedule and hand expression.  WIC referral sent to [facility] office for a breast pump after discharge.  Encouraged to call with concerns/assist prn.  Patient Name: Christy Bell [id number] Date: 09/07/2014 Reason for consult: Initial assessment;NICU baby   Maternal Data    Feeding    LATCH Score/Interventions                      Lactation Tools Discussed/Used WIC Program: Yes Pump Review: Setup, frequency, and cleaning;Milk Storage Initiated by:: RN Date initiated:: 09/07/14   Consult Status Consult Status: Follow-up Date: 09/08/14 Follow-up type: In-patient    Huston Foley 09/07/2014, 5:11 PM

## 2014-09-07 NOTE — Progress Notes (Signed)
Patient ID: Christy Bell, female   DOB: 1990/11/09, 24 y.o.   MRN: [id number]  24 yo G1P0  SIUP at 33 weeks 6 days admitted to antepartum 09/04/2014 for preeclampsia with severe features.  She is steroid complete s/p magnesium sulfate.  This morning her BPs have risen to 150's to 160 / 90s and she c/o epigastric pain.  The fetal heart tracing has had several episodes of spontaneous prolonged decelerations from baseline 150s nadir 80's,  Longest lasting 4 minutes.     SVE: 1/50/ high  Bedside US : breech presentation  Official US yesterday EFW 65% 4#10 oz, breech  A/P 33 week 6 day SIUP with preeclampsia with severe features now Cat III tracing Discussed case with MFM and consensus is to proceed with delivery.  Discussed with patient risks of primary cesarean delivery to include hemorrhage, infection, injury to  Vessels and organs. She voiced understanding and agrees to proceed.  Consent signed and placed in chart NPO,  IV Ancef on call to OR To OR for primary CD  [name] STACIA

## 2014-09-07 NOTE — Anesthesia Postprocedure Evaluation (Signed)
  Anesthesia Post-op Note  Patient: Christy Bell  Procedure(s) Performed: Procedure(s): CESAREAN SECTION (N/A)  Patient Location: ICU  Anesthesia Type:Spinal  Level of Consciousness: awake, alert  and oriented  Airway and Oxygen Therapy: Patient Spontanous Breathing  Post-op Pain: none  Post-op Assessment: Post-op Vital signs reviewed, Patient's Cardiovascular Status Stable, Respiratory Function Stable, Patent Airway, No signs of Nausea or vomiting, Pain level controlled, No headache, No backache and Patient able to bend at knees      Post-op Vital Signs: Reviewed and stable  Last Vitals:  Filed Vitals:   09/07/14 1300  BP: 149/92  Pulse: 79  Temp:   Resp:     Complications: No apparent anesthesia complications

## 2014-09-07 NOTE — Op Note (Signed)
Cesarean Section Procedure Note  Indications: Preterm SIUP at 33 weeks 6 days with preeclampsia with severe features; Category III tracing  Pre-operative Diagnosis: Category III tracing, preeclampsia with severe features  Post-operative Diagnosis: same  Surgeon: Wynonia Hazard   Assistants: none  Anesthesia: Spinal anesthesia  ASA Class: 2  Procedure Details   The patient was counseled about the risks, benefits, complications of the cesarean section using an interpreter. The patient concurred with the proposed plan, giving informed consent.  The site of surgery properly noted/marked. The patient was taken to Operating Room # 1, identified as Christy Bell, Christy Bell and the procedure verified as C-Section Delivery. A Time Out was held and the above information confirmed.  After spinal anesthesia was found to be adequate, the patient was placed in the dorsal supine position with a leftward tilt, the fetal heart beat after the spinal was in the 150s.  The abdomen was prepped and draped in normal sterile fashion. A Pfannenstiel incision was made and carried down through the subcutaneous tissue to the fascia.  The fascia was incised in the midline and the fascial incision was opened bluntly followed by opening the abdominal peritoneum bluntly using surgeons fingers. The Alexis retractor was then deployed. The scalpel was then used to make a low transverse incision on the uterus which was extended laterally with  blunt dissection. The fetal breech was identified, each leg was delivered  The body was delivered until the shoulders.  Each arm was delivered via Pinard maneuver. The head was delivered via Domenic Schwab, [place] maneuver. The A live female infant was bulb suctioned on the operative field cried vigorously, cord was clamped and cut and the infant was passed to the waiting neonatologist. Apgars 7/8. Cord gases and cord blood obtained. Placenta was then delivered spontaneously, intact and  appear normal, the uterus was cleared of all clot and debris. The uterine incision was repaired with #0 Monocryl in running locked fashion. A second imbricating suture was performed using the same suture. The incision was hemostatic. Palpable posterior / fundal small fibroid on uterus.  Ovaries and tubes were inspected and normal. The Alexis retractor was removed. The abdominal cavity was cleared of all clot and debris. The abdominal peritoneum was reapproximated with 2-0 chromic  in a running fashion, the rectus muscles was reapproximated with #2 chromic in interrupted fashion. The fascia was closed with 0 Vicryl in a running fashion. The subcuticular layer was irrigated and all bleeders cauterized.  20 mL of Exparel dilute in 0.25% Marcaine was injected into the fascial and subcuticular layer. The subcuticular layer was re-approximated with interrupted sutures of 2-0 plain.   The skin was closed with 4-0 vicryl in a subcuticular fashion using a Mellody Dance needle. The incision was covered with Dermabond. All sponge lap and needle counts were correct x3. Patient tolerated the procedure well and recovered in stable condition following the procedure.  Instrument, sponge, and needle counts were correct prior the abdominal closure and at the conclusion of the case.   Findings: Live female infant, Apgars 7/8, clear amniotic fluid, normal appearing placenta, fibroid uterus, normal bilateral tubes and ovaries  Estimated Blood Loss: 250 mL         Drains: Foley cathete, 300 mL clear urine at end of procedure         Specimens: Placenta to pathology         Implants: none         Complications:  None; patient tolerated the procedure well.  Disposition: PACU - hemodynamically stable.   Christy Bell

## 2014-09-08 ENCOUNTER — Encounter (HOSPITAL_COMMUNITY): Payer: Self-pay | Admitting: *Deleted

## 2014-09-08 LAB — CBC
HCT: 32.2 % — ABNORMAL LOW (ref 36.0–46.0)
Hemoglobin: 10.5 g/dL — ABNORMAL LOW (ref 12.0–15.0)
MCH: 27.8 pg (ref 26.0–34.0)
MCHC: 32.6 g/dL (ref 30.0–36.0)
MCV: 85.2 fL (ref 78.0–100.0)
Platelets: 249 10*3/uL (ref 150–400)
RBC: 3.78 MIL/uL — AB (ref 3.87–5.11)
RDW: 14.9 % (ref 11.5–15.5)
WBC: 12.4 10*3/uL — ABNORMAL HIGH (ref 4.0–10.5)

## 2014-09-08 LAB — RPR: RPR Ser Ql: NONREACTIVE

## 2014-09-08 MED ORDER — COMPLETENATE 29-1 MG PO CHEW
1.0000 | CHEWABLE_TABLET | Freq: Every day | ORAL | Status: DC
  Administered 2014-09-08 – 2014-09-12 (×5): 1 via ORAL
  Filled 2014-09-08 (×6): qty 1

## 2014-09-08 MED ORDER — IBUPROFEN 100 MG/5ML PO SUSP
600.0000 mg | Freq: Four times a day (QID) | ORAL | Status: DC
  Administered 2014-09-08 – 2014-09-12 (×17): 600 mg via ORAL
  Filled 2014-09-08 (×22): qty 30

## 2014-09-08 MED ORDER — LABETALOL HCL 100 MG PO TABS
50.0000 mg | ORAL_TABLET | Freq: Once | ORAL | Status: AC
  Administered 2014-09-08: 50 mg via ORAL
  Filled 2014-09-08: qty 0.5

## 2014-09-08 MED ORDER — LABETALOL HCL 100 MG PO TABS
50.0000 mg | ORAL_TABLET | Freq: Two times a day (BID) | ORAL | Status: DC
  Administered 2014-09-08: 50 mg via ORAL
  Filled 2014-09-08 (×2): qty 0.5

## 2014-09-08 MED ORDER — MEASLES, MUMPS & RUBELLA VAC ~~LOC~~ INJ
0.5000 mL | INJECTION | Freq: Once | SUBCUTANEOUS | Status: DC
  Filled 2014-09-08: qty 0.5

## 2014-09-08 NOTE — Progress Notes (Addendum)
  Patient is eating, ambulating, cath out but not voided yet.  Pain control is good.  Filed Vitals:   09/08/14 0500 09/08/14 0527 09/08/14 0600 09/08/14 0700  BP: 161/98  155/94 145/104  Pulse: 67  61 55  Temp:      TempSrc:      Resp:      Height:      Weight:  80.74 kg (178 lb)    SpO2: 98%  99% 98%    lungs:   clear to auscultation cor:    RRR Abdomen:  soft, appropriate tenderness, incisions intact and without erythema or exudate ex:    no cords   Lab Results  Component Value Date   WBC 12.4* 09/08/2014   HGB 10.5* 09/08/2014   HCT 32.2* 09/08/2014   MCV 85.2 09/08/2014   PLT 249 09/08/2014    --/--/A POS, A POS (07/31 0330)/RNI  A/P    Post operative day 1 with severe preeclampsia and still elevated BPs.  Routine post op and postpartum care.  Expect d/c in 2-3 days once BPs controlled.  Percocet for pain control. On MgSO4 - will continue until BPS normalized; output was 3525 but still is positive.  Needs MMR.  Will consider labetalol po if BPs trend 150/100.

## 2014-09-08 NOTE — Lactation Note (Addendum)
This note was copied from the chart of Christy Bell. Lactation Consultation Note  Patient Name: Christy Bell [id number] Date: 09/08/2014 Reason for consult: Follow-up assessment  With this mom of a NICU baby, now 76 hours old and 34 weeks CGA. Mom is still on magnesium drip in AICU. I assisted her with pumping - she expressed about 5 ml's of bloody colostrum. I explained to mom that this can be normal. Mom encouraged to pump every 2-3 hours, and to follow with hand expression. Wic fax sent. Bopyfriend present and involved. FOB not involved - boyfriend is not FOB. Mom knows to call for questions/concerns. Skin to skin encouraged once mom can go to visit her baby in the NICU.    Maternal Data    Feeding    LATCH Score/Interventions                      Lactation Tools Discussed/Used WIC Program: Yes (fax sent to [facility]) Pump Review: Setup, frequency, and cleaning;Milk Storage;Other (comment) (premie setting and hand expresion reviewed) Date initiated:: 09/07/14   Consult Status Consult Status: Follow-up Date: 09/09/14 Follow-up type: In-patient    Alfred Levins 09/08/2014, 10:12 AM

## 2014-09-09 MED ORDER — LABETALOL HCL 200 MG PO TABS
200.0000 mg | ORAL_TABLET | Freq: Three times a day (TID) | ORAL | Status: DC
  Administered 2014-09-09 (×3): 200 mg via ORAL
  Filled 2014-09-09 (×3): qty 1

## 2014-09-09 NOTE — Progress Notes (Signed)
Dr. Dareen Piano out of office.  Called RN and gave telephone order to change Labetalol to 200 mg three times a day.  Orders followed.  MHawkins, RN

## 2014-09-09 NOTE — Progress Notes (Signed)
Patient moved collection hat out of commode.  RN educated the patient on need to collect intake and output.  Patient verbalized understanding.

## 2014-09-09 NOTE — Progress Notes (Addendum)
1540 - Patient reports seeing a "small spot" in her vision and difficulty focusing.  DTR +1, no clonus, denies headache.  Reports epigastric pain now gone after eating some dry [employer] cereal.  Patient reports tremors in hands and muscle jerk/twitching.  Also states that "the back of my head tingles when I yawn, and it feels like someone is touching my head at times.  I will scratch my head and it tingles."  Dr. Dareen Piano notified of above and aware of BP.  No new orders.  Will continue to monitor.

## 2014-09-09 NOTE — Progress Notes (Signed)
Pt doing well. She has minimal incisional pain and LE edema.  B/Ps are elevated IMP/ Resolving Preeclampsia Plan/ Transfer to floor.            Tx HTN.

## 2014-09-10 MED ORDER — LABETALOL HCL 200 MG PO TABS
300.0000 mg | ORAL_TABLET | Freq: Three times a day (TID) | ORAL | Status: DC
  Administered 2014-09-10 (×3): 300 mg via ORAL
  Administered 2014-09-10: 100 mg via ORAL
  Filled 2014-09-10 (×6): qty 1

## 2014-09-10 MED ORDER — SERTRALINE HCL 50 MG PO TABS
50.0000 mg | ORAL_TABLET | Freq: Every day | ORAL | Status: DC
  Administered 2014-09-10 – 2014-09-12 (×3): 50 mg via ORAL
  Filled 2014-09-10 (×4): qty 1

## 2014-09-10 NOTE — Progress Notes (Signed)
Patient is doing well.  She is tolerating PO, ambulating, voiding.  Pain is controlled.  Lochia is appropriate. No HA/BV/RUQ pain.  Spoke w CSW today re: concern given h/o anxiety/depression.  Currently mood is stable, but she does desire to restart medications.  Previously on prozac  Filed Vitals:   09/10/14 0136 09/10/14 0245 09/10/14 0605 09/10/14 1140  BP: 162/96 160/95 146/92 136/88  Pulse: 85 63 65 73  Temp: 98.7 F (37.1 C)  99.1 F (37.3 C) 98.7 F (37.1 C)  TempSrc: Oral  Oral Oral  Resp: Height:      Weight:   80.967 kg (178 lb 8 oz)   SpO2: 99%  100% 99%    NAD Abdomen:  soft, appropriate tenderness, incisions intact and without erythema or drainage ext:    Symmetric, 2+ edema bilaterally  Lab Results  Component Value Date   WBC 12.4* 09/08/2014   HGB 10.5* 09/08/2014   HCT 32.2* 09/08/2014   MCV 85.2 09/08/2014   PLT 249 09/08/2014    --/--/A POS, A POS (07/31 0330)  A/P    24 y.o. G1P0101 POD #3 s/p 1 LTCS 2/2 preeclampsia w severe features BPs 150-160s yesterday, labetalol titrated up, increased again this AM to 300 mg PO tid today.  Control improving, but will continue to monitor as inpatient until BPs optimized H/o anxiety/depression: desires to restart meds.  Prefer safety profile of zoloft. As she has been off meds during the duration of pregnancy, discussed trial of zoloft.  If suboptimal response, will return to prozac.

## 2014-09-10 NOTE — Clinical Social Work Maternal (Signed)
CLINICAL SOCIAL WORK MATERNAL/CHILD NOTE  Patient Details  Name: Christy Bell MRN: [id number] Date of Birth: 12-Dec-1990  Date:  09/09/2014 (Late Entry)  Clinical Social Worker Initiating Note:  Courney Garrod E. Brigitte Pulse, [place] Date/ Time Initiated:  09/09/14/0400     Child's Name:  [facility]   Legal Guardian:  Mother   Need for Interpreter:  None   Date of Referral:        Reason for Referral:   (No referral-NICU admission)   Referral Source:      Address:  [address], [place], [place] [address]  Phone number:  [phone]   Household Members:  Parents, Siblings (MOB states that she lives with her mother and father and younger brother)   Natural Supports (not living in the home):  Spouse/significant other, Church, Immediate Family (FOB is not involved-MOB is currently in a relationship and he is assuming the role of baby's father.)   Professional Supports: Other (Comment) (CSW recommends outpatient counseling and medication management by a psychiatrist.)   Employment:     Type of Work:  (MOB plans to look for work at some point.  Her boyfrend works at [employer] and [employer])   Education:      Museum/gallery curator Resources:  Medicaid   Other Resources:  [facility]   Cultural/Religious Considerations Which May Impact Care: MOB reports that faith is very important to her.  She states her father is an Conservation officer, historic buildings at [employer].    Strengths:  Ability to meet basic needs , Other (Comment) (MOB states she plans to take baby to the pediatric office where her sister's baby goes.  She cannot recall the name, but knows to inform NICU staff once she confirms.)   Risk Factors/Current Problems:  Mental Health Concerns  (Anxiety and Depression)   Cognitive State:  Alert , Goal Oriented , Linear Thinking    Mood/Affect:  Interested , Relaxed    CSW Assessment: CSW met with MOB in her third floor room/303 to introduce myself, offer support, and complete assessment due to NICU  admission.  CSW notes in chart review that MOB has a hx of Depression and Anxiety as well as a hx of marijuana use.   MOB was initially quiet, but presented in a pleasant mood and welcomed CSW intervention.  She states her boyfriend is on the way, but that we can discuss anything with him present.  MOB quickly became very talkative and open to sharing her emotions related to her pregnancy and baby's early delivery.  She reports that she does not feel ready to be a mother and that it has not set in yet that she really has a baby.  CSW validated her feelings and discussed, at length, the importance of monitoring emotions and caring for mental health.  MOB told CSW that she could never imagine harming her baby, but is scared that there has been mothers who have harmed their babies because of perinatal mood disorders.  MOB states she was very depressed when she found out that she was pregnant, but decided that "all children are a blessing from God" and that she was not going to dwell on her past mistakes.  She states attending the young adult services at her church helped alleviate her depressive symptoms.  She states she did not instantly feel bonded to her daughter, as she thought she would, and feels like baby's admission to NICU from the delivery room is a main reason for this.  CSW normalized this, while explaining that if  she feels an inability to bond with her baby moving forward, CSW encouraged her to talk to a professional as this is a symptom of PPD.  CSW provided education on other signs and symptoms to watch for and the importance of talking with a professional if symptoms arise.  MOB was very engaged and attentive.  She agrees to monitor for symptoms and is understanding of the impact her emotions can have on herself and her baby.  MOB reports that she was on Prozac prior to pregnancy and together with CSW decides that she would like to resume her medication.  CSW commends MOB for taking the necessary  steps in caring for herself.  CSW also recommends outpatient counseling to help her cope with her emotions.  MOB states she had a Teacher, music and counselor at [facility] in [place], [place] when she was in school at [employer].  She states she has stopped going to school at this time and that she does not have providers in this area.  CSW will discuss restarting medication with OB prior to MOB's discharge.  MOB agrees.  CSW provided MOB with outpatient follow up options for medication management and counseling.  MOB states willingness to seek treatment.   CSW inquired about MOB's hx of marijuana use.  She admits to smoking marijuana once since finding out she was pregnant.  She states no intention of using moving forward.  CSW explained hospital drug screen policy and mandatory reporting for positive screens.  MOB was concerned and asked if she needs to tell her boyfriend and parents at this time.  CSW informed her that it is up to her who she talks to about this, but that baby's UDS is negative and CSW will not make a report for substances unless the MDS returns positive.  CSW encouraged her to talk with her support people if a report is made since MOB lives with her parents and Child Protective Services will go to the home.  MOB requests that CSW contact her with the results.  CSW agreed.  MOB states no other drug use and reports that her boyfriend "doesn't believe in smoking marijuana."   MOB reports having limited supplies for baby at this time, but feels she will have everything she needs prior to baby's discharge.  She reports that she has a baby shower scheduled for 09/19/14.  CSW asked that she let CSW know if she is having difficulty getting basic needs for baby prior to discharge.  CSW provided education on safe sleep/SIDS precautions and the importance of getting a bed for baby prior to her discharge.  MOB states baby will have her own bed.   MOB states the biological father, [place], lives in  [place], [place] and has not been involved or supportive throughout the pregnancy.  Current boyfriend/Ronald Megan Salon appears very loving towards MOB.  Boyfriend lives in [place].  MOB states she spends a lot of time at his home. CSW explained ongoing support services offered by NICU CSW and gave contact information.  CSW will speak with MD regarding prescription for an antidepressant prior to MOB's discharge.  CSW Plan/Description:  Information/Referral to [employer] , Psychosocial Support and Ongoing Assessment of Needs, Patient/Family Education     Alphonzo Cruise, [place] 09/10/2014, 11:17 AM

## 2014-09-10 NOTE — Lactation Note (Signed)
This note was copied from the chart of Christy Aljean Kimmey. Lactation Consultation Note  Patient Name: Christy Bell [id number] Date: 09/10/2014 Reason for consult: Follow-up assessment   With this mom of a NICU baby, now 36 hours old, and 34 2/7 weeks CGA, weighing 3 lbs 14 oz. Mom has been puttting baby to breast, and i explained how this is good for both her and baby, but I do not expect for her to be transferring much milk at her gestation and size. Mom is expressing about 45 mls now, but has not been consistently pumping. I advised her to pump every 2 hours during the day, and go no more than 5 hours at night to sleep, or every 3 hours around the clock, pumping at least 8 times a day. I reviewed with mom the importance of the first 2 weeks to milk supply. I gave mom paper work for a DEP [employer], since she will probably go home this W/E   Maternal Data    Feeding Feeding Type: Breast Milk Nipple Type: Slow - flow Length of feed: 30 min  LATCH Score/Interventions                      Lactation Tools Discussed/Used WIC Program: Yes Pump Review: Setup, frequency, and cleaning   Consult Status Consult Status: Follow-up Date: 09/11/14 Follow-up type: In-patient    Alfred Levins 09/10/2014, 10:45 AM

## 2014-09-10 NOTE — Progress Notes (Signed)
Psychosocial assessment completed due to NICU admission and hx of Anxiety and Depression.  Full documentation to follow. 

## 2014-09-11 LAB — COMPREHENSIVE METABOLIC PANEL
ALK PHOS: 106 U/L (ref 38–126)
ALT: 26 U/L (ref 14–54)
ANION GAP: 9 (ref 5–15)
AST: 33 U/L (ref 15–41)
Albumin: 2.6 g/dL — ABNORMAL LOW (ref 3.5–5.0)
BUN: 7 mg/dL (ref 6–20)
CALCIUM: 8.2 mg/dL — AB (ref 8.9–10.3)
CHLORIDE: 107 mmol/L (ref 101–111)
CO2: 22 mmol/L (ref 22–32)
CREATININE: 0.57 mg/dL (ref 0.44–1.00)
GFR calc Af Amer: >60 mL/min (ref >60)
Glucose, Bld: 88 mg/dL (ref 65–99)
Potassium: 4.5 mmol/L (ref 3.5–5.1)
SODIUM: 138 mmol/L (ref 135–145)
TOTAL PROTEIN: 5.3 g/dL — AB (ref 6.5–8.1)
Total Bilirubin: 0.4 mg/dL (ref 0.3–1.2)

## 2014-09-11 LAB — CBC
HEMATOCRIT: 27.8 % — AB (ref 36.0–46.0)
Hemoglobin: 8.9 g/dL — ABNORMAL LOW (ref 12.0–15.0)
MCH: 27.6 pg (ref 26.0–34.0)
MCHC: 32 g/dL (ref 30.0–36.0)
MCV: 86.3 fL (ref 78.0–100.0)
Platelets: 222 10*3/uL (ref 150–400)
RBC: 3.22 MIL/uL — ABNORMAL LOW (ref 3.87–5.11)
RDW: 15.2 % (ref 11.5–15.5)
WBC: 9.5 10*3/uL (ref 4.0–10.5)

## 2014-09-11 MED ORDER — NIFEDIPINE ER 30 MG PO TB24
30.0000 mg | ORAL_TABLET | Freq: Every day | ORAL | Status: DC
  Administered 2014-09-11 – 2014-09-12 (×2): 30 mg via ORAL
  Filled 2014-09-11 (×3): qty 1

## 2014-09-11 MED ORDER — LABETALOL HCL 5 MG/ML IV SOLN
20.0000 mg | Freq: Once | INTRAVENOUS | Status: AC
  Administered 2014-09-11: 20 mg via INTRAVENOUS

## 2014-09-11 MED ORDER — NIFEDIPINE 10 MG PO CAPS
10.0000 mg | ORAL_CAPSULE | Freq: Once | ORAL | Status: AC
Start: 2014-09-11 — End: 2014-09-11
  Administered 2014-09-11: 10 mg via ORAL
  Filled 2014-09-11: qty 1

## 2014-09-11 MED ORDER — LABETALOL HCL 200 MG PO TABS: 400.0000 mg | ORAL_TABLET | Freq: Three times a day (TID) | ORAL | Status: DC

## 2014-09-11 MED ORDER — LABETALOL HCL 5 MG/ML IV SOLN
INTRAVENOUS | Status: AC
  Filled 2014-09-11: qty 4

## 2014-09-11 NOTE — Progress Notes (Signed)
Patient with severe range BPs overnight in 170s/100s.  Received 2 doses of IV labetalol, but quickly returned to severe range.  Received 1 dose short acting procardia w good results.  Asymptomatic at this time.  Reports swelling in LE is decreasing significantly  Filed Vitals:   09/11/14 0130 09/11/14 0618 09/11/14 0758 09/11/14 0937  BP: 153/84 171/101 135/82 128/71  Pulse: 83 76  98  Temp:  98.3 F (36.8 C)  99 F (37.2 C)  TempSrc:  Oral  Oral  Resp:  18  20  Height:      Weight:  77.905 kg (171 lb 12 oz)    SpO2:  100%  99%    NAD Abdomen:  soft, appropriate tenderness, incisions intact and without erythema or drainage ext:    Symmetric, 1+ edema bilaterally  Lab Results  Component Value Date   WBC 12.4* 09/08/2014   HGB 10.5* 09/08/2014   HCT 32.2* 09/08/2014   MCV 85.2 09/08/2014   PLT 249 09/08/2014    --/--/A POS, A POS (07/31 0330)  A/P    24 y.o. G1P0101 POD #4 s/p 1 LTCS 2/2 preeclampsia w severe features Severe range Bps overnight, now improved s/p short acting procardia.  D/c labetalol and transitioned to procardia Xl .  Will monitor BPs more closely today and titrate dosage prn.  Repeat cbc/cmp pending now.   H/o anxiety/depression: zoloft started yesterday If BPs stabilize today, plan d/c tomorrow.

## 2014-09-12 MED ORDER — SERTRALINE HCL 50 MG PO TABS: 50.0000 mg | ORAL_TABLET | Freq: Every day | ORAL | Status: DC

## 2014-09-12 MED ORDER — OXYCODONE-ACETAMINOPHEN 5-325 MG PO TABS: 1.0000 | ORAL_TABLET | Freq: Four times a day (QID) | ORAL | Status: DC | PRN

## 2014-09-12 MED ORDER — NIFEDIPINE ER 30 MG PO TB24: 30.0000 mg | ORAL_TABLET | Freq: Every day | ORAL | Status: DC

## 2014-09-12 NOTE — Progress Notes (Signed)
BPs much improved w switch to procardia XL.  Asymptomatic at this time.  Reports swelling in LE is decreasing significantly  Filed Vitals:   09/11/14 2050 09/11/14 2230 09/12/14 0209 09/12/14 0551  BP: 143/90 130/79 137/95 146/96  Pulse:  96 97 82  Temp:  98.4 F (36.9 C) 98.6 F (37 C) 98.8 F (37.1 C)  TempSrc:  Oral Oral Oral  Resp:  Height:      Weight:    75.07 kg (165 lb 8 oz)  SpO2:  98% 98% 99%    NAD Abdomen:  soft, appropriate tenderness, incisions intact and without erythema or drainage ext:    Symmetric, 1+ edema bilaterally  Lab Results  Component Value Date   WBC 9.5 09/11/2014   HGB 8.9* 09/11/2014   HCT 27.8* 09/11/2014   MCV 86.3 09/11/2014   PLT 222 09/11/2014    --/--/A POS, A POS (07/31 0330)  A/P    24 y.o. G1P0101 POD #5 s/p 1 LTCS 2/2 preeclampsia w severe features BPs now stable w procardia XL  daily.  Will d/c today, plan f/u BP check in 1 week H/o anxiety/depression: zoloft started yesterday

## 2014-09-12 NOTE — Discharge Summary (Signed)
Obstetric Discharge Summary Reason for Admission: cesarean section Prenatal Procedures: NST and Preeclampsia Intrapartum Procedures: cesarean: low cervical, transverse Postpartum Procedures: none Complications-Operative and Postpartum: none HEMOGLOBIN  Date Value Ref Range Status  09/11/2014 8.9* 12.0 - 15.0 g/dL Final   HCT  Date Value Ref Range Status  09/11/2014 27.8* 36.0 - 46.0 % Final    Physical Exam:  General: alert, cooperative and appears stated age 24: appropriate Uterine Fundus: firm Incision: healing well, no significant drainage DVT Evaluation: No evidence of DVT seen on physical exam.  Discharge Diagnoses: Preelampsia  Discharge Information: Date: 09/12/2014 Activity: pelvic rest Diet: routine Medications: PNV, Ibuprofen, Colace, Iron, Percocet and procardia xl 30 Condition: stable Instructions: refer to practice specific booklet Discharge to: home Follow-up Information    Follow up with PINN, Christy Mae, MD In 1 week.   Specialty:  Obstetrics and Gynecology   Contact information:   [address] [place] [address] [phone]       Newborn Data: Live born female  Birth Weight: 3 lb 15.5 oz (1800 g) APGAR: 7, 8  Home with mother.  Christy Bell Christy Bell Christy Bell 09/12/2014, 8:00 AM

## 2014-09-12 NOTE — Lactation Note (Signed)
This note was copied from the chart of Christy Bell. Lactation Consultation Note  Patient Name: Christy Bell [id number] Date: 09/12/2014 Reason for consult: Follow-up assessment;Pump rental;NICU baby  NICU baby 51 days old. [redacted]w[redacted]d CGA. Mom given [facility] loaner pump. Mom is getting dressed after showering, and MGma completed [facility] loaner transaction. Enc MOB to call for assistance as needed. MOB aware of pumping rooms in NICU. Maternal Data    Feeding Feeding Type: Breast Milk Length of feed: 45 min  LATCH Score/Interventions                      Lactation Tools Discussed/Used     Consult Status Consult Status: PRN    Geralynn Ochs 09/12/2014, 3:54 PM

## 2014-09-12 NOTE — Discharge Instructions (Signed)

## 2014-09-12 NOTE — Progress Notes (Signed)
Discharge teaching complete. Pt understood all information and did not have any questions. Pt ambulated out of the hospital and discharged home to family.  

## 2014-09-13 ENCOUNTER — Ambulatory Visit: Payer: Self-pay

## 2014-09-13 NOTE — Lactation Note (Signed)
This note was copied from the chart of Christy Bell. Lactation Consultation Note  Patient Name: Christy Bell Date: 09/13/2014 Reason for consult: Follow-up assessment;NICU baby NICU 43 days old. Mom at baby's bedside in NICU and called for [facility] assistance for breast engorgement. Mom initially states that she is trying to pump every 3 hours, but after further discussion mom reports that she has pumped twice in the last 12 hours. Enc mom to pump at least 8 times/24 hours for 15 minutes, 20 minutes if milk still flowing well. Discussed that mom can sleep for 5 hours at night, but will need to pump more frequently in the morning to catch up. Also discussed pumping if her breast fullness wakes her up. Discussed cold and warm applications to breast, and massaging breasts while pumping. Enc mom to convert a sports bra for hands-free pumping so she can massage while pumping. Gave mom a hand pump and got her started pumping and mom's milk flowed easily. Discussed the need to keep milk flowing to maintain a good supply and to avoid engorgement/mastitis. Enc mom to call for assistance as needed.   Maternal Data    Feeding Feeding Type: Breast Milk Length of feed: 45 min  LATCH Score/Interventions                      Lactation Tools Discussed/Used     Consult Status Consult Status: PRN    Geralynn Ochs 09/13/2014, 2:38 PM

## 2014-09-20 ENCOUNTER — Ambulatory Visit: Payer: Self-pay

## 2014-09-20 NOTE — Lactation Note (Signed)
This note was copied from the chart of Christy Bell. Lactation Consultation Note  Patient Name: Christy Bell [id number] Date: 09/20/2014 Reason for consult: Follow-up assessment   With this mom and NICU baby, now 71 days old, and 35 5/7 weeks CGA. She is small, weighing 4 lbs 8.9 oz today. She was eager to latch, but would not suckle until a 20 nipple shield applied. She suckled on and off whilel at the breast, and I advised mom to let her be NG fed while at the breast. LPI infant and breast feeding reviewed briefly with mom. Mom has a good milk supply, expressing up to 4 oz at a time, 8 times a day. Since mom's milk supply is established, I suggested mom try pumping every 2-3 during the day, so she could  Sleep for a 5 hours stretch at night, or  Pump every 4 hours at night twice, but still have 8 pumpings a 24 hours period  Be her goal. Mom encouraged to continue latching the baby about once a day, as tolerated. Mom also advised to sign up for lactation consult in the book at the desk.     Feeding Feeding Type: Breast Fed Length of feed: 35 min (15 min nipple and 20 min gavage)  LATCH Score/Interventions Latch: Repeated attempts needed to sustain latch, nipple held in mouth throughout feeding, stimulation needed to elicit sucking reflex. (baby latchec eagerly without nipple shiled, but would not suckle until 20 nipple shield applied) Intervention(s): Skin to skin;Teach feeding cues;Waking techniques Intervention(s): Assist with latch;Breast massage;Breast compression  Audible Swallowing: None  Type of Nipple: Everted at rest and after stimulation  Comfort (Breast/Nipple): Soft / non-tender     Hold (Positioning): Assistance needed to correctly position infant at breast and maintain latch. Intervention(s): Breastfeeding basics reviewed;Support Pillows;Position options;Skin to skin  LATCH Score: 6  Lactation Tools Discussed/Used Tools: Nipple Shields Nipple shield  size: 20   Consult Status Consult Status: PRN Follow-up type: In-patient (NICU)    Alfred Levins 09/20/2014, 2:49 PM

## 2014-12-14 ENCOUNTER — Encounter (HOSPITAL_COMMUNITY): Payer: Self-pay | Admitting: Vascular Surgery

## 2014-12-14 ENCOUNTER — Emergency Department (HOSPITAL_COMMUNITY)
Admission: EM | Admit: 2014-12-14 | Discharge: 2014-12-15 | Disposition: A | Discharge status: Home / Self Care | Payer: Medicaid Other | Attending: Emergency Medicine | Admitting: Emergency Medicine

## 2014-12-14 DIAGNOSIS — Z87891 Personal history of nicotine dependence: Secondary | ICD-10-CM | POA: Insufficient documentation

## 2014-12-14 DIAGNOSIS — Z79899 Other long term (current) drug therapy: Secondary | ICD-10-CM | POA: Insufficient documentation

## 2014-12-14 DIAGNOSIS — N898 Other specified noninflammatory disorders of vagina: Secondary | ICD-10-CM | POA: Diagnosis present

## 2014-12-14 DIAGNOSIS — B373 Candidiasis of vulva and vagina: Secondary | ICD-10-CM | POA: Insufficient documentation

## 2014-12-14 DIAGNOSIS — F329 Major depressive disorder, single episode, unspecified: Secondary | ICD-10-CM | POA: Insufficient documentation

## 2014-12-14 DIAGNOSIS — Z3202 Encounter for pregnancy test, result negative: Secondary | ICD-10-CM | POA: Diagnosis not present

## 2014-12-14 DIAGNOSIS — J45909 Unspecified asthma, uncomplicated: Secondary | ICD-10-CM | POA: Insufficient documentation

## 2014-12-14 DIAGNOSIS — B3731 Acute candidiasis of vulva and vagina: Secondary | ICD-10-CM

## 2014-12-14 HISTORY — DX: Unspecified pre-eclampsia, unspecified trimester: O14.90

## 2014-12-14 LAB — URINE MICROSCOPIC-ADD ON

## 2014-12-14 LAB — URINALYSIS, ROUTINE W REFLEX MICROSCOPIC
Glucose, UA: NEGATIVE mg/dL
HGB URINE DIPSTICK: NEGATIVE
Ketones, ur: 15 mg/dL — AB
Nitrite: NEGATIVE
Protein, ur: NEGATIVE mg/dL
SPECIFIC GRAVITY, URINE: 1.03 (ref 1.005–1.030)
Urobilinogen, UA: 1 mg/dL (ref 0.0–1.0)
pH: 6 (ref 5.0–8.0)

## 2014-12-14 LAB — WET PREP, GENITAL
Clue Cells Wet Prep HPF POC: NONE SEEN
TRICH WET PREP: NONE SEEN

## 2014-12-14 LAB — POC URINE PREG, ED: Preg Test, Ur: NEGATIVE

## 2014-12-14 NOTE — ED Notes (Signed)
Pt reports to the ED for eval of vaginal d/c. She reports she has been having white vaginal discharge and irritation. Pt reports she has tried monistat but it made her have severe burning, itching, and pain. She last used it today around 5912. Pt also reporting some dysuria, urgency, and frequency. Pt denies any abd pain or N/V/D. Pt A&Ox4, resp e/u, and skin warm and dry.

## 2014-12-14 NOTE — ED Notes (Signed)
PA at bedside.

## 2014-12-14 NOTE — ED Provider Notes (Signed)
CSN: [id number]     Arrival date & time 12/14/14  2112 History  By signing my name below, I, Christy Bell, attest that this documentation has been prepared under the direction and in the presence of Emilia BeckKaitlyn Kannan Proia, PA-C Electronically Signed: Jarvis Morganaylor Bell, ED Scribe. 12/14/2014. 11:01 PM.      Chief Complaint  Patient presents with  . Vaginal Discharge   The history is provided by the patient. No language interpreter was used.    HPI Comments: Christy Bell is a 24 y.o. female with a h/o asthma who presents to the Emergency Department complaining of intermittent, moderate, yellow vaginal discharge onset 2 days. She reports associated vaginal irritation/itching, and dysuria. Pt states she has concern for a yeast infection. She notes she tried Monistat earlier today with no relief and reports that she felt severe burning after using. Her last use of Monistat was 11 hours ago. Pt states that both her sister and mother are allergic to Monistat. Pt notes she is 3 months post partum. She denies abdominal pain, nausea, vomiting or diarrhea.   Past Medical History  Diagnosis Date  . Depression   . Asthma   . Preeclampsia    Past Surgical History  Procedure Laterality Date  . Cesarean section N/A 09/07/2014    Procedure: CESAREAN SECTION;  Surgeon: Essie HartWalda Pinn, MD;  Location: [facility];  Service: Obstetrics;  Laterality: N/A;   No family history on file. Social History  Substance Use Topics  . Smoking status: Former Smoker    Quit date: 02/07/2014  . Smokeless tobacco: None  . Alcohol Use: 0.0 oz/week    0 Standard drinks or equivalent per week   OB History    Gravida Para Term Preterm AB TAB SAB Ectopic Multiple Living   1 1  1      0 1     Review of Systems  Genitourinary: Positive for vaginal discharge and vaginal pain.  All other systems reviewed and are negative.     Allergies  Review of patient's allergies indicates no known allergies.  Home Medications   Prior  to Admission medications   Medication Sig Start Date End Date Taking? Authorizing Provider  famotidine (PEPCID) 20 MG tablet Take 1 tablet (20 mg total) by mouth 2 (two) times daily. 08/04/14   Everlene FarrierWilliam Dansie, PA-C  NIFEdipine (PROCARDIA-XL/ADALAT CC) 30 MG 24 hr tablet Take 1 tablet (30 mg total) by mouth daily. 09/12/14   Marlow Baarsyanna Clark, MD  oxyCODONE-acetaminophen (PERCOCET/ROXICET) 5-325 MG per tablet Take 1-2 tablets by mouth every 6 (six) hours as needed for severe pain. 09/12/14   Marlow Baarsyanna Clark, MD  sertraline (ZOLOFT) 50 MG tablet Take 1 tablet (50 mg total) by mouth daily. 09/12/14   Marlow Baarsyanna Clark, MD   Triage Vitals: BP 126/81 mmHg  Pulse 109  Temp(Src) 99.1 F (37.3 C) (Oral)  Resp 16  SpO2 100%  Breastfeeding? Yes  Physical Exam  Constitutional: She is oriented to person, place, and time. She appears well-developed and well-nourished. No distress.  HENT:  Head: Normocephalic and atraumatic.  Eyes: Conjunctivae and EOM are normal.  Neck: Neck supple. No tracheal deviation present.  Cardiovascular: Normal rate and regular rhythm.  Exam reveals no gallop and no friction rub.   No murmur heard. Pulmonary/Chest: Effort normal and breath sounds normal. No respiratory distress. She has no wheezes. She has no rales. She exhibits no tenderness.  Abdominal: Soft. There is no tenderness.  Genitourinary: Vagina normal.  Normal vagina. Thick, white, vaginal discharge. Cervical os  closed. No CMT.   Musculoskeletal: Normal range of motion.  Neurological: She is alert and oriented to person, place, and time. Coordination normal.  Speech is goal-oriented. Moves limbs without ataxia.   Skin: Skin is warm and dry.  Psychiatric: She has a normal mood and affect. Her behavior is normal.  Nursing note and vitals reviewed.   ED Course  Procedures (including critical care time)  DIAGNOSTIC STUDIES: Oxygen Saturation is 100% on RA, normal by my interpretation.    COORDINATION OF CARE:    Labs  Review Labs Reviewed  URINALYSIS, ROUTINE W REFLEX MICROSCOPIC (NOT AT [facility]) - Abnormal; Notable for the following:    APPearance CLOUDY (*)    Bilirubin Urine SMALL (*)    Ketones, ur 15 (*)    Leukocytes, UA LARGE (*)    All other components within normal limits  URINE MICROSCOPIC-ADD ON - Abnormal; Notable for the following:    Squamous Epithelial / LPF MANY (*)    Bacteria, UA MANY (*)    All other components within normal limits  WET PREP, GENITAL  HIV ANTIBODY (ROUTINE TESTING)  POC URINE PREG, ED  GC/CHLAMYDIA PROBE AMP ([place]) NOT AT [facility]    Imaging Review No results found. I have personally reviewed and evaluated these images and lab results as part of my medical decision-making.   EKG Interpretation None      MDM   Final diagnoses:  Vaginal yeast infection    Patient has a vagina yeast infection. Patient's urinalysis likely contaminated. I doubt UTI with out symptoms at this time. Patient will have diflucan.   I personally performed the services described in this documentation, which was scribed in my presence. The recorded information has been reviewed and is accurate.    Emilia Beck, PA-C 12/15/14 0040  Pricilla Loveless, MD 12/19/14 1625

## 2014-12-15 LAB — HIV ANTIBODY (ROUTINE TESTING W REFLEX): HIV SCREEN 4TH GENERATION: NONREACTIVE

## 2014-12-15 LAB — GC/CHLAMYDIA PROBE AMP (~~LOC~~) NOT AT ARMC
Chlamydia: NEGATIVE
Neisseria Gonorrhea: NEGATIVE

## 2014-12-15 MED ORDER — FLUCONAZOLE 100 MG PO TABS
150.0000 mg | ORAL_TABLET | Freq: Once | ORAL | Status: AC
  Administered 2014-12-15: 150 mg via ORAL
  Filled 2014-12-15: qty 2

## 2014-12-15 NOTE — Discharge Instructions (Signed)
You have been treated for a vaginal yeast infection here. Refer to attached documents for more information.

## 2014-12-15 NOTE — ED Notes (Signed)
Pt ambulating independently w/ steady gait on d/c in no acute distress, A&Ox4. D/c instructions reviewed w/ pt - pt denies any further questions or concerns at present.  

## 2015-04-21 ENCOUNTER — Emergency Department (HOSPITAL_COMMUNITY): Payer: Medicaid Other

## 2015-04-21 ENCOUNTER — Encounter (HOSPITAL_COMMUNITY): Payer: Self-pay | Admitting: Nurse Practitioner

## 2015-04-21 ENCOUNTER — Emergency Department (HOSPITAL_COMMUNITY)
Admission: EM | Admit: 2015-04-21 | Discharge: 2015-04-21 | Disposition: A | Discharge status: Home / Self Care | Payer: Medicaid Other | Attending: Emergency Medicine | Admitting: Emergency Medicine

## 2015-04-21 DIAGNOSIS — Y9289 Other specified places as the place of occurrence of the external cause: Secondary | ICD-10-CM | POA: Insufficient documentation

## 2015-04-21 DIAGNOSIS — Y999 Unspecified external cause status: Secondary | ICD-10-CM | POA: Insufficient documentation

## 2015-04-21 DIAGNOSIS — S93602A Unspecified sprain of left foot, initial encounter: Secondary | ICD-10-CM | POA: Diagnosis not present

## 2015-04-21 DIAGNOSIS — J45909 Unspecified asthma, uncomplicated: Secondary | ICD-10-CM | POA: Insufficient documentation

## 2015-04-21 DIAGNOSIS — Y9301 Activity, walking, marching and hiking: Secondary | ICD-10-CM | POA: Diagnosis not present

## 2015-04-21 DIAGNOSIS — Z79899 Other long term (current) drug therapy: Secondary | ICD-10-CM | POA: Diagnosis not present

## 2015-04-21 DIAGNOSIS — S99922A Unspecified injury of left foot, initial encounter: Secondary | ICD-10-CM | POA: Diagnosis present

## 2015-04-21 DIAGNOSIS — Z793 Long term (current) use of hormonal contraceptives: Secondary | ICD-10-CM | POA: Insufficient documentation

## 2015-04-21 DIAGNOSIS — F329 Major depressive disorder, single episode, unspecified: Secondary | ICD-10-CM | POA: Diagnosis not present

## 2015-04-21 DIAGNOSIS — Z87891 Personal history of nicotine dependence: Secondary | ICD-10-CM | POA: Diagnosis not present

## 2015-04-21 DIAGNOSIS — X509XXA Other and unspecified overexertion or strenuous movements or postures, initial encounter: Secondary | ICD-10-CM | POA: Insufficient documentation

## 2015-04-21 IMAGING — CR DG FOOT COMPLETE 3+V*L*
3 series · 3 of 3 positions shown · non-contrast
Comparison: None.

CLINICAL DATA: Left lateral foot pain.

EXAM:
LEFT FOOT - COMPLETE 3+ VIEW

[foot ap]
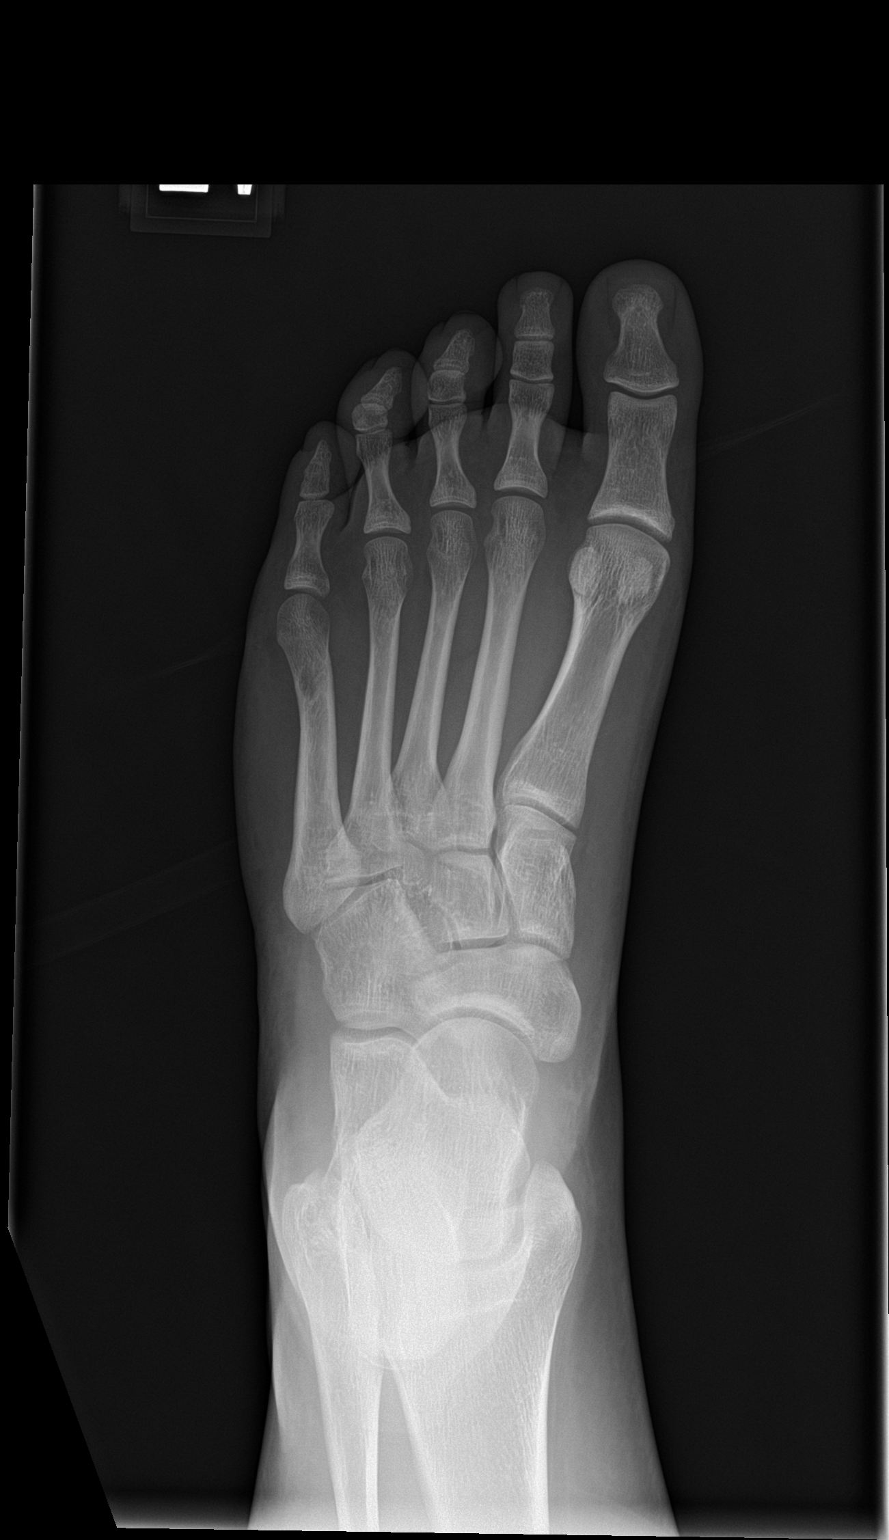

[foot obl]
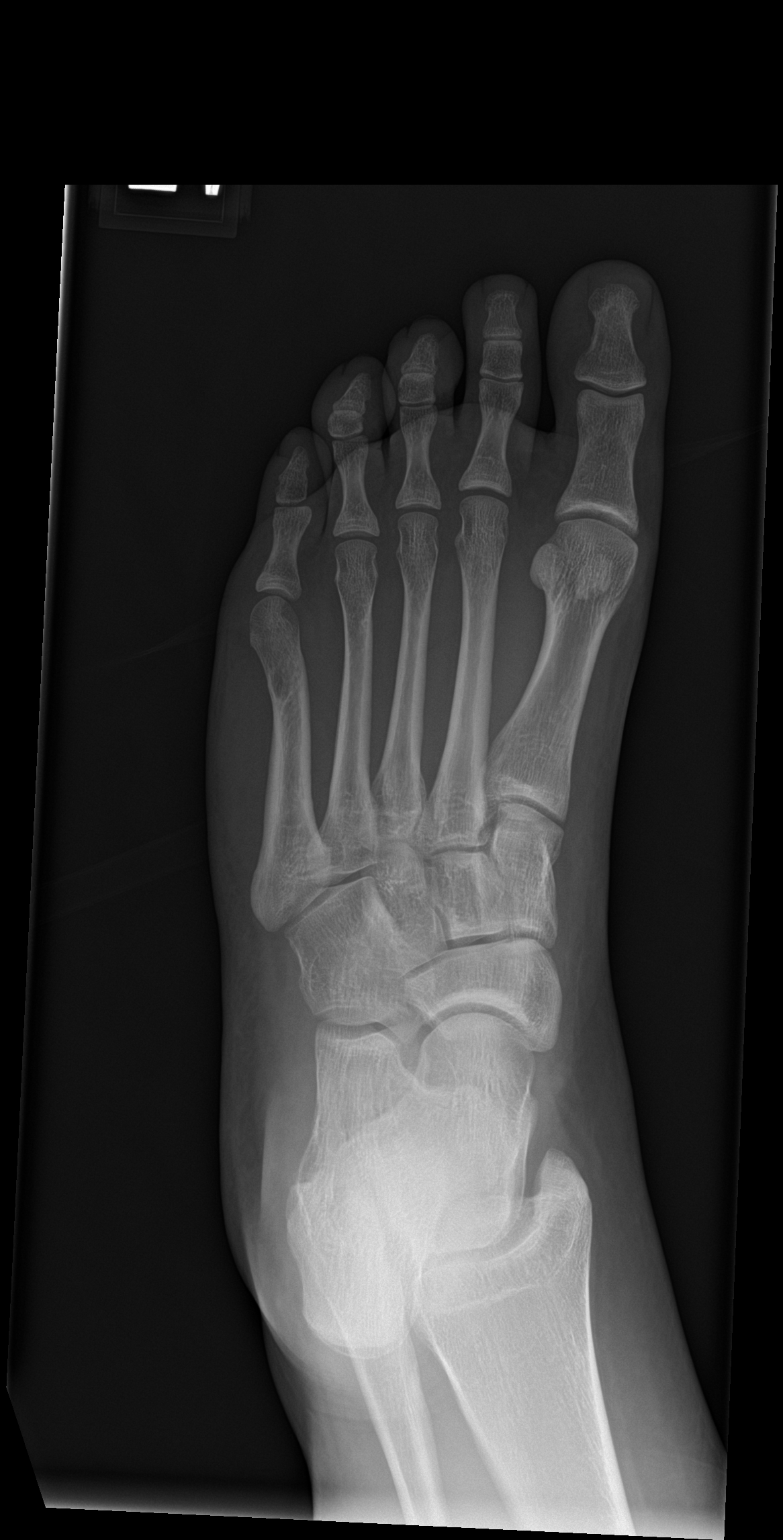

[foot lat]
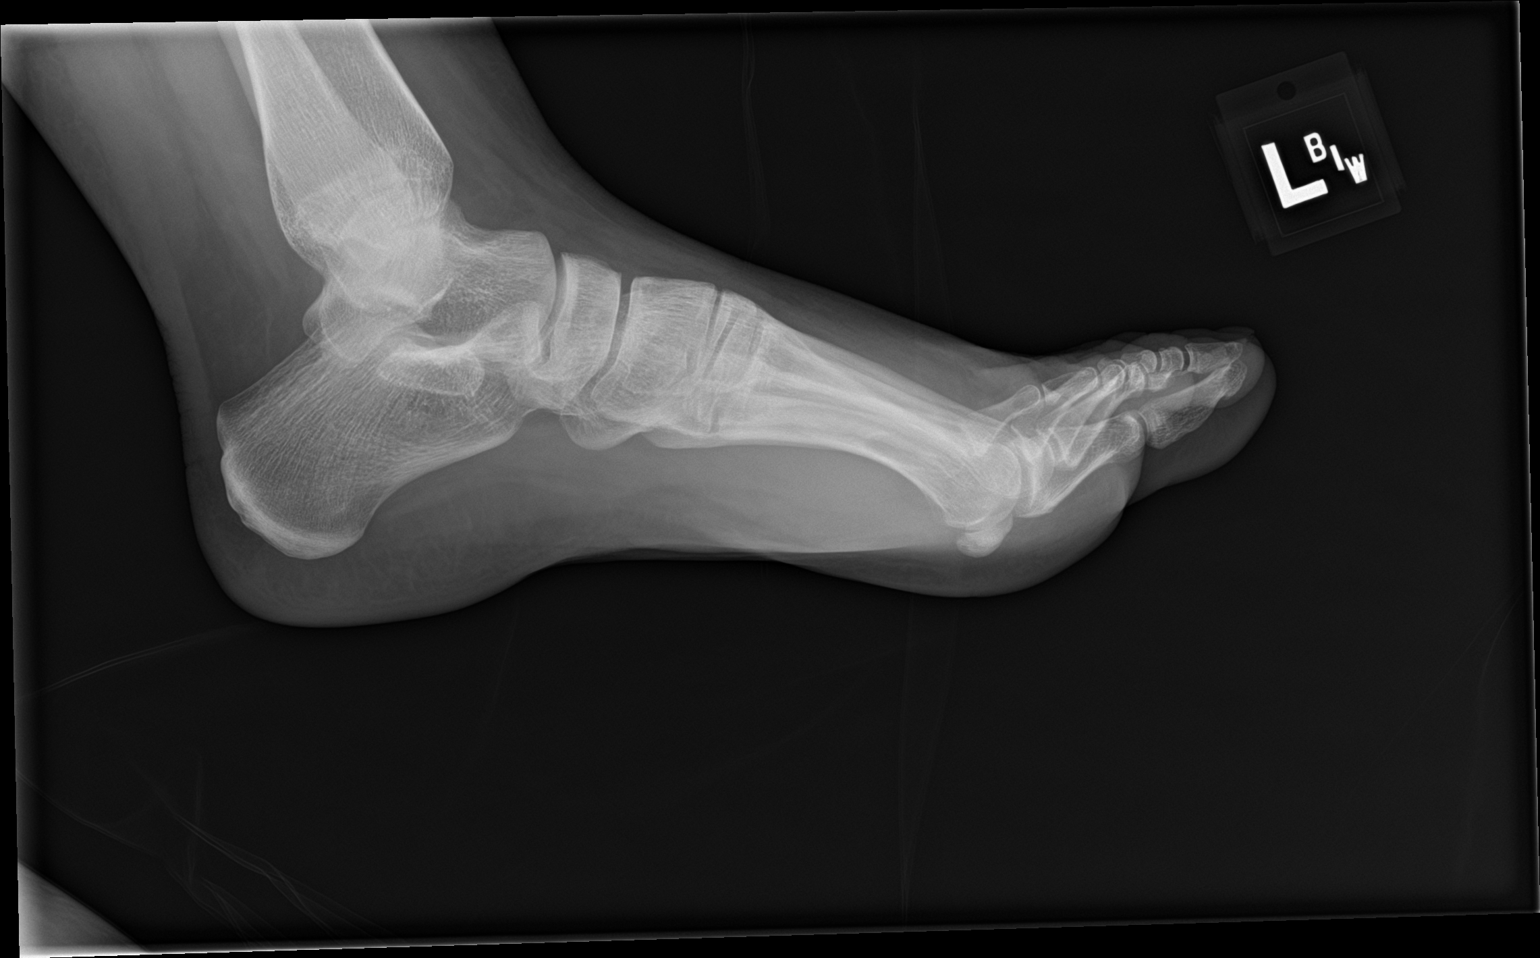

[3 of 3 positions shown; findings below may reference images not displayed]

FINDINGS: There is no evidence of fracture or dislocation. There is no
evidence of arthropathy or other focal bone abnormality. Soft
tissues are unremarkable.
IMPRESSION: No acute osseous injury of the left foot.

## 2015-04-21 MED ORDER — IBUPROFEN 600 MG PO TABS: 600.0000 mg | ORAL_TABLET | Freq: Four times a day (QID) | ORAL | Status: DC | PRN

## 2015-04-21 NOTE — ED Notes (Signed)
Pt c/o L foot pain after missing her step off a brick wall 2 weeks ago. Pain is increased with ambulation and weight bearing, decreased with elevation.

## 2015-04-21 NOTE — Discharge Instructions (Signed)

## 2015-04-21 NOTE — ED Notes (Signed)
Declined W/C at D/C and was escorted to lobby by RN. 

## 2015-04-21 NOTE — ED Provider Notes (Signed)
CSN: [id number]     Arrival date & time 04/21/15  1533 History  By signing my name below, I, Placido Sou, attest that this documentation has been prepared under the direction and in the presence of Roxy Horseman, PA-C. Electronically Signed: Placido Sou, ED Scribe. 04/21/2015. 5:11 PM.   Chief Complaint  Patient presents with  . Foot Pain   The history is provided by the patient. No language interpreter was used.   HPI Comments: Christy Bell is a 25 y.o. female who presents to the Emergency Department complaining of worsening, mild, gradual onset, left superior foot pain onset 2 weeks ago. Pt was walking down a brick staircase and accidentally rolled her left ankle resulting in her current pain. Her pain worsens with ambulation or when bearing weight and she describes her pain when ambulating as a pressure/pulling. Pt denies taking anything for pain management. 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 She denies falls or any other associated symptoms at this time.    Past Medical History  Diagnosis Date  . Depression   . Asthma   . Preeclampsia    Past Surgical History  Procedure Laterality Date  . Cesarean section N/A 09/07/2014    Procedure: CESAREAN SECTION;  Surgeon: Essie Hart, MD;  Location: [facility];  Service: Obstetrics;  Laterality: N/A;   History reviewed. No pertinent family history. Social History  Substance Use Topics  . Smoking status: Former Smoker    Quit date: 02/07/2014  . Smokeless tobacco: None  . Alcohol Use: 0.0 oz/week    0 Standard drinks or equivalent per week   OB History    Gravida Para Term Preterm AB TAB SAB Ectopic Multiple Living   0 1     Review of Systems  Musculoskeletal: Positive for arthralgias. Negative for joint swelling.  Skin: Negative for color change and wound.    Allergies  Review of patient's allergies indicates no known allergies.  Home Medications   Prior to Admission medications    Medication Sig Start Date End Date Taking? Authorizing Provider  famotidine (PEPCID) 20 MG tablet Take 1 tablet (20 mg total) by mouth 2 (two) times daily. Patient not taking: Reported on 12/14/2014 08/04/14   Everlene Farrier, PA-C  NIFEdipine (PROCARDIA-XL/ADALAT CC) 30 MG 24 hr tablet Take 1 tablet (30 mg total) by mouth daily. 09/12/14   Marlow Baars, MD  oxyCODONE-acetaminophen (PERCOCET/ROXICET) 5-325 MG per tablet Take 1-2 tablets by mouth every 6 (six) hours as needed for severe pain. Patient not taking: Reported on 12/14/2014 09/12/14   Marlow Baars, MD  PRESCRIPTION MEDICATION Take 1 tablet by mouth daily. Birth Control    Historical Provider, MD  sertraline (ZOLOFT) 50 MG tablet Take 1 tablet (50 mg total) by mouth daily. 09/12/14   Marlow Baars, MD   BP 122/82 mmHg  Pulse 109  Temp(Src) 99.3 F (37.4 C) (Oral)  Resp 20  SpO2 99%  LMP 04/14/2015 (Approximate) Physical Exam Constitutional: Pt appears well-developed and well-nourished. No distress.  HENT:  Head: Normocephalic and atraumatic.  Eyes: Conjunctivae are normal.  Neck: Normal range of motion.  Cardiovascular: Normal rate, regular rhythm and intact distal pulses.   Capillary refill < 3 sec  Pulmonary/Chest: Effort normal and breath sounds normal.  Musculoskeletal: Pt exhibits tenderness to palpation at the base of the 5th metatarsal. Pt exhibits no edema.  ROM: 5/5 of the left foot  Neurological: Pt  is alert. Coordination normal.  Sensation 5/5 of the left foot Strength 5/5 of the left foot  Skin: Skin is warm and dry. Pt is not diaphoretic.  No tenting of the skin  Psychiatric: Pt has a normal mood and affect.  Nursing note and vitals reviewed.  ED Course  Procedures  DIAGNOSTIC STUDIES: Oxygen Saturation is 99% on RA, normal by my interpretation.    COORDINATION OF CARE: 5:09 PM Discussed next steps with pt. She verbalized understanding and is agreeable with the plan.    Imaging Review Dg Foot Complete  Left  04/21/2015  CLINICAL DATA:  Left lateral foot pain. EXAM: LEFT FOOT - COMPLETE 3+ VIEW COMPARISON:  None. FINDINGS: There is no evidence of fracture or dislocation. There is no evidence of arthropathy or other focal bone abnormality. Soft tissues are unremarkable. IMPRESSION: No acute osseous injury of the left foot. Electronically Signed   By: Elige KoHetal  Patel   On: 04/21/2015 16:33   I have personally reviewed and evaluated these images as part of my medical decision-making.   MDM   Final diagnoses:  Foot sprain, left, initial encounter   Patient with left foot sprain. Plain films are negative. She does have some tenderness to palpation over the lateral aspect of the left foot, but there is no bony deformity or abnormality. Patient is able to ambulate. I will give the patient a postop shoe for splinting of the foot. Recommend orthopedic follow-up in 3-5 days. Continue rice and NSAIDs. Patient is stable and ready for discharge.  I personally performed the services described in this documentation, which was scribed in my presence. The recorded information has been reviewed and is accurate.      Roxy Horsemanobert Eliav Mechling, PA-C 04/21/15 1713  Linwood DibblesJon Knapp, MD 04/22/15 1225

## 2015-06-06 ENCOUNTER — Encounter (HOSPITAL_COMMUNITY): Payer: Self-pay | Admitting: Emergency Medicine

## 2015-06-06 ENCOUNTER — Emergency Department (HOSPITAL_COMMUNITY)
Admission: EM | Admit: 2015-06-06 | Discharge: 2015-06-06 | Disposition: A | Discharge status: Home / Self Care | Payer: Medicaid Other | Attending: Emergency Medicine | Admitting: Emergency Medicine

## 2015-06-06 DIAGNOSIS — F329 Major depressive disorder, single episode, unspecified: Secondary | ICD-10-CM | POA: Diagnosis not present

## 2015-06-06 DIAGNOSIS — Z79899 Other long term (current) drug therapy: Secondary | ICD-10-CM | POA: Insufficient documentation

## 2015-06-06 DIAGNOSIS — J45909 Unspecified asthma, uncomplicated: Secondary | ICD-10-CM | POA: Diagnosis not present

## 2015-06-06 DIAGNOSIS — Z87891 Personal history of nicotine dependence: Secondary | ICD-10-CM | POA: Insufficient documentation

## 2015-06-06 DIAGNOSIS — L0231 Cutaneous abscess of buttock: Secondary | ICD-10-CM | POA: Diagnosis not present

## 2015-06-06 MED ORDER — SULFAMETHOXAZOLE-TRIMETHOPRIM 800-160 MG PO TABS: 1.0000 | ORAL_TABLET | Freq: Two times a day (BID) | ORAL | Status: AC

## 2015-06-06 MED ORDER — SULFAMETHOXAZOLE-TRIMETHOPRIM 800-160 MG PO TABS
1.0000 | ORAL_TABLET | Freq: Once | ORAL | Status: AC
  Administered 2015-06-06: 1 via ORAL
  Filled 2015-06-06: qty 1

## 2015-06-06 MED ORDER — IBUPROFEN 600 MG PO TABS: 600.0000 mg | ORAL_TABLET | Freq: Three times a day (TID) | ORAL | Status: DC

## 2015-06-06 MED ORDER — IBUPROFEN 400 MG PO TABS
800.0000 mg | ORAL_TABLET | Freq: Once | ORAL | Status: AC
  Administered 2015-06-06: 800 mg via ORAL
  Filled 2015-06-06: qty 2

## 2015-06-06 NOTE — Discharge Instructions (Signed)
Take antibiotics as prescribed. Take ibuprofen as needed for pain. Return to the emergency room in two days for re-evaluation, or sooner if symptoms worsen.

## 2015-06-06 NOTE — ED Provider Notes (Signed)
CSN: [id number]     Arrival date & time 06/06/15  2007 History   First MD Initiated Contact with Patient 06/06/15 2052     Chief Complaint  Patient presents with  . Abscess     HPI  Christy Bell is an 25 y.o. female who presents to the ED for evaluation of an abscess on her upper left gluteal cleft. She states the first noticed a small bump 2-3 days ago that has gradually increased in size. She states the area is quite painful and is particularly sore with sitting and movement. She denies drainage or bleeding. She has not tried anything to alleviate her symptoms. She denies fever or chills. She states nothing like this has ever happened before.   Past Medical History  Diagnosis Date  . Depression   . Asthma   . Preeclampsia    Past Surgical History  Procedure Laterality Date  . Cesarean section N/A 09/07/2014    Procedure: CESAREAN SECTION;  Surgeon: Essie HartWalda Pinn, MD;  Location: [facility];  Service: Obstetrics;  Laterality: N/A;   No family history on file. Social History  Substance Use Topics  . Smoking status: Former Smoker    Quit date: 02/07/2014  . Smokeless tobacco: None  . Alcohol Use: 0.0 oz/week    0 Standard drinks or equivalent per week   OB History    Gravida Para Term Preterm AB TAB SAB Ectopic Multiple Living   1 1  1      0 1     Review of Systems  All other systems reviewed and are negative.     Allergies  Review of patient's allergies indicates no known allergies.  Home Medications   Prior to Admission medications   Medication Sig Start Date End Date Taking? Authorizing Provider  famotidine (PEPCID) 20 MG tablet Take 1 tablet (20 mg total) by mouth 2 (two) times daily. Patient not taking: Reported on 12/14/2014 08/04/14   Everlene FarrierWilliam Dansie, PA-C  ibuprofen (ADVIL,MOTRIN) 600 MG tablet Take 1 tablet (600 mg total) by mouth every 6 (six) hours as needed. 04/21/15   Roxy Horsemanobert Browning, PA-C  NIFEdipine (PROCARDIA-XL/ADALAT CC) 30 MG 24 hr tablet Take 1 tablet  (30 mg total) by mouth daily. 09/12/14   Marlow Baarsyanna Clark, MD  oxyCODONE-acetaminophen (PERCOCET/ROXICET) 5-325 MG per tablet Take 1-2 tablets by mouth every 6 (six) hours as needed for severe pain. Patient not taking: Reported on 12/14/2014 09/12/14   Marlow Baarsyanna Clark, MD  PRESCRIPTION MEDICATION Take 1 tablet by mouth daily. Birth Control    Historical Provider, MD  sertraline (ZOLOFT) 50 MG tablet Take 1 tablet (50 mg total) by mouth daily. 09/12/14   Marlow Baarsyanna Clark, MD   BP 133/76 mmHg  Pulse 107  Temp(Src) 98.7 F (37.1 C) (Oral)  Resp 20  Ht 5\' 3"  (1.6 m)  Wt 84.482 kg  BMI 33.00 kg/m2  SpO2 100% Physical Exam  Constitutional: She is oriented to person, place, and time. No distress.  HENT:  Head: Atraumatic.  Right Ear: External ear normal.  Left Ear: External ear normal.  Nose: Nose normal.  Eyes: Conjunctivae are normal. No scleral icterus.  Neck: Normal range of motion. Neck supple.  Cardiovascular: Normal rate and regular rhythm.   Pulmonary/Chest: Effort normal. No respiratory distress. She exhibits no tenderness.  Abdominal: Soft. She exhibits no distension. There is no tenderness.  Neurological: She is alert and oriented to person, place, and time.  Skin: Skin is warm and dry. She is not diaphoretic.  Small dime  sized area of fluctuance and tenderness located at upper left gluteal cleft. No drainage or erythema.   Psychiatric: She has a normal mood and affect. Her behavior is normal.  Nursing note and vitals reviewed.   ED Course  Procedures (including critical care time) Labs Review Labs Reviewed - No data to display  Imaging Review No results found. I have personally reviewed and evaluated these images and lab results as part of my medical decision-making.   EKG Interpretation None      MDM   Final diagnoses:  Abscess of left buttock    Pt presenting with small, dime-sized area consistent with small abscess. No overlying erythema. No drainage. Given small size and  no visible purulence i discussed tx options of I&D in the ED versus trial of PO abx at home. She would like to hold off on drainage today and try abx at home. I think this is reasonable. First dose bactrim and ibuprofen given in the ED. Instructed to return to ER in 2 days for re-check, or sooner if symptoms worsen. She is in agreement and understanding of plan.     Carlene Coria, PA-C 06/06/15 2140  Loren Racer, MD 06/07/15 [phone]

## 2015-06-06 NOTE — ED Notes (Signed)
Pt has a red small knot in between upper buttocks pt states has been there for 3 days. Pt also reports boil in right groin.

## 2015-06-08 ENCOUNTER — Emergency Department (HOSPITAL_COMMUNITY)
Admission: EM | Admit: 2015-06-08 | Discharge: 2015-06-08 | Disposition: A | Discharge status: Home / Self Care | Payer: Medicaid Other | Attending: Emergency Medicine | Admitting: Emergency Medicine

## 2015-06-08 ENCOUNTER — Encounter (HOSPITAL_COMMUNITY): Payer: Self-pay | Admitting: Emergency Medicine

## 2015-06-08 DIAGNOSIS — Z79899 Other long term (current) drug therapy: Secondary | ICD-10-CM | POA: Diagnosis not present

## 2015-06-08 DIAGNOSIS — L0231 Cutaneous abscess of buttock: Secondary | ICD-10-CM | POA: Diagnosis present

## 2015-06-08 DIAGNOSIS — Z87891 Personal history of nicotine dependence: Secondary | ICD-10-CM | POA: Diagnosis not present

## 2015-06-08 DIAGNOSIS — F329 Major depressive disorder, single episode, unspecified: Secondary | ICD-10-CM | POA: Diagnosis not present

## 2015-06-08 DIAGNOSIS — Z791 Long term (current) use of non-steroidal anti-inflammatories (NSAID): Secondary | ICD-10-CM | POA: Diagnosis not present

## 2015-06-08 DIAGNOSIS — J45909 Unspecified asthma, uncomplicated: Secondary | ICD-10-CM | POA: Insufficient documentation

## 2015-06-08 MED ORDER — HYDROCODONE-ACETAMINOPHEN 5-325 MG PO TABS: 1.0000 | ORAL_TABLET | Freq: Four times a day (QID) | ORAL | Status: DC | PRN

## 2015-06-08 MED ORDER — ACETAMINOPHEN 500 MG PO TABS
1000.0000 mg | ORAL_TABLET | Freq: Once | ORAL | Status: AC
  Administered 2015-06-08: 1000 mg via ORAL
  Filled 2015-06-08: qty 2

## 2015-06-08 MED ORDER — LIDOCAINE-EPINEPHRINE (PF) 2 %-1:200000 IJ SOLN
10.0000 mL | Freq: Once | INTRAMUSCULAR | Status: AC
  Administered 2015-06-08: 10 mL
  Filled 2015-06-08: qty 20

## 2015-06-08 NOTE — ED Provider Notes (Signed)
Addendum for 06/06/15 note: all documentation should note RIGHT gluteal cleft abscess, not left.    Carlene CoriaSerena Y Davisha Linthicum, PA-C 06/08/15 1912  Loren Raceravid Yelverton, MD 06/15/15 [phone]

## 2015-06-08 NOTE — ED Notes (Signed)
Pt states "I have a boil right at the top of my buttcrack and i was here two days ago and given antibiotics and ibuprofen and it didn't help, they told me to come back if it got worse and it feels worse".

## 2015-06-08 NOTE — ED Provider Notes (Signed)
History  By signing my name below, I, Christy Bell, attest that this documentation has been prepared under the direction and in the presence of Christy Bell, [place]. Electronically Signed: Karle Bell, ED Scribe. 06/08/2015. 8:09 PM.  Chief Complaint  Patient presents with  . Cyst   The history is provided by the patient and medical records. No language interpreter was used.    HPI Comments:  Christy Bell is a 25 y.o. female who presents to the Emergency Department complaining of an abscess to the upper right gluteal cleft that she noticed about 5 days ago. She states she was seen here two days ago and was prescribed antibiotics but states it is not getting any better. She states she has been taking the antibiotics as directed. Touching the area increases the pain. She denies alleviating factors. She denies fever, chills, nausea, vomiting, drainage, red streaking. She does not currently have a PCP.  Past Medical History  Diagnosis Date  . Depression   . Asthma   . Preeclampsia    Past Surgical History  Procedure Laterality Date  . Cesarean section N/A 09/07/2014    Procedure: CESAREAN SECTION;  Surgeon: Essie Hart, MD;  Location: [facility];  Service: Obstetrics;  Laterality: N/A;   No family history on file. Social History  Substance Use Topics  . Smoking status: Former Smoker    Quit date: 02/07/2014  . Smokeless tobacco: None  . Alcohol Use: 0.0 oz/week    0 Standard drinks or equivalent per week   OB History    Gravida Para Term Preterm AB TAB SAB Ectopic Multiple Living   0 1     Review of Systems A complete 10 system review of systems was obtained and all systems are negative except as noted in the HPI and PMH.   Allergies  Review of patient's allergies indicates no known allergies.  Home Medications   Prior to Admission medications   Medication Sig Start Date End Date Taking? Authorizing Provider  famotidine (PEPCID) 20 MG tablet Take 1 tablet  (20 mg total) by mouth 2 (two) times daily. Patient not taking: Reported on 12/14/2014 08/04/14   Everlene Farrier, PA-C  ibuprofen (ADVIL,MOTRIN) 600 MG tablet Take 1 tablet (600 mg total) by mouth every 6 (six) hours as needed. 04/21/15   Roxy Horseman, PA-C  ibuprofen (ADVIL,MOTRIN) 600 MG tablet Take 1 tablet (600 mg total) by mouth 3 (three) times daily. 06/06/15   Ace Gins Roselinda Bahena, PA-C  NIFEdipine (PROCARDIA-XL/ADALAT CC) 30 MG 24 hr tablet Take 1 tablet (30 mg total) by mouth daily. 09/12/14   Marlow Baars, MD  oxyCODONE-acetaminophen (PERCOCET/ROXICET) 5-325 MG per tablet Take 1-2 tablets by mouth every 6 (six) hours as needed for severe pain. Patient not taking: Reported on 12/14/2014 09/12/14   Marlow Baars, MD  PRESCRIPTION MEDICATION Take 1 tablet by mouth daily. Birth Control    Historical Provider, MD  sertraline (ZOLOFT) 50 MG tablet Take 1 tablet (50 mg total) by mouth daily. 09/12/14   Marlow Baars, MD  sulfamethoxazole-trimethoprim (BACTRIM DS,SEPTRA DS) 800-160 MG tablet Take 1 tablet by mouth 2 (two) times daily. 06/06/15 06/13/15  Carlene Coria, PA-C   Triage Vitals: BP 107/67 mmHg  Pulse 116  Temp(Src) 99.3 F (37.4 C) (Oral)  Resp 20  SpO2 100%  LMP 06/06/2015 Physical Exam  Constitutional: She is oriented to person, place, and time. She appears well-developed and well-nourished.  HENT:  Head: Normocephalic and atraumatic.  Eyes: EOM are normal.  Neck: Normal range of motion.  Cardiovascular: Normal rate.   Pulmonary/Chest: Effort normal.  Musculoskeletal: Normal range of motion.  Neurological: She is alert and oriented to person, place, and time.  Skin: Skin is warm and dry.  2cm area of fluctuance on right buttock near top of gluteal cleft, minimal overlying erythema. TTP.   Psychiatric: She has a normal mood and affect. Her behavior is normal.  Nursing note and vitals reviewed.   ED Course  Procedures (including critical care time) DIAGNOSTIC STUDIES: Oxygen Saturation is  100% on RA, normal by my interpretation.   COORDINATION OF CARE: 7:01 PM- Will I & D abscess. Pt verbalizes understanding and agrees to plan.  INCISION AND DRAINAGE PROCEDURE NOTE: Patient identification was confirmed and verbal consent was obtained. This procedure was performed by Noelle PennerSerena Shaunda Tipping, PA-C at 7:15 PM. Site: upper right gluteal cleft Sterile procedures observed Needle size: 25 G Anesthetic used (type and amt): Lidocaine 2% with Epinephrine (3 mLs) Blade size: 11 Drainage: copious Complexity: simple Packing used: n/a Site anesthetized, incision made over site, wound drained and explored loculations, rinsed with copious amounts of normal saline, covered with dry, sterile dressing. Pt tolerated procedure well without complications. Instructions for care discussed verbally and pt provided with additional written instructions for homecare and f/u.  Medications  lidocaine-EPINEPHrine (XYLOCAINE W/EPI) 2 %-1:200000 (PF) injection 10 mL (10 mLs Infiltration Given by Other 06/08/15 1915)  acetaminophen (TYLENOL) tablet 1,000 mg (1,000 mg Oral Given 06/08/15 1915)    Labs Review Labs Reviewed - No data to display  Imaging Review No results found. I have personally reviewed and evaluated these images and lab results as part of my medical decision-making.   EKG Interpretation None      MDM   Final diagnoses:  Abscess of right buttock    Pt tolerated I&D well. Instructed to finish course of Bactrim. SHe has naproxen at home. Short course of norco given as needed for severe pain. Discussed wound care instructions. ER return precautions given.   I personally performed the services described in this documentation, which was scribed in my presence. The recorded information has been reviewed and is accurate.    Carlene CoriaSerena Y Thayer Inabinet, PA-C 06/09/15 1337  Doug SouSam Jacubowitz, MD 06/11/15 [phone]

## 2015-06-08 NOTE — Discharge Instructions (Signed)
Incision and Drainage Incision and drainage is a procedure in which a sac-like structure (cystic structure) is opened and drained. The area to be drained usually contains material such as pus, fluid, or blood.  LET YOUR CAREGIVER KNOW ABOUT:   Allergies to medicine.  Medicines taken, including vitamins, herbs, eyedrops, over-the-counter medicines, and creams.  Use of steroids (by mouth or creams).  Previous problems with anesthetics or numbing medicines.  History of bleeding problems or blood clots.  Previous surgery.  Other health problems, including diabetes and kidney problems.  Possibility of pregnancy, if this applies. RISKS AND COMPLICATIONS  Pain.  Bleeding.  Scarring.  Infection. BEFORE THE PROCEDURE  You may need to have an ultrasound or other imaging tests to see how large or deep your cystic structure is. Blood tests may also be used to determine if you have an infection or how severe the infection is. You may need to have a tetanus shot. PROCEDURE  The affected area is cleaned with a cleaning fluid. The cyst area will then be numbed with a medicine (local anesthetic). A small incision will be made in the cystic structure. A syringe or catheter may be used to drain the contents of the cystic structure, or the contents may be squeezed out. The area will then be flushed with a cleansing solution. After cleansing the area, it is often gently packed with a gauze or another wound dressing. Once it is packed, it will be covered with gauze and tape or some other type of wound dressing. AFTER THE PROCEDURE   Often, you will be allowed to go home right after the procedure.  You may be given antibiotic medicine to prevent or heal an infection.  If the area was packed with gauze or some other wound dressing, you will likely need to come back in 1 to 2 days to get it removed.  The area should heal in about 14 days.   This information is not intended to replace advice given  to you by your health care provider. Make sure you discuss any questions you have with your health care provider.   Document Released: 07/18/2000 Document Revised: 07/24/2011 Document Reviewed: 03/19/2011 Elsevier Interactive Patient Education 2016 [employer]  

## 2015-10-17 ENCOUNTER — Encounter (HOSPITAL_COMMUNITY): Payer: Self-pay | Admitting: Family Medicine

## 2015-10-17 ENCOUNTER — Ambulatory Visit (HOSPITAL_COMMUNITY)
Admission: EM | Admit: 2015-10-17 | Discharge: 2015-10-17 | Disposition: A | Discharge status: Home / Self Care | Payer: Medicaid Other | Attending: Family Medicine | Admitting: Family Medicine

## 2015-10-17 DIAGNOSIS — R300 Dysuria: Secondary | ICD-10-CM | POA: Insufficient documentation

## 2015-10-17 DIAGNOSIS — J45909 Unspecified asthma, uncomplicated: Secondary | ICD-10-CM | POA: Insufficient documentation

## 2015-10-17 DIAGNOSIS — J069 Acute upper respiratory infection, unspecified: Secondary | ICD-10-CM | POA: Diagnosis not present

## 2015-10-17 DIAGNOSIS — Z87891 Personal history of nicotine dependence: Secondary | ICD-10-CM | POA: Diagnosis not present

## 2015-10-17 DIAGNOSIS — F329 Major depressive disorder, single episode, unspecified: Secondary | ICD-10-CM | POA: Diagnosis not present

## 2015-10-17 DIAGNOSIS — R05 Cough: Secondary | ICD-10-CM | POA: Diagnosis present

## 2015-10-17 LAB — POCT URINALYSIS DIP (DEVICE)
BILIRUBIN URINE: NEGATIVE
GLUCOSE, UA: NEGATIVE mg/dL
Hgb urine dipstick: NEGATIVE
Ketones, ur: NEGATIVE mg/dL
LEUKOCYTES UA: NEGATIVE
NITRITE: NEGATIVE
Protein, ur: NEGATIVE mg/dL
SPECIFIC GRAVITY, URINE: 1.015 (ref 1.005–1.030)
UROBILINOGEN UA: 0.2 mg/dL (ref 0.0–1.0)
pH: 7 (ref 5.0–8.0)

## 2015-10-17 LAB — POCT PREGNANCY, URINE: Preg Test, Ur: NEGATIVE

## 2015-10-17 MED ORDER — IPRATROPIUM BROMIDE 0.06 % NA SOLN: 2.0000 | Freq: Four times a day (QID) | NASAL | 1 refills | Status: DC

## 2015-10-17 MED ORDER — CEPHALEXIN 500 MG PO CAPS: 500.0000 mg | ORAL_CAPSULE | Freq: Four times a day (QID) | ORAL | 0 refills | Status: DC

## 2015-10-17 NOTE — ED Provider Notes (Signed)
[facility]    CSN: [id number] Arrival date & time: 10/17/15  1439  First Provider Contact:  First MD Initiated Contact with Patient 10/17/15 1600        History   Chief Complaint Chief Complaint  Patient presents with  . Dysuria  . Cough    HPI Christy Bell is a [age] y.o. female.   The history is provided by the patient.  Dysuria  Pain quality:  Burning Pain severity:  Mild Onset quality:  Gradual Duration:  1 week Progression:  Unchanged Chronicity:  New Recent urinary tract infections: no   Relieved by:  None tried Worsened by:  Nothing Ineffective treatments:  None tried Urinary symptoms: frequent urination   Associated symptoms: no abdominal pain, no fever, no flank pain, no nausea, no vaginal discharge and no vomiting   Risk factors: no recurrent urinary tract infections   Cough  Associated symptoms: no fever and no shortness of breath     Past Medical History:  Diagnosis Date  . Asthma   . Depression   . Preeclampsia     Patient Active Problem List   Diagnosis Date Noted  . Cesarean delivery delivered 09/07/2014  . Hypertension complicating pregnancy 09/05/2014  . Decreased fetal movement   . Fetal heart rate decelerations affecting management of mother   . [redacted] weeks gestation of pregnancy   . Preeclampsia     Past Surgical History:  Procedure Laterality Date  . CESAREAN SECTION N/A 09/07/2014   Procedure: CESAREAN SECTION;  Surgeon: Essie HartWalda Pinn, MD;  Location: [facility];  Service: Obstetrics;  Laterality: N/A;    OB History    Gravida Para Term Preterm AB Living   1 1   1   1    SAB TAB Ectopic Multiple Live Births         0 1       Home Medications    Prior to Admission medications   Medication Sig Start Date End Date Taking? Authorizing Provider  PRESCRIPTION MEDICATION Take 1 tablet by mouth daily. Birth Control    Historical Provider, MD  sertraline (ZOLOFT) 50 MG tablet Take 1 tablet (50 mg total) by mouth daily.  09/12/14   Marlow Baarsyanna Clark, MD    Family History History reviewed. No pertinent family history.  Social History Social History  Substance Use Topics  . Smoking status: Former Smoker    Quit date: 02/07/2014  . Smokeless tobacco: Never Used  . Alcohol use 0.0 oz/week     Allergies   Review of patient's allergies indicates no known allergies.   Review of Systems Review of Systems  Constitutional: Negative.  Negative for fever.  HENT: Positive for congestion and postnasal drip.   Respiratory: Positive for cough. Negative for shortness of breath.   Gastrointestinal: Negative.  Negative for abdominal pain, nausea and vomiting.  Genitourinary: Positive for dysuria, frequency and urgency. Negative for flank pain, vaginal bleeding and vaginal discharge.  Musculoskeletal: Negative.   All other systems reviewed and are negative.    Physical Exam Triage Vital Signs ED Triage Vitals [10/17/15 1537]  Enc Vitals Group     BP 138/80     Pulse Rate 96     Resp 16     Temp 99.3 F (37.4 C)     Temp src      SpO2 100 %     Weight      Height      Head Circumference      Peak Flow  Pain Score      Pain Loc      Pain Edu?      Excl. in GC?    No data found.   Updated Vital Signs BP 138/80 (BP Location: Left Arm)   Pulse 96   Temp 99.3 F (37.4 C)   Resp 16   LMP 10/06/2015   SpO2 100%   Visual Acuity Right Eye Distance:   Left Eye Distance:   Bilateral Distance:    Right Eye Near:   Left Eye Near:    Bilateral Near:     Physical Exam   UC Treatments / Results  Labs (all labs ordered are listed, but only abnormal results are displayed) Labs Reviewed  POCT URINALYSIS DIP (DEVICE)    EKG  EKG Interpretation None       Radiology No results found.  Procedures Procedures (including critical care time)  Medications Ordered in UC Medications - No data to display   Initial Impression / Assessment and Plan / UC Course  I have reviewed the triage  vital signs and the nursing notes.  Pertinent labs & imaging results that were available during my care of the patient were reviewed by me and considered in my medical decision making (see chart for details).  Clinical Course      Final Clinical Impressions(s) / UC Diagnoses   Final diagnoses:  None    New Prescriptions New Prescriptions   No medications on file     Linna Hoff, MD 10/17/15 2125

## 2015-10-17 NOTE — ED Triage Notes (Signed)
Pt here for cough that has been going on since July of and on. sts also she has been having burning with peeing.

## 2015-10-19 LAB — URINE CULTURE

## 2015-12-13 ENCOUNTER — Encounter (HOSPITAL_COMMUNITY): Payer: Self-pay | Admitting: Emergency Medicine

## 2015-12-13 ENCOUNTER — Emergency Department (HOSPITAL_COMMUNITY): Payer: Medicaid Other

## 2015-12-13 ENCOUNTER — Emergency Department (HOSPITAL_COMMUNITY)
Admission: EM | Admit: 2015-12-13 | Discharge: 2015-12-13 | Disposition: A | Discharge status: Home / Self Care | Payer: Medicaid Other | Attending: Emergency Medicine | Admitting: Emergency Medicine

## 2015-12-13 DIAGNOSIS — R05 Cough: Secondary | ICD-10-CM | POA: Diagnosis present

## 2015-12-13 DIAGNOSIS — Z87891 Personal history of nicotine dependence: Secondary | ICD-10-CM | POA: Diagnosis not present

## 2015-12-13 DIAGNOSIS — J45909 Unspecified asthma, uncomplicated: Secondary | ICD-10-CM | POA: Insufficient documentation

## 2015-12-13 DIAGNOSIS — J069 Acute upper respiratory infection, unspecified: Secondary | ICD-10-CM | POA: Diagnosis not present

## 2015-12-13 IMAGING — DX DG CHEST 2V
2 series · 2 of 2 positions shown · non-contrast
Comparison: None.

CLINICAL DATA: Intermittent cough starting Kaki

EXAM:
CHEST  2 VIEW

[chest pa]
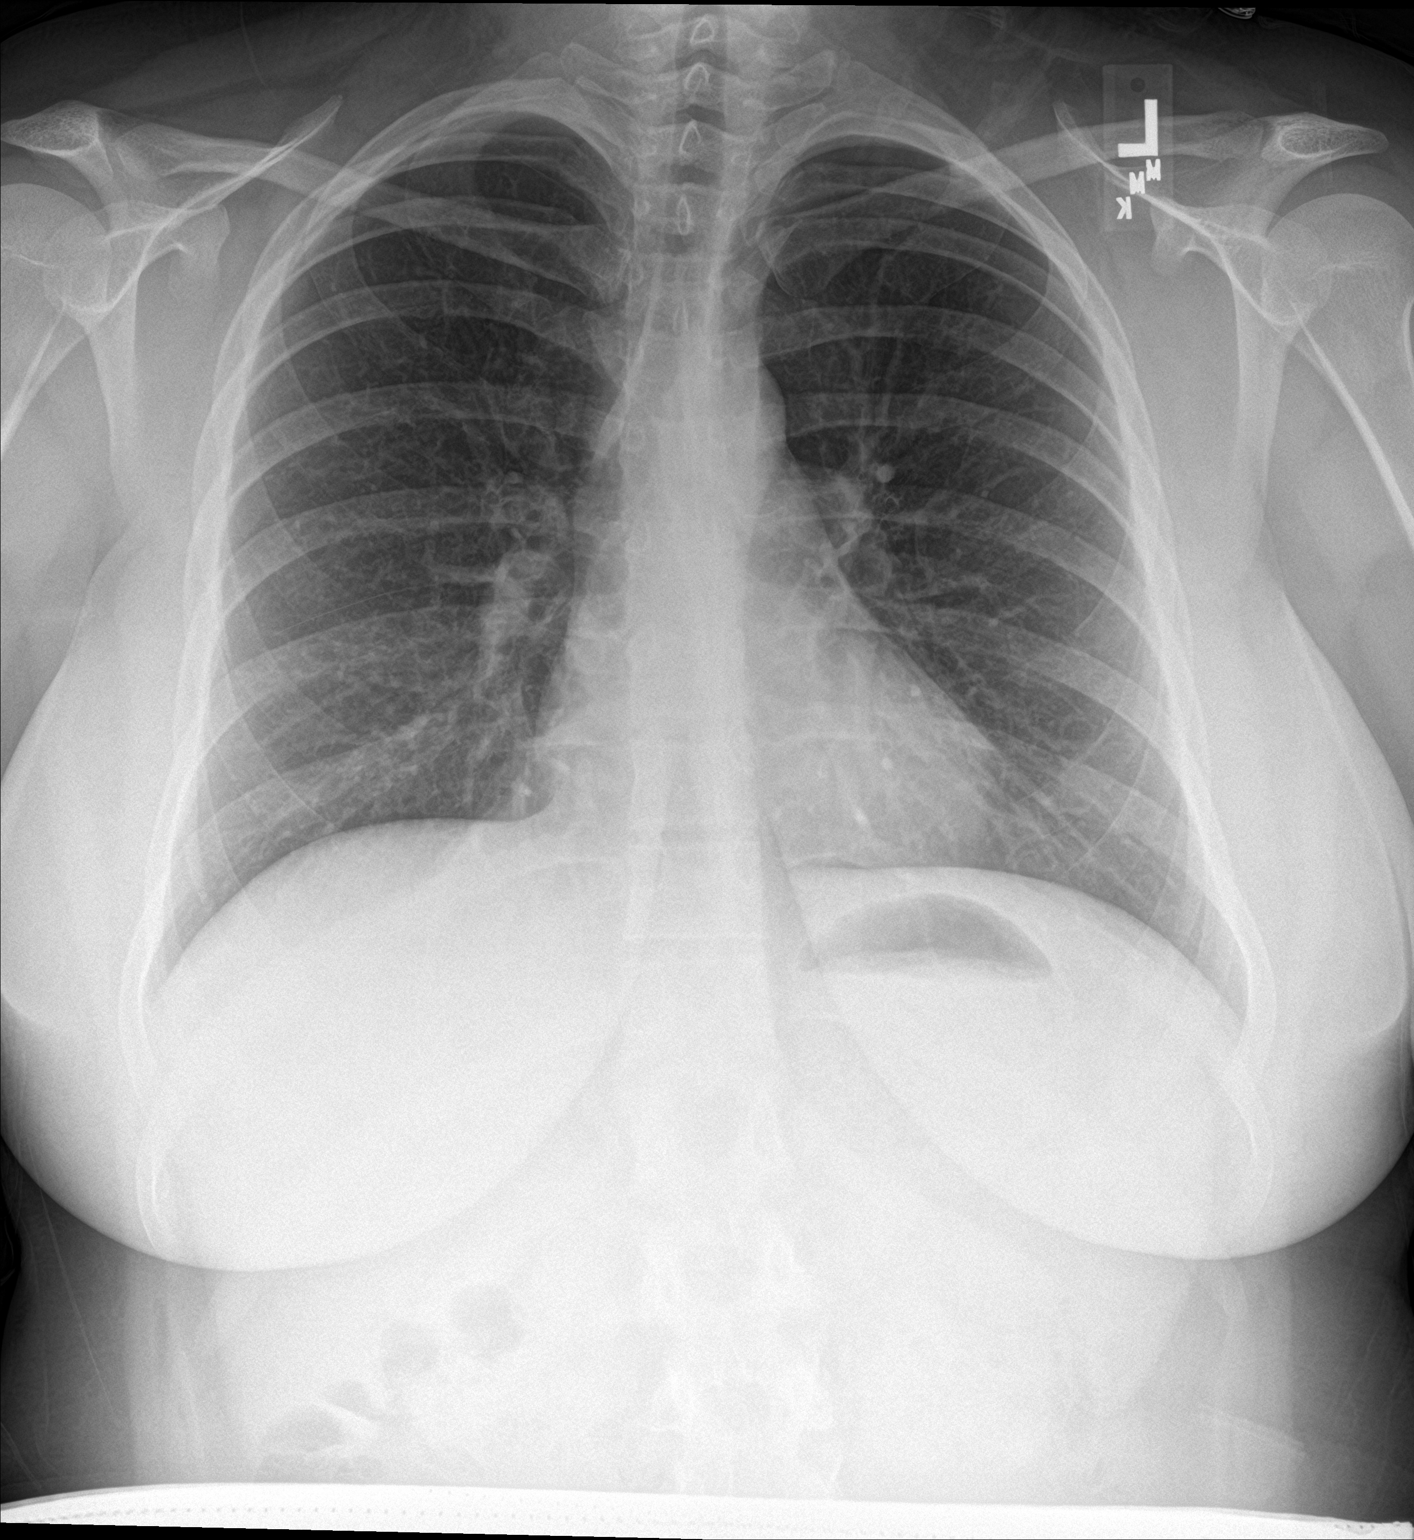

[chest lat]
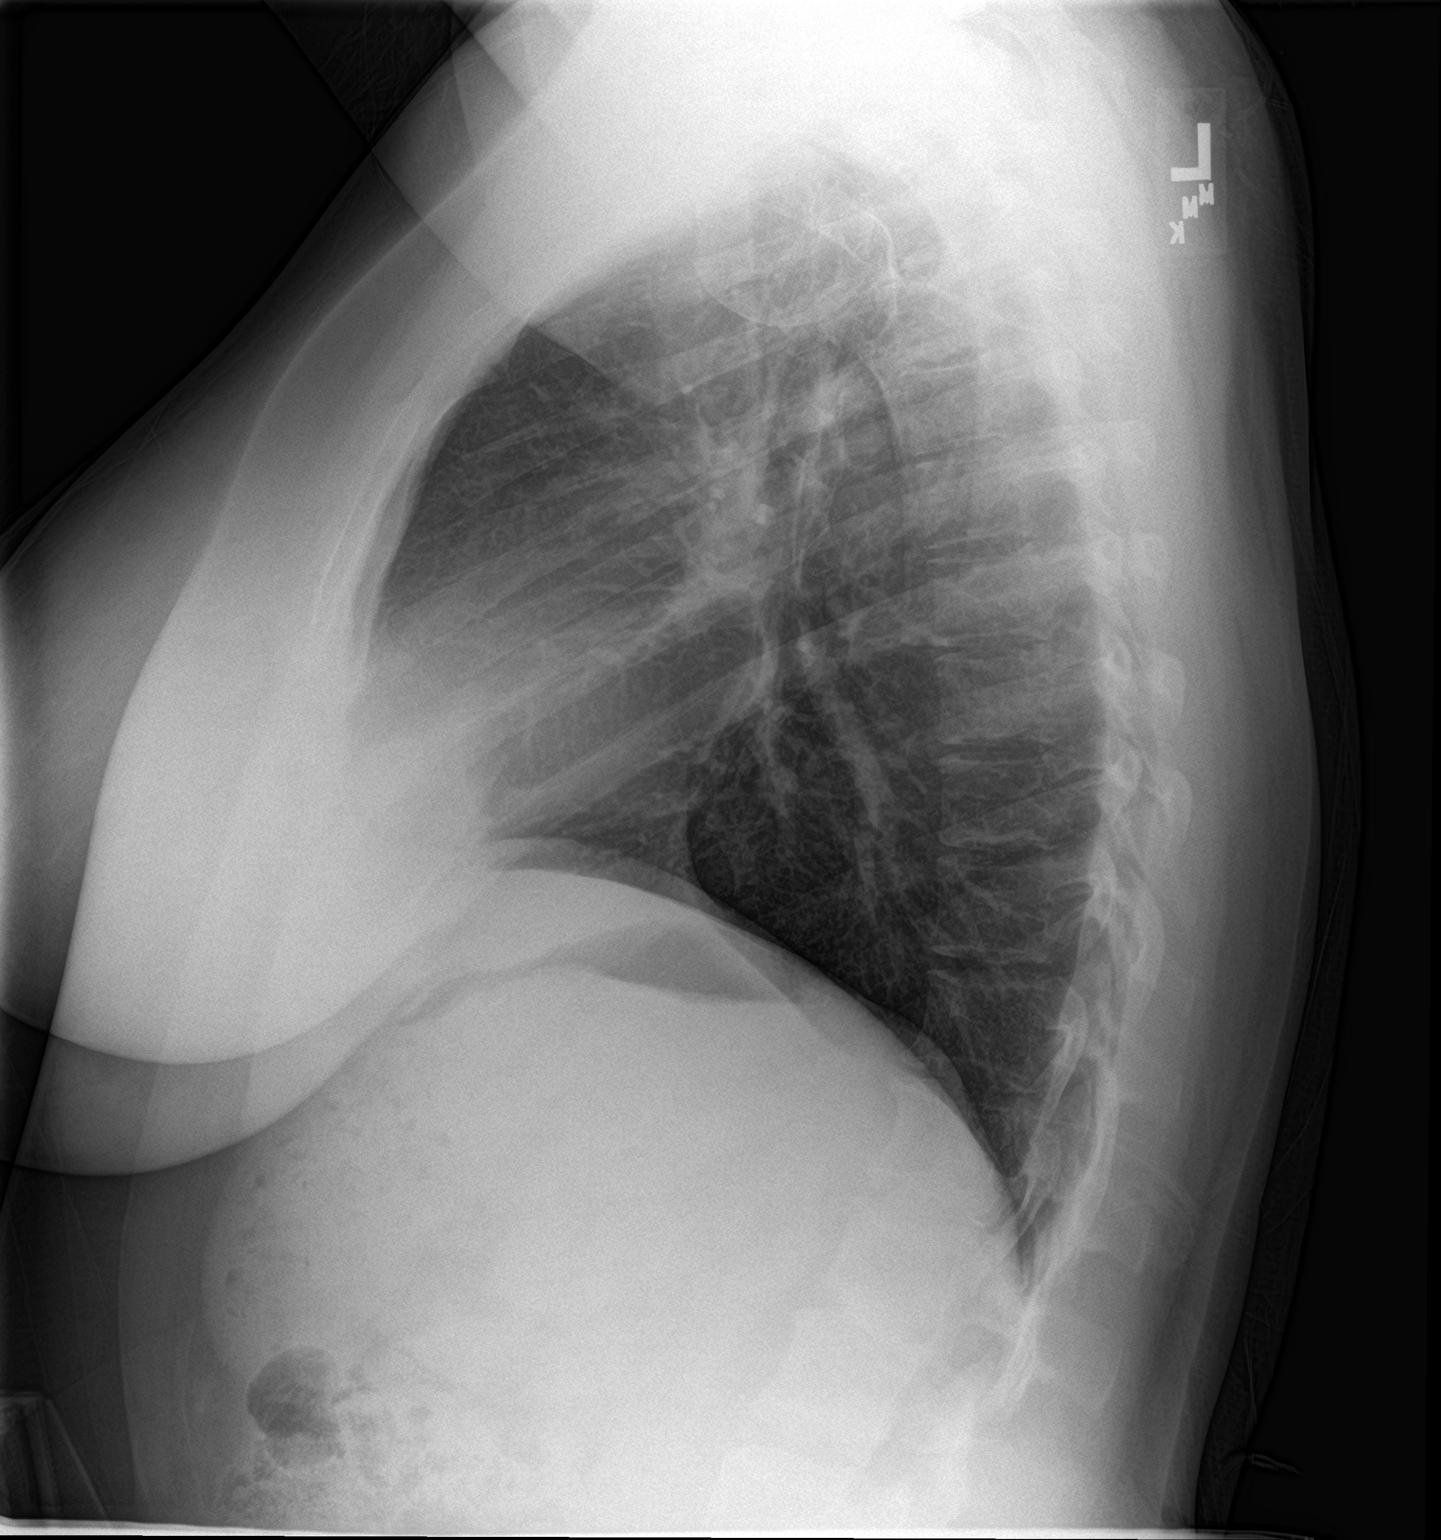

[2 of 2 positions shown; findings below may reference images not displayed]

FINDINGS: The heart size and mediastinal contours are within normal limits.
Both lungs are clear. The visualized skeletal structures are
unremarkable.
IMPRESSION: No active cardiopulmonary disease.

## 2015-12-13 MED ORDER — AZITHROMYCIN 250 MG PO TABS: 250.0000 mg | ORAL_TABLET | Freq: Every day | ORAL | 0 refills | Status: DC

## 2015-12-13 NOTE — ED Triage Notes (Signed)
Pt states "coughing really bad for the past few days, felt stuffed up, felt warm". Pt states she went to her PCP for URI. Pt c/o "really bad cough since the end of July".

## 2015-12-13 NOTE — ED Notes (Signed)
PT C/O COUGH, NASAL CONGESTION AND SORE THROAT X 3 DAYS.

## 2015-12-13 NOTE — ED Notes (Signed)
Declined W/C at D/C and was escorted to lobby by RN. 

## 2015-12-13 NOTE — ED Provider Notes (Signed)
[facility]-EMERGENCY DEPT Provider Note   CSN: [id number] Arrival date & time: 12/13/15  1421  By signing my name below, I, Majel HomerPeyton Lee, attest that this documentation has been prepared under the direction and in the presence of non-physician practitioner, Roxy Horsemanobert Aldahir Litaker, PA-C. Electronically Signed: Majel HomerPeyton Lee, Scribe. 12/13/2015. 2:47 PM.  History   Chief Complaint Chief Complaint  Patient presents with  . Cough   The history is provided by the patient. No language interpreter was used.   HPI Comments: Christy Bell is a 25 y.o. female who presents to the Emergency Department with a chief complaint of persistent, cough that began several months ago.  She states that the symptoms have been coming and going. Pt reports associated subjective fever, sore throat that is exacerbated with sneezing. She states she visited her PCP a few months ago in which she was diagnosed with a URI and prescribed a nasal spray "that made her symptoms worse."   Past Medical History:  Diagnosis Date  . Asthma   . Depression   . Preeclampsia     Patient Active Problem List   Diagnosis Date Noted  . Cesarean delivery delivered 09/07/2014  . Hypertension complicating pregnancy 09/05/2014  . Decreased fetal movement   . Fetal heart rate decelerations affecting management of mother   . [redacted] weeks gestation of pregnancy   . Preeclampsia     Past Surgical History:  Procedure Laterality Date  . CESAREAN SECTION N/A 09/07/2014   Procedure: CESAREAN SECTION;  Surgeon: Essie HartWalda Pinn, MD;  Location: [facility];  Service: Obstetrics;  Laterality: N/A;    OB History    Gravida Para Term Preterm AB Living   1 1   1   1    SAB TAB Ectopic Multiple Live Births         0 1       Home Medications    Prior to Admission medications   Medication Sig Start Date End Date Taking? Authorizing Provider  cephALEXin (KEFLEX) 500 MG capsule Take 1 capsule (500 mg total) by mouth 4 (four) times daily. Take all of medicine and  drink lots of fluids 10/17/15   Linna HoffJames D Kindl, MD  ipratropium (ATROVENT) 0.06 % nasal spray Place 2 sprays into both nostrils 4 (four) times daily. 10/17/15   Linna HoffJames D Kindl, MD  PRESCRIPTION MEDICATION Take 1 tablet by mouth daily. Birth Control    Historical Provider, MD  sertraline (ZOLOFT) 50 MG tablet Take 1 tablet (50 mg total) by mouth daily. 09/12/14   Marlow Baarsyanna Clark, MD    Family History No family history on file.  Social History Social History  Substance Use Topics  . Smoking status: Former Smoker    Quit date: 02/07/2014  . Smokeless tobacco: Never Used  . Alcohol use 0.0 oz/week   Allergies   Patient has no known allergies.   Review of Systems Review of Systems  Constitutional: Positive for fever.  HENT: Positive for sneezing and sore throat.   Respiratory: Positive for cough and shortness of breath.    Physical Exam Updated Vital Signs BP 137/85   Pulse 105   Temp 98.1 F (36.7 C) (Oral)   Resp 18   LMP 11/16/2015 (Approximate)   SpO2 100%   Physical Exam Physical Exam  Constitutional: Pt  is oriented to person, place, and time. Appears well-developed and well-nourished. No distress.  HENT:  Head: Normocephalic and atraumatic.  Right Ear: Tympanic membrane, external ear and ear canal normal.  Left Ear: Tympanic membrane,  external ear and ear canal normal.  Nose: Mucosal edema and moderate rhinorrhea present. No epistaxis. Right sinus exhibits no maxillary sinus tenderness and no frontal sinus tenderness. Left sinus exhibits no maxillary sinus tenderness and no frontal sinus tenderness.  Mouth/Throat: Uvula is midline and mucous membranes are normal. Mucous membranes are not pale and not cyanotic. No oropharyngeal exudate, posterior oropharyngeal edema, posterior oropharyngeal erythema or tonsillar abscesses.  Eyes: Conjunctivae are normal. Pupils are equal, round, and reactive to light.  Neck: Normal range of motion and full passive range of motion without pain.    Cardiovascular: Normal rate and intact distal pulses.   Pulmonary/Chest: Effort normal and breath sounds normal. No stridor.  Clear and equal breath sounds without focal wheezes, rhonchi, rales  Abdominal: Soft. Bowel sounds are normal. There is no tenderness.  Musculoskeletal: Normal range of motion.  Lymphadenopathy:    Pthas no cervical adenopathy.  Neurological: Pt is alert and oriented to person, place, and time.  Skin: Skin is warm and dry. No rash noted. Pt is not diaphoretic.  Psychiatric: Normal mood and affect.  Nursing note and vitals reviewed.   ED Treatments / Results  Labs (all labs ordered are listed, but only abnormal results are displayed) Labs Reviewed - No data to display  EKG  EKG Interpretation None       Radiology Dg Chest 2 View  Result Date: 12/13/2015 CLINICAL DATA:  Intermittent cough starting July EXAM: CHEST  2 VIEW COMPARISON:  None. FINDINGS: The heart size and mediastinal contours are within normal limits. Both lungs are clear. The visualized skeletal structures are unremarkable. IMPRESSION: No active cardiopulmonary disease. Electronically Signed   By: Natasha MeadLiviu  Pop M.D.   On: 12/13/2015 15:19    Procedures Procedures (including critical care time)  Medications Ordered in ED Medications - No data to display  DIAGNOSTIC STUDIES:  Oxygen Saturation is 100% on RA, normal by my interpretation.    COORDINATION OF CARE:  2:52 PM Discussed treatment plan with pt at bedside and pt agreed to plan.  Initial Impression / Assessment and Plan / ED Course  I have reviewed the triage vital signs and the nursing notes.  Pertinent labs & imaging results that were available during my care of the patient were reviewed by me and considered in my medical decision making (see chart for details).  Clinical Course     Patient with cough and cold symptoms.  States that she has been getting sick repeatedly and has had a lingering cough.  CXR is clear.  No  fever.  She may have bronchitis and given length of symptoms may benefit from and antibiotic.  I personally performed the services described in this documentation, which was scribed in my presence. The recorded information has been reviewed and is accurate.   Final Clinical Impressions(s) / ED Diagnoses   Final diagnoses:  Upper respiratory tract infection, unspecified type    New Prescriptions Discharge Medication List as of 12/13/2015  3:36 PM    START taking these medications   Details  azithromycin (ZITHROMAX) 250 MG tablet Take 1 tablet (250 mg total) by mouth daily. Take first 2 tablets together, then 1 every day until finished., Starting Tue 12/13/2015, Print         Roxy HorsemanRobert Haelee Bolen, PA-C 12/13/15 1550    Rolland PorterMark James, MD 01/03/16 1525

## 2016-02-08 ENCOUNTER — Ambulatory Visit (HOSPITAL_COMMUNITY)
Admission: EM | Admit: 2016-02-08 | Discharge: 2016-02-08 | Disposition: A | Discharge status: Home / Self Care | Payer: Medicaid Other | Attending: Emergency Medicine | Admitting: Emergency Medicine

## 2016-02-08 ENCOUNTER — Encounter (HOSPITAL_COMMUNITY): Payer: Self-pay | Admitting: *Deleted

## 2016-02-08 DIAGNOSIS — J00 Acute nasopharyngitis [common cold]: Secondary | ICD-10-CM | POA: Diagnosis not present

## 2016-02-08 MED ORDER — AZITHROMYCIN 250 MG PO TABS: 250.0000 mg | ORAL_TABLET | Freq: Every day | ORAL | 0 refills | Status: DC

## 2016-02-08 MED ORDER — IPRATROPIUM BROMIDE 0.06 % NA SOLN: 2.0000 | Freq: Four times a day (QID) | NASAL | 0 refills | Status: DC

## 2016-02-08 NOTE — ED Triage Notes (Signed)
Pt  Reports  Congested   Beginning  To  Get  Hoarse        Symptoms   X  5  days  Also has   Some  Malaise     Child  Is ill  As  Well

## 2016-02-08 NOTE — ED Provider Notes (Signed)
CSN: [id number]     Arrival date & time 02/08/16  1055 History   First MD Initiated Contact with Patient 02/08/16 1215     Chief Complaint  Patient presents with  . URI   (Consider location/radiation/quality/duration/timing/severity/associated sxs/prior Treatment) Patient c/o uri sx's for over a week.   The history is provided by the patient.  URI  Presenting symptoms: congestion   Severity:  Moderate Onset quality:  Sudden Duration:  5 days Timing:  Constant Progression:  Worsening Chronicity:  New Relieved by:  Nothing Worsened by:  Nothing Ineffective treatments:  None tried   Past Medical History:  Diagnosis Date  . Asthma   . Depression   . Preeclampsia    Past Surgical History:  Procedure Laterality Date  . CESAREAN SECTION N/A 09/07/2014   Procedure: CESAREAN SECTION;  Surgeon: Essie HartWalda Pinn, MD;  Location: [facility];  Service: Obstetrics;  Laterality: N/A;   History reviewed. No pertinent family history. Social History  Substance Use Topics  . Smoking status: Former Smoker    Quit date: 02/07/2014  . Smokeless tobacco: Never Used  . Alcohol use 0.0 oz/week   OB History    Gravida Para Term Preterm AB Living   1 1   1   1    SAB TAB Ectopic Multiple Live Births         0 1     Review of Systems  Constitutional: Negative.   HENT: Positive for congestion.   Eyes: Negative.   Respiratory: Negative.   Cardiovascular: Negative.   Gastrointestinal: Negative.   Endocrine: Negative.   Genitourinary: Negative.   Musculoskeletal: Negative.   Allergic/Immunologic: Negative.   Neurological: Negative.   Hematological: Negative.  Negative for adenopathy.  Psychiatric/Behavioral: Negative.     Allergies  Patient has no known allergies.  Home Medications   Prior to Admission medications   Medication Sig Start Date End Date Taking? Authorizing Provider  azithromycin (ZITHROMAX) 250 MG tablet Take 1 tablet (250 mg total) by mouth daily. Take first 2 tablets  together, then 1 every day until finished. 12/13/15   Roxy Horsemanobert Browning, PA-C  azithromycin (ZITHROMAX) 250 MG tablet Take 1 tablet (250 mg total) by mouth daily. Take first 2 tablets together, then 1 every day until finished. 02/08/16   Deatra CanterWilliam J Alroy Portela, FNP  cephALEXin (KEFLEX) 500 MG capsule Take 1 capsule (500 mg total) by mouth 4 (four) times daily. Take all of medicine and drink lots of fluids 10/17/15   Linna HoffJames D Kindl, MD  ipratropium (ATROVENT) 0.06 % nasal spray Place 2 sprays into both nostrils 4 (four) times daily. 10/17/15   Linna HoffJames D Kindl, MD  ipratropium (ATROVENT) 0.06 % nasal spray Place 2 sprays into both nostrils 4 (four) times daily. 02/08/16   Deatra CanterWilliam J Dorraine Ellender, FNP  PRESCRIPTION MEDICATION Take 1 tablet by mouth daily. Birth Control    Historical Provider, MD  sertraline (ZOLOFT) 50 MG tablet Take 1 tablet (50 mg total) by mouth daily. 09/12/14   Marlow Baarsyanna Clark, MD   Meds Ordered and Administered this Visit  Medications - No data to display  BP 112/72 (BP Location: Right Arm)   Pulse 98   Temp 99.2 F (37.3 C) (Oral)   Resp 18   LMP 02/08/2016   SpO2 98%   Breastfeeding? No Comment: spotting  now No data found.   Physical Exam  Constitutional: She appears well-developed and well-nourished.  HENT:  Head: Normocephalic and atraumatic.  Right Ear: External ear normal.  Left Ear: External  ear normal.  Mouth/Throat: Oropharynx is clear and moist.  Eyes: Conjunctivae and EOM are normal. Pupils are equal, round, and reactive to light.  Neck: Normal range of motion. Neck supple.  Cardiovascular: Normal rate, regular rhythm and normal heart sounds.   Pulmonary/Chest: Effort normal and breath sounds normal.  Abdominal: Soft. Bowel sounds are normal.  Nursing note and vitals reviewed.   Urgent Care Course   Clinical Course     Procedures (including critical care time)  Labs Review Labs Reviewed - No data to display  Imaging Review No results found.   Visual Acuity  Review  Right Eye Distance:   Left Eye Distance:   Bilateral Distance:    Right Eye Near:   Left Eye Near:    Bilateral Near:         MDM   1. Acute nasopharyngitis    zpak atrovent nasal spray  Push po fluids, rest, tylenol and motrin otc prn as directed for fever, arthralgias, and myalgias.  Follow up prn if sx's continue or persist.   Deatra Canter, FNP 02/08/16 1227

## 2016-02-25 ENCOUNTER — Ambulatory Visit (HOSPITAL_COMMUNITY)
Admission: EM | Admit: 2016-02-25 | Discharge: 2016-02-25 | Disposition: A | Discharge status: Home / Self Care | Payer: Medicaid Other

## 2016-03-01 ENCOUNTER — Ambulatory Visit (HOSPITAL_COMMUNITY)
Admission: EM | Admit: 2016-03-01 | Discharge: 2016-03-01 | Disposition: A | Discharge status: Home / Self Care | Payer: Medicaid Other | Attending: Family Medicine | Admitting: Family Medicine

## 2016-03-01 ENCOUNTER — Encounter (HOSPITAL_COMMUNITY): Payer: Self-pay | Admitting: Emergency Medicine

## 2016-03-01 DIAGNOSIS — R05 Cough: Secondary | ICD-10-CM | POA: Diagnosis not present

## 2016-03-01 DIAGNOSIS — R0982 Postnasal drip: Secondary | ICD-10-CM | POA: Diagnosis not present

## 2016-03-01 DIAGNOSIS — R059 Cough, unspecified: Secondary | ICD-10-CM

## 2016-03-01 DIAGNOSIS — J9801 Acute bronchospasm: Secondary | ICD-10-CM | POA: Diagnosis not present

## 2016-03-01 MED ORDER — ALBUTEROL SULFATE HFA 108 (90 BASE) MCG/ACT IN AERS: 2.0000 | INHALATION_SPRAY | RESPIRATORY_TRACT | 0 refills | Status: DC | PRN

## 2016-03-01 MED ORDER — PREDNISONE 50 MG PO TABS: ORAL_TABLET | ORAL | 0 refills | Status: DC

## 2016-03-01 NOTE — Discharge Instructions (Signed)
Your cough is primarily due to the bronchospasm and wheezing in your chest. He also have some drainage in the back of your throat which also contributes to cough. Recommend taking Allegra or Zyrtec daily for drainage and start using the albuterol HFA inhaler 2 puffs every 4 hours as needed for cough and wheeze. He may need to use this every 4 hours for the next 24-48 hours and then back off as needed. Take the prednisone as directed this will help with swelling and inflammation in your airways. B sure to drink plenty fluids and continue to sip on water and stay well-hydrated.

## 2016-03-01 NOTE — ED Triage Notes (Signed)
Recurrent episodes of chest congestion and cough.  Patient reports intermittent wheezing.  Patient says a family member is concerned for pneumonia.    Patient is wanting to see a new born infant to the family and wants to know if contagious

## 2016-03-01 NOTE — ED Provider Notes (Signed)
CSN: [id number]     Arrival date & time 03/01/16  1504 History   First MD Initiated Contact with Patient 03/01/16 1528     Chief Complaint  Patient presents with  . Cough   (Consider location/radiation/quality/duration/timing/severity/associated sxs/prior Treatment) 26 year old female complaining of cough and chest congestion for several weeks. Sometimes she has coughing spasms and her ears feel stopped up and occasionally has a sore throat. She has been to the emergency department and urgent care a few times for was diagnosis chest congestion. She has had normal chest x-rays and she has been treated with antibiotics. Her symptoms are persistent. She states occasionally she has mild shortness of breath. Denies fevers or chills.      Past Medical History:  Diagnosis Date  . Asthma   . Depression   . Preeclampsia    Past Surgical History:  Procedure Laterality Date  . CESAREAN SECTION N/A 09/07/2014   Procedure: CESAREAN SECTION;  Surgeon: Essie HartWalda Pinn, MD;  Location: [facility];  Service: Obstetrics;  Laterality: N/A;   No family history on file. Social History  Substance Use Topics  . Smoking status: Former Smoker    Quit date: 02/07/2014  . Smokeless tobacco: Never Used  . Alcohol use 0.0 oz/week   OB History    Gravida Para Term Preterm AB Living   1 1   1   1    SAB TAB Ectopic Multiple Live Births         0 1     Review of Systems  Constitutional: Positive for activity change. Negative for appetite change, chills, fatigue and fever.  HENT: Positive for congestion, postnasal drip and rhinorrhea. Negative for facial swelling.   Eyes: Negative.   Respiratory: Positive for cough.   Cardiovascular: Negative.   Gastrointestinal: Negative.   Musculoskeletal: Negative for neck pain and neck stiffness.  Skin: Negative for pallor and rash.  Neurological: Negative.   All other systems reviewed and are negative.   Allergies  Patient has no known allergies.  Home Medications    Prior to Admission medications   Medication Sig Start Date End Date Taking? Authorizing Provider  Lurasidone HCl (LATUDA PO) Take by mouth.   Yes Historical Provider, MD  Prochlorperazine Maleate (COMPAZINE PO) Take by mouth.   Yes Historical Provider, MD  albuterol (PROVENTIL HFA;VENTOLIN HFA) 108 (90 Base) MCG/ACT inhaler Inhale 2 puffs into the lungs every 4 (four) hours as needed for wheezing or shortness of breath. 03/01/16   Hayden Rasmussenavid Farrel Guimond, NP  ipratropium (ATROVENT) 0.06 % nasal spray Place 2 sprays into both nostrils 4 (four) times daily. 10/17/15   Linna HoffJames D Kindl, MD  ipratropium (ATROVENT) 0.06 % nasal spray Place 2 sprays into both nostrils 4 (four) times daily. 02/08/16   Deatra CanterWilliam J Oxford, FNP  predniSONE (DELTASONE) 50 MG tablet 1 tab po daily for 6 days. Take with food. 03/01/16   Hayden Rasmussenavid Ravan Schlemmer, NP  PRESCRIPTION MEDICATION Take 1 tablet by mouth daily. Birth Control    Historical Provider, MD   Meds Ordered and Administered this Visit  Medications - No data to display  BP 108/60 (BP Location: Left Arm)   Pulse 100   Temp 98 F (36.7 C) (Oral)   Resp 20   LMP 02/08/2016   SpO2 97%  No data found.   Physical Exam  Constitutional: She is oriented to person, place, and time. She appears well-developed and well-nourished. No distress.  HENT:  Right Ear: External ear normal.  Left Ear: External ear normal.  Mouth/Throat: No oropharyngeal exudate.  Bilateral TMs are normal. Oropharynx with minimal erythema and clear PND otherwise normal.  Eyes: EOM are normal. Pupils are equal, round, and reactive to light.  Neck: Normal range of motion. Neck supple.  Cardiovascular: Normal rate, regular rhythm, normal heart sounds and intact distal pulses.   Pulmonary/Chest: Effort normal. No respiratory distress. She has wheezes. She has no rales.  Good air movement. On expiration there is bilateral diffuse wheezes and rhonchi. No crackles.  Musculoskeletal: Normal range of motion. She exhibits no  edema.  Lymphadenopathy:    She has no cervical adenopathy.  Neurological: She is alert and oriented to person, place, and time.  Skin: Skin is warm and dry. Capillary refill takes less than 2 seconds.  Psychiatric: She has a normal mood and affect.  Nursing note and vitals reviewed.   Urgent Care Course     Procedures (including critical care time)  Labs Review Labs Reviewed - No data to display  Imaging Review No results found.   Visual Acuity Review  Right Eye Distance:   Left Eye Distance:   Bilateral Distance:    Right Eye Near:   Left Eye Near:    Bilateral Near:         MDM   1. Cough   2. PND (post-nasal drip)   3. Bronchospasm    Your cough is primarily due to the bronchospasm and wheezing in your chest. He also have some drainage in the back of your throat which also contributes to cough. Recommend taking Allegra or Zyrtec daily for drainage and start using the albuterol HFA inhaler 2 puffs every 4 hours as needed for cough and wheeze. He may need to use this every 4 hours for the next 24-48 hours and then back off as needed. Take the prednisone as directed this will help with swelling and inflammation in your airways. B sure to drink plenty fluids and continue to sip on water and stay well-hydrated. Meds ordered this encounter  Medications  . Lurasidone HCl (LATUDA PO)    Sig: Take by mouth.  . Prochlorperazine Maleate (COMPAZINE PO)    Sig: Take by mouth.  Marland Kitchen albuterol (PROVENTIL HFA;VENTOLIN HFA) 108 (90 Base) MCG/ACT inhaler    Sig: Inhale 2 puffs into the lungs every 4 (four) hours as needed for wheezing or shortness of breath.    Dispense:  1 Inhaler    Refill:  0    Order Specific Question:   Supervising Provider    Answer:   Elvina Sidle [[id number]]  . predniSONE (DELTASONE) 50 MG tablet    Sig: 1 tab po daily for 6 days. Take with food.    Dispense:  6 tablet    Refill:  0    Order Specific Question:   Supervising Provider    Answer:    Elvina Sidle [[id number]]      Hayden Rasmussen, NP 03/01/16 1636

## 2016-04-21 ENCOUNTER — Emergency Department (HOSPITAL_COMMUNITY): Payer: Medicaid Other

## 2016-04-21 ENCOUNTER — Emergency Department (HOSPITAL_COMMUNITY)
Admission: EM | Admit: 2016-04-21 | Discharge: 2016-04-21 | Disposition: A | Discharge status: Home / Self Care | Payer: Medicaid Other | Attending: Emergency Medicine | Admitting: Emergency Medicine

## 2016-04-21 ENCOUNTER — Encounter (HOSPITAL_COMMUNITY): Payer: Self-pay

## 2016-04-21 DIAGNOSIS — Z87891 Personal history of nicotine dependence: Secondary | ICD-10-CM | POA: Diagnosis not present

## 2016-04-21 DIAGNOSIS — J45901 Unspecified asthma with (acute) exacerbation: Secondary | ICD-10-CM | POA: Insufficient documentation

## 2016-04-21 DIAGNOSIS — R05 Cough: Secondary | ICD-10-CM

## 2016-04-21 DIAGNOSIS — R059 Cough, unspecified: Secondary | ICD-10-CM

## 2016-04-21 IMAGING — DX DG CHEST 2V
2 series · 2 of 2 positions shown · non-contrast
Comparison: December 13, 2015

CLINICAL DATA: Cough for 1 week.

EXAM:
CHEST  2 VIEW

[chest pa]
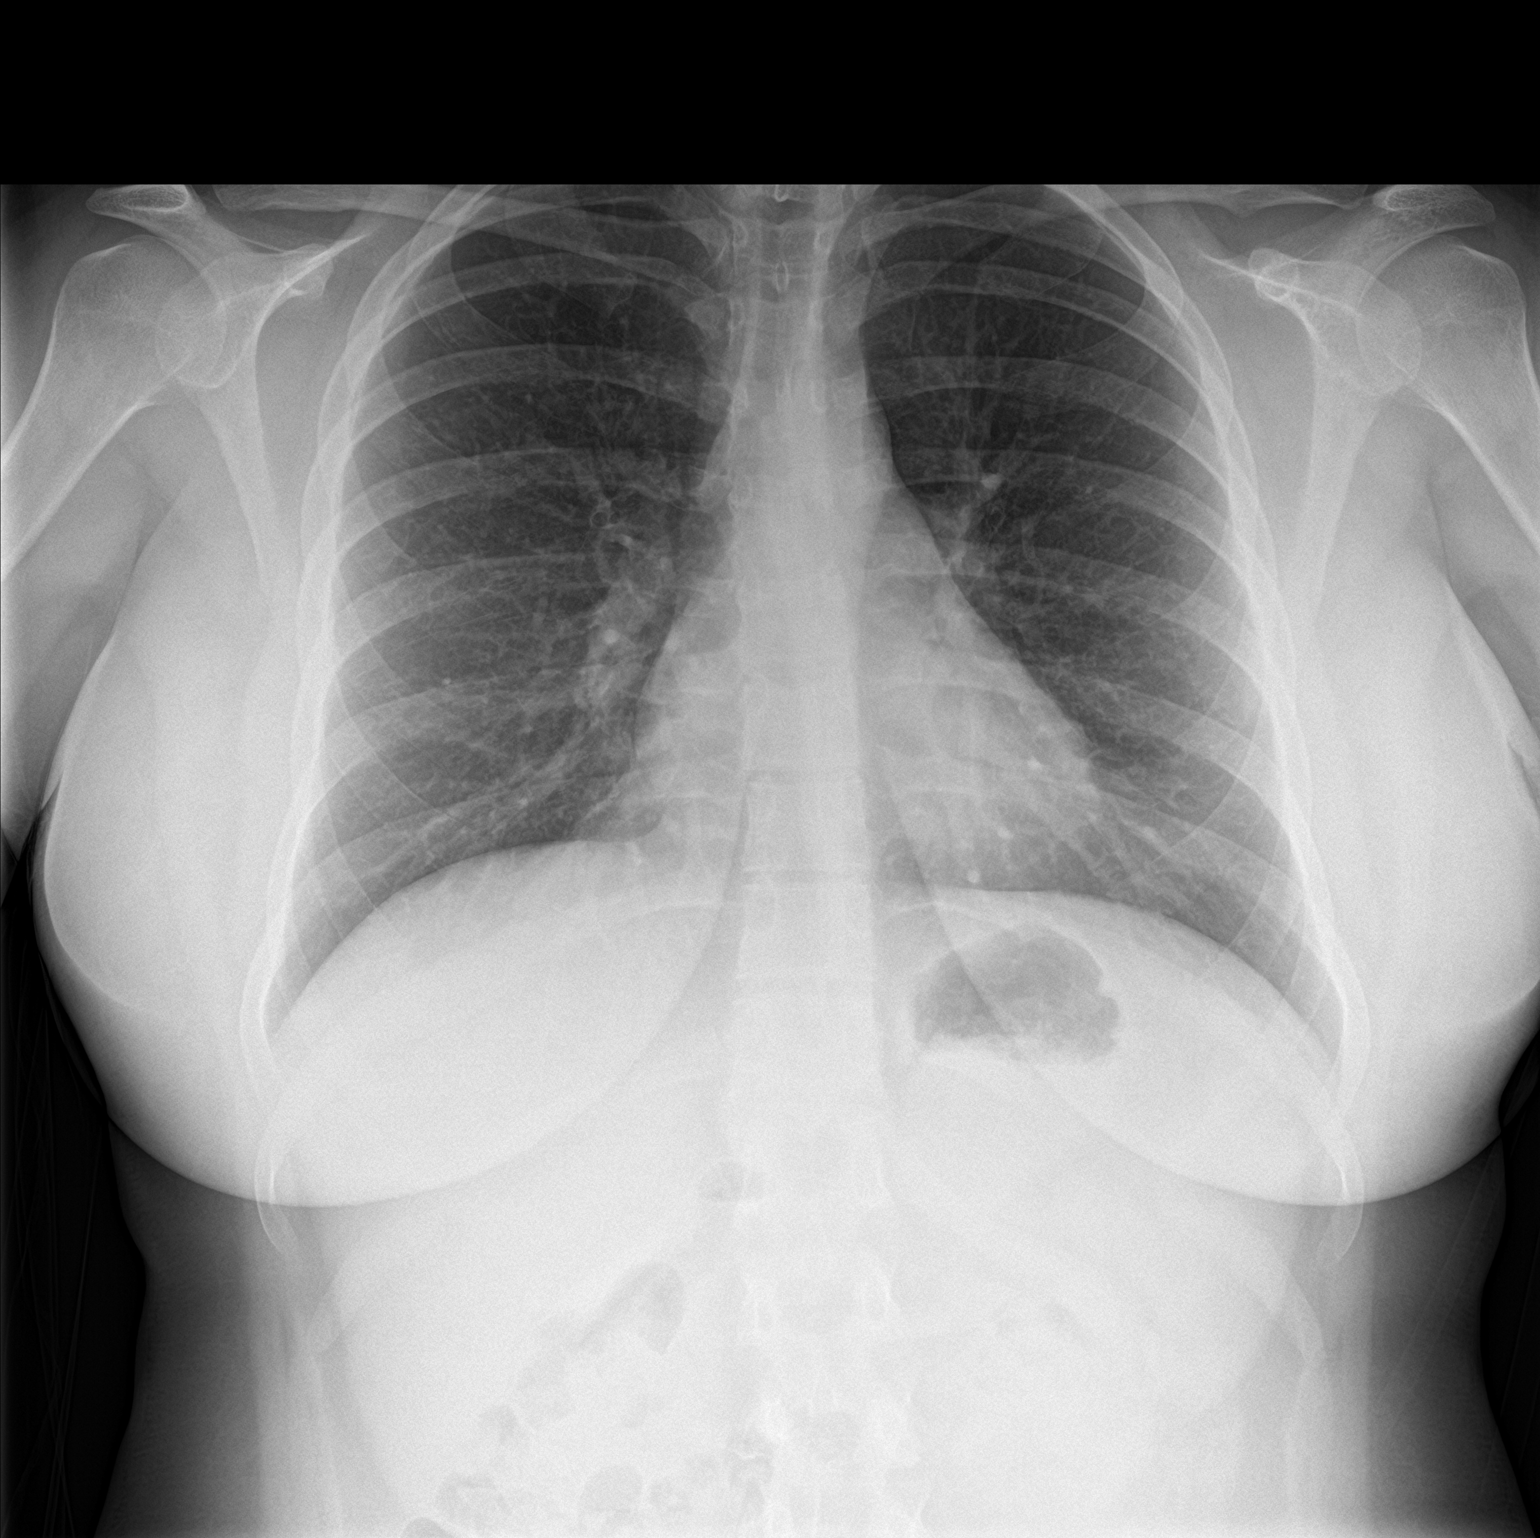

[chest lat]
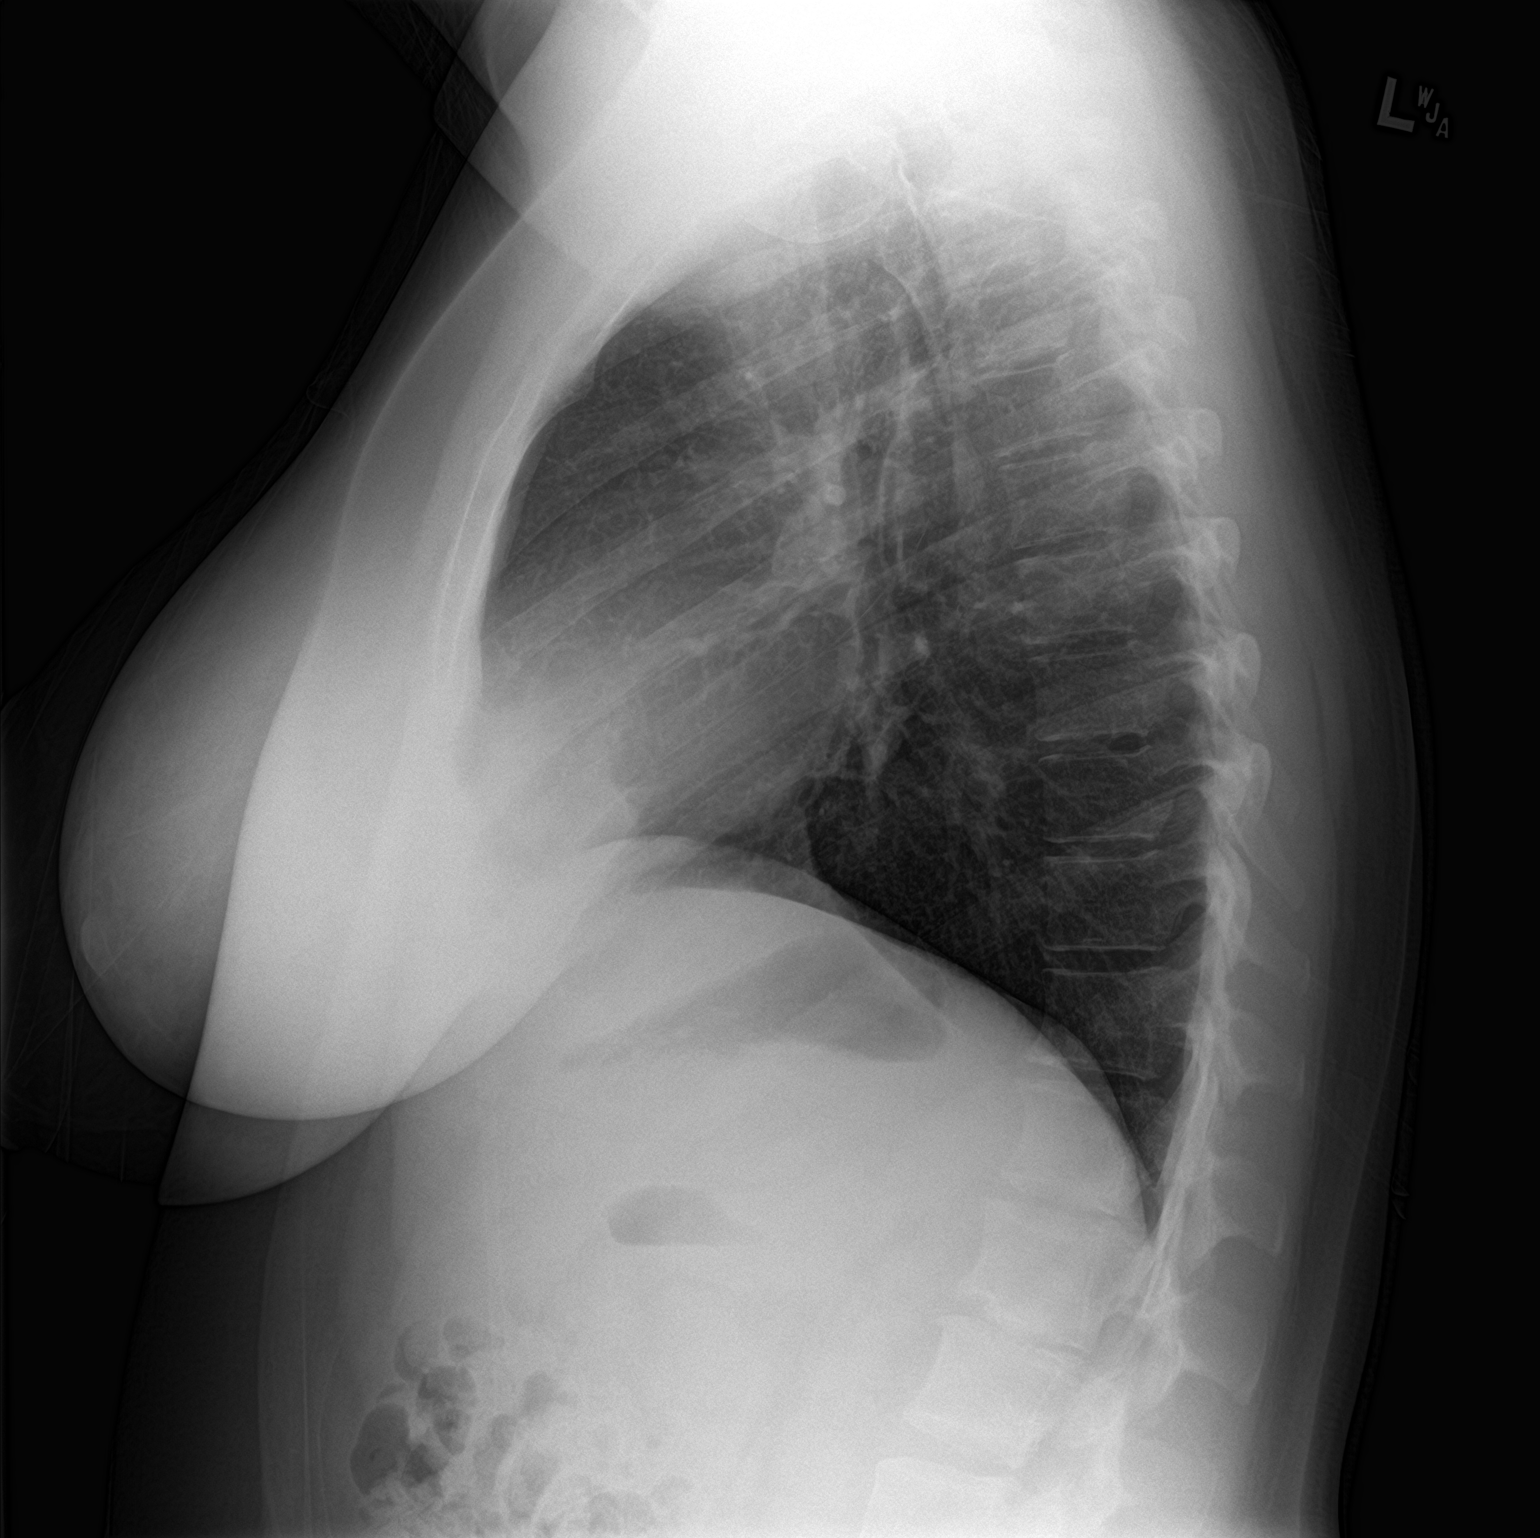

[2 of 2 positions shown; findings below may reference images not displayed]

FINDINGS: The heart size and mediastinal contours are within normal limits.
Both lungs are clear. The visualized skeletal structures are
unremarkable.
IMPRESSION: No active cardiopulmonary disease.

## 2016-04-21 MED ORDER — ALBUTEROL SULFATE HFA 108 (90 BASE) MCG/ACT IN AERS: 2.0000 | INHALATION_SPRAY | RESPIRATORY_TRACT | 0 refills | Status: DC | PRN

## 2016-04-21 MED ORDER — BENZONATATE 100 MG PO CAPS: 100.0000 mg | ORAL_CAPSULE | Freq: Three times a day (TID) | ORAL | 0 refills | Status: DC

## 2016-04-21 MED ORDER — PREDNISONE 50 MG PO TABS: ORAL_TABLET | ORAL | 0 refills | Status: DC

## 2016-04-21 NOTE — ED Notes (Signed)
Patient transported to X-ray 

## 2016-04-21 NOTE — ED Triage Notes (Signed)
Pt complaining of cough x 1 week. Pt denies any fevers or chills. Pt denies any chest pain or N/V. Pt states some SOB with cough.

## 2016-04-21 NOTE — Discharge Instructions (Signed)
Medications: Prednisone, Tessalon, albuterol inhaler  Treatment: Take prednisone as prescribed for 5 days. Take Tessalon every 8 hours as needed for cough. Take albuterol inhaler every 4-6 hours as needed for shortness of breath or wheezing.  Follow-up: Please follow-up and establish care with a primary care provider by calling the number circled below. Please return to emergency department if you develop any new or worsening symptoms.

## 2016-04-21 NOTE — ED Provider Notes (Signed)
[facility]Y DEPT Provider Note   CSN: [id number] Arrival date & time: 04/21/16  [id number]    By signing my name below, I, Valentino SaxonBianca Contreras, attest that this documentation has been prepared under the direction and in the presence of Buel ReamAlexandra Elizabth Palka, PA-C. Electronically Signed: Valentino SaxonBianca Contreras, ED Scribe. 04/21/16. 8:58 PM.  History   Chief Complaint Chief Complaint  Patient presents with  . Cough   The history is provided by the patient. No language interpreter was used.   HPI Comments: Christy Bell is a 26 y.o. female with PMHx of asthma who presents to the Emergency Department complaining of moderate, constant, non-productive cough onset ~2 weeks ago. Pt reports associated intermittent nasal congestion followed by runny nose, sore throat, mild wheezing and chest soreness secondary to cough. Pt notes she works a lot in the back window of [employer] and believes the change in weather may have caused the spike in her cough. Pt was last seen in the ED on 03/01/16 and was diagnosed with bronchospasm and wheezing. Pt was advised to use Allegra or Zyrtec followed by albuterol inhaler as needed for cough. Pt reports using the albuterol inhaler at home with minimal relief. Pt denies fever, chills, CP, abdominal pain, nausea, vomiting, ear pain.   Past Medical History:  Diagnosis Date  . Asthma   . Depression   . Preeclampsia     Patient Active Problem List   Diagnosis Date Noted  . Cesarean delivery delivered 09/07/2014  . Hypertension complicating pregnancy 09/05/2014  . Decreased fetal movement   . Fetal heart rate decelerations affecting management of mother   . [redacted] weeks gestation of pregnancy   . Preeclampsia     Past Surgical History:  Procedure Laterality Date  . CESAREAN SECTION N/A 09/07/2014   Procedure: CESAREAN SECTION;  Surgeon: Essie HartWalda Pinn, MD;  Location: [facility];  Service: Obstetrics;  Laterality: N/A;    OB History    Gravida Para Term Preterm AB Living   1 1   1    1    SAB TAB Ectopic Multiple Live Births         0 1       Home Medications    Prior to Admission medications   Medication Sig Start Date End Date Taking? Authorizing Provider  albuterol (PROVENTIL HFA;VENTOLIN HFA) 108 (90 Base) MCG/ACT inhaler Inhale 2 puffs into the lungs every 4 (four) hours as needed for wheezing or shortness of breath. 04/21/16   Dhani Imel M Moorea Boissonneault, PA-C  benzonatate (TESSALON) 100 MG capsule Take 1 capsule (100 mg total) by mouth every 8 (eight) hours. 04/21/16   Emi HolesAlexandra M Suheyb Raucci, PA-C  ipratropium (ATROVENT) 0.06 % nasal spray Place 2 sprays into both nostrils 4 (four) times daily. 10/17/15   Linna HoffJames D Kindl, MD  ipratropium (ATROVENT) 0.06 % nasal spray Place 2 sprays into both nostrils 4 (four) times daily. 02/08/16   Deatra CanterWilliam J Oxford, FNP  Lurasidone HCl (LATUDA PO) Take by mouth.    Historical Provider, MD  predniSONE (DELTASONE) 50 MG tablet 1 tab po daily for 5 days. Take with food. 04/21/16   Emi HolesAlexandra M Pedrohenrique Mcconville, PA-C  PRESCRIPTION MEDICATION Take 1 tablet by mouth daily. Birth Control    Historical Provider, MD  Prochlorperazine Maleate (COMPAZINE PO) Take by mouth.    Historical Provider, MD    Family History History reviewed. No pertinent family history.  Social History Social History  Substance Use Topics  . Smoking status: Former Smoker    Quit date: 02/07/2014  .  Smokeless tobacco: Never Used  . Alcohol use 0.0 oz/week     Allergies   Patient has no known allergies.   Review of Systems Review of Systems  Constitutional: Negative for chills and fever.  HENT: Positive for congestion, rhinorrhea and sore throat. Negative for ear pain and facial swelling.   Respiratory: Positive for cough, shortness of breath and wheezing.   Cardiovascular: Positive for chest pain (2/2 cough).  Gastrointestinal: Negative for abdominal pain, nausea and vomiting.  Genitourinary: Negative for dysuria.  Musculoskeletal: Negative for back pain.  Skin: Negative for rash and  wound.  Neurological: Negative for headaches.  Psychiatric/Behavioral: The patient is not nervous/anxious.      Physical Exam Updated Vital Signs BP 118/73 (BP Location: Right Arm)   Pulse (!) 106   Temp 98.3 F (36.8 C) (Oral)   Resp (!) 22   LMP 03/28/2016 (Approximate)   SpO2 98%   Physical Exam  Constitutional: She appears well-developed and well-nourished. No distress.  HENT:  Head: Normocephalic and atraumatic.  Mouth/Throat: Oropharynx is clear and moist. No oropharyngeal exudate.  Eyes: Conjunctivae are normal. Pupils are equal, round, and reactive to light. Right eye exhibits no discharge. Left eye exhibits no discharge. No scleral icterus.  Neck: Normal range of motion. Neck supple. No thyromegaly present.  Cardiovascular: Regular rhythm, normal heart sounds and intact distal pulses.  Exam reveals no gallop and no friction rub.   No murmur heard. Pulmonary/Chest: Effort normal. No stridor. No respiratory distress. She has no wheezes. She has no rales.  Decreased lung sounds on right base.   Abdominal: Soft. Bowel sounds are normal. She exhibits no distension. There is no tenderness. There is no rebound and no guarding.  Musculoskeletal: She exhibits no edema.  Lymphadenopathy:    She has no cervical adenopathy.  Neurological: She is alert. Coordination normal.  Skin: Skin is warm and dry. No rash noted. She is not diaphoretic. No pallor.  Psychiatric: She has a normal mood and affect.  Nursing note and vitals reviewed.    ED Treatments / Results   DIAGNOSTIC STUDIES: Oxygen Saturation is 98% on RA, normal by my interpretation.    COORDINATION OF CARE: 8:58 PM Discussed treatment plan with pt at bedside which includes chest imaging and pt agreed to plan.   Labs (all labs ordered are listed, but only abnormal results are displayed) Labs Reviewed - No data to display  EKG  EKG Interpretation None       Radiology Dg Chest 2 View  Result Date:  04/21/2016 CLINICAL DATA:  Cough for 1 week. EXAM: CHEST  2 VIEW COMPARISON:  December 13, 2015 FINDINGS: The heart size and mediastinal contours are within normal limits. Both lungs are clear. The visualized skeletal structures are unremarkable. IMPRESSION: No active cardiopulmonary disease. Electronically Signed   By: Gerome Sam III M.D   On: 04/21/2016 21:33    Procedures Procedures (including critical care time)  Medications Ordered in ED Medications - No data to display   Initial Impression / Assessment and Plan / ED Course  I have reviewed the triage vital signs and the nursing notes.  Pertinent labs & imaging results that were available during my care of the patient were reviewed by me and considered in my medical decision making (see chart for details).     Patient with mild signs and symptoms of asthma/RAD. Oxygen saturation is above 90%. No accessory muscle use, no cyanosis. CXR shows no active cardiopulmonary disease. Will discharge with 5  day burst of prednisone, albuterol inhaler, Tessalon. Pt instructed to follow up with PCP. Patient is hemodynamically stable. Discussed return precautions. Patient vitals stable throughout ED course and discharged in satisfactory condition.  Final Clinical Impressions(s) / ED Diagnoses   Final diagnoses:  Exacerbation of asthma, unspecified asthma severity, unspecified whether persistent  Cough    New Prescriptions New Prescriptions   BENZONATATE (TESSALON) 100 MG CAPSULE    Take 1 capsule (100 mg total) by mouth every 8 (eight) hours.    I personally performed the services described in this documentation, which was scribed in my presence. The recorded information has been reviewed and is accurate.    Emi Holes, PA-C 04/21/16 2153    Melene Plan, DO 04/21/16 2220

## 2016-04-21 NOTE — ED Notes (Signed)
Pt presents with cough x 1 week.  Was recently dx with bronchospasm, given antibiotics and an inhaler, which improved symptoms.  After abx, cough came back.

## 2016-05-18 ENCOUNTER — Encounter: Payer: Self-pay | Admitting: Family

## 2016-05-18 ENCOUNTER — Ambulatory Visit (INDEPENDENT_AMBULATORY_CARE_PROVIDER_SITE_OTHER): Payer: Self-pay | Admitting: Family

## 2016-05-18 VITALS — BP 110/70 | HR 97 | Temp 98.6°F | Ht 64.0 in | Wt 198.4 lb

## 2016-05-18 DIAGNOSIS — Z0000 Encounter for general adult medical examination without abnormal findings: Secondary | ICD-10-CM

## 2016-05-18 NOTE — Progress Notes (Signed)
[facility]  Chief Complaint  Patient presents with  . cpe      ICD-9-CM ICD-10-CM   1. Annual physical exam V70.0 Z00.00     Patient Active Problem List   Diagnosis Date Noted  . Annual physical exam 05/18/2016  . Cesarean delivery delivered 09/07/2014  . Hypertension complicating pregnancy 09/05/2014  . Decreased fetal movement   . Fetal heart rate decelerations affecting management of mother   . [redacted] weeks gestation of pregnancy   . Preeclampsia     Past Medical History:  Diagnosis Date  . Asthma   . Depression   . Preeclampsia     Past Surgical History:  Procedure Laterality Date  . CESAREAN SECTION N/A 09/07/2014   Procedure: CESAREAN SECTION;  Surgeon: Essie Hart, MD;  Location: [facility];  Service: Obstetrics;  Laterality: N/A;    No Known Allergies  BP 110/70   Pulse 97   Temp 98.6 F (37 C)   Ht  (1.626 m)   Wt 198 lb 6.4 oz (90 kg)   SpO2 98%   BMI 34.06 kg/m   Review of Systems  Constitutional: Negative.   HENT: Negative.   Eyes: Negative.   Respiratory: Negative.   Cardiovascular: Negative.   Gastrointestinal: Negative.   Genitourinary: Negative.   Musculoskeletal: Negative.   Skin: Negative.   Neurological: Negative.   Endo/Heme/Allergies: Negative.   Psychiatric/Behavioral: Positive for depression (stable with medication). Negative for suicidal ideas.  All other systems reviewed and are negative.  Physical Exam  Constitutional: She is oriented to person, place, and time. She appears well-developed and well-nourished.  HENT:  Head: Normocephalic and atraumatic.  Right Ear: External ear normal.  Left Ear: External ear normal.  Nose: Nose normal.  Mouth/Throat: Oropharynx is clear and moist.  Eyes: Conjunctivae and EOM are normal. Pupils are equal, round, and reactive to light.  Neck: Normal range of motion. Neck supple.  Cardiovascular: Normal rate, regular rhythm, normal heart sounds and intact distal pulses.    Pulmonary/Chest: Effort normal and breath sounds normal.  Abdominal: Soft. Bowel sounds are normal.  Genitourinary:  Genitourinary Comments: deferred  Musculoskeletal: Normal range of motion.  Neurological: She is alert and oriented to person, place, and time.  Skin: Skin is warm and dry. Capillary refill takes less than 2 seconds.  Psychiatric: She has a normal mood and affect. Her behavior is normal. Thought content normal.  Vitals reviewed.   No results found for this or any previous visit (from the past 72 hour(s)).   Current Outpatient Prescriptions:  .  albuterol (PROVENTIL HFA;VENTOLIN HFA) 108 (90 Base) MCG/ACT inhaler, Inhale 2 puffs into the lungs every 4 (four) hours as needed for wheezing or shortness of breath., Disp: 1 Inhaler, Rfl: 0 .  benzonatate (TESSALON) 100 MG capsule, Take 1 capsule (100 mg total) by mouth every 8 (eight) hours., Disp: 21 capsule, Rfl: 0 .  ipratropium (ATROVENT) 0.06 % nasal spray, Place 2 sprays into both nostrils 4 (four) times daily., Disp: 15 mL, Rfl: 1 .  ipratropium (ATROVENT) 0.06 % nasal spray, Place 2 sprays into both nostrils 4 (four) times daily., Disp: 15 mL, Rfl: 0 .  Lurasidone HCl (LATUDA PO), Take by mouth., Disp: , Rfl:  .  predniSONE (DELTASONE) 50 MG tablet, 1 tab po daily for 5 days. Take with food., Disp: 5 tablet, Rfl: 0 .  PRESCRIPTION MEDICATION, Take 1 tablet by mouth daily. Birth Control, Disp: , Rfl:  .  Prochlorperazine Maleate (COMPAZINE PO), Take  by mouth., Disp: , Rfl:   Subjective: "I'm here for a physical for a new job to work at a [employer]."  Objective: Pt seen and chart reviewed. Pt is alert, oriented x3, calm, cooperative, in NAD. See objective physical findings above. No physical limitations which would be of concern. 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   Assessment:  -Annual Physical Exam - no abnormalities noted  Plan: Continue current  exercise/nutrition/diet regimen. No changes or medications recommended at this time.   Beau Fanny, [place] 05/18/2016 12:44 PM

## 2016-06-24 ENCOUNTER — Emergency Department (HOSPITAL_COMMUNITY)
Admission: EM | Admit: 2016-06-24 | Discharge: 2016-06-24 | Disposition: A | Discharge status: Home / Self Care | Payer: Medicaid Other | Attending: Emergency Medicine | Admitting: Emergency Medicine

## 2016-06-24 ENCOUNTER — Encounter (HOSPITAL_COMMUNITY): Payer: Self-pay | Admitting: Emergency Medicine

## 2016-06-24 DIAGNOSIS — Z87891 Personal history of nicotine dependence: Secondary | ICD-10-CM | POA: Diagnosis not present

## 2016-06-24 DIAGNOSIS — Z3200 Encounter for pregnancy test, result unknown: Secondary | ICD-10-CM | POA: Diagnosis present

## 2016-06-24 DIAGNOSIS — J45909 Unspecified asthma, uncomplicated: Secondary | ICD-10-CM | POA: Diagnosis not present

## 2016-06-24 DIAGNOSIS — N9489 Other specified conditions associated with female genital organs and menstrual cycle: Secondary | ICD-10-CM | POA: Insufficient documentation

## 2016-06-24 DIAGNOSIS — Z79899 Other long term (current) drug therapy: Secondary | ICD-10-CM | POA: Insufficient documentation

## 2016-06-24 DIAGNOSIS — I10 Essential (primary) hypertension: Secondary | ICD-10-CM | POA: Insufficient documentation

## 2016-06-24 DIAGNOSIS — Z3A01 Less than 8 weeks gestation of pregnancy: Secondary | ICD-10-CM

## 2016-06-24 DIAGNOSIS — Z3201 Encounter for pregnancy test, result positive: Secondary | ICD-10-CM | POA: Diagnosis not present

## 2016-06-24 LAB — HCG, QUANTITATIVE, PREGNANCY: HCG, BETA CHAIN, QUANT, S: 181 m[IU]/mL — AB (ref <5)

## 2016-06-24 NOTE — ED Notes (Signed)
Pt stable, ambulatory, states understanding of discharge instructions 

## 2016-06-24 NOTE — Discharge Instructions (Signed)
Start your prenatal vitamins and prenatal care. [facility] any [facility] related problems.

## 2016-06-24 NOTE — ED Notes (Signed)
Received busy signal after calling pharmacy multiple attempts.  Consult put in for face to face with patient

## 2016-06-24 NOTE — ED Provider Notes (Signed)
[facility] Provider Note   CSN: [id number] Arrival date & time: 06/24/16  2136  By signing my name below, I, Diona Browner, attest that this documentation has been prepared under the direction and in the presence of [facility] M. Damian Leavell, FNP.  Electronically Signed: Diona Browner, ED Scribe. 06/24/16. 10:31 PM.  History   Chief Complaint Chief Complaint  Patient presents with  . Possible Pregnancy    HPI Christy Bell is a 26 y.o. female who presents to the Emergency Department due to a postivie pregnancy. Pt took a pregnancy test at home that came back positive. Associated sx include nausea, vomiting, and vaginal discharge (mucus). She is also worried about any possible negative affects of taking Jordan everyday if she is indeed pregnant. Her last normal menstrual period was ~ 05/23/16. She reports she has been with two different people in the past. Pt would also like a blood test to see how far along she is. She has been pregnant before and has an ~ almost 24 year old at home. Pt denies fever, chills, diarrhea, breast tenderness, abdominal pain, and vaginal bleeding. Patient states that she needs to know when she got pregnant so she will know which man is the father.   The history is provided by the patient and a parent. No language interpreter was used.  Possible Pregnancy  This is a new problem. The current episode started more than 1 week ago. Pertinent negatives include no abdominal pain and no headaches. Nothing aggravates the symptoms. Nothing relieves the symptoms.    Past Medical History:  Diagnosis Date  . Asthma   . Depression   . Preeclampsia     Patient Active Problem List   Diagnosis Date Noted  . Annual physical exam 05/18/2016  . Cesarean delivery delivered 09/07/2014  . Hypertension complicating pregnancy 09/05/2014  . Decreased fetal movement   . Fetal heart rate decelerations affecting management of mother   . [redacted] weeks gestation of pregnancy   .  Preeclampsia     Past Surgical History:  Procedure Laterality Date  . CESAREAN SECTION N/A 09/07/2014   Procedure: CESAREAN SECTION;  Surgeon: Essie Hart, MD;  Location: [facility];  Service: Obstetrics;  Laterality: N/A;    OB History    Gravida Para Term Preterm AB Living   1 1   1   1    SAB TAB Ectopic Multiple Live Births         0 1       Home Medications    Prior to Admission medications   Medication Sig Start Date End Date Taking? Authorizing Provider  albuterol (PROVENTIL HFA;VENTOLIN HFA) 108 (90 Base) MCG/ACT inhaler Inhale 2 puffs into the lungs every 4 (four) hours as needed for wheezing or shortness of breath. 04/21/16   Law, Waylan Boga, PA-C  benzonatate (TESSALON) 100 MG capsule Take 1 capsule (100 mg total) by mouth every 8 (eight) hours. 04/21/16   Law, Waylan Boga, PA-C  ipratropium (ATROVENT) 0.06 % nasal spray Place 2 sprays into both nostrils 4 (four) times daily. 10/17/15   Linna Hoff, MD  ipratropium (ATROVENT) 0.06 % nasal spray Place 2 sprays into both nostrils 4 (four) times daily. 02/08/16   Deatra Canter, FNP  Lurasidone HCl (LATUDA PO) Take by mouth.    [provider]  predniSONE (DELTASONE) 50 MG tablet 1 tab po daily for 5 days. Take with food. 04/21/16   Emi Holes, PA-C  PRESCRIPTION MEDICATION Take 1 tablet by mouth  daily. Birth Control    [provider]  Prochlorperazine Maleate (COMPAZINE PO) Take by mouth.    [provider]    Family History No family history on file.  Social History Social History  Substance Use Topics  . Smoking status: Former Smoker    Quit date: 02/07/2014  . Smokeless tobacco: Never Used  . Alcohol use 0.0 oz/week     Allergies   Patient has no known allergies.   Review of Systems Review of Systems  Constitutional: Negative for chills and fever.  Gastrointestinal: Positive for nausea and vomiting. Negative for abdominal pain and diarrhea.  Genitourinary: Positive for vaginal  discharge. Negative for vaginal bleeding.  Skin: Negative for rash.  Neurological: Negative for headaches.     Physical Exam Updated Vital Signs BP 111/64 (BP Location: Right Arm)   Pulse 100   Temp 98.8 F (37.1 C) (Oral)   Resp 16   Ht 5\' 3"  (1.6 m)   Wt 195 lb 12.3 oz (88.8 kg)   LMP 05/14/2016 (Approximate)   SpO2 100%   BMI 34.68 kg/m   Physical Exam  Constitutional: She appears well-developed and well-nourished. No distress.  HENT:  Head: Normocephalic.  Eyes: EOM are normal.  Neck: Neck supple.  Cardiovascular: Normal rate and regular rhythm.   Pulmonary/Chest: Effort normal and breath sounds normal.  Abdominal: Soft. Bowel sounds are normal. There is no tenderness.  Musculoskeletal: Normal range of motion. She exhibits no edema.  Neurological: She is alert.  Skin: Skin is warm and dry.  Psychiatric: Her mood appears anxious.  Nursing note and vitals reviewed.    ED Treatments / Results  DIAGNOSTIC STUDIES: Oxygen Saturation is 100% on RA, normal by my interpretation.   COORDINATION OF CARE: 10:26 PM-Discussed next steps with pt. Pt verbalized understanding and is agreeable with the plan.    Labs (all labs ordered are listed, but only abnormal results are displayed) Labs Reviewed  HCG, QUANTITATIVE, PREGNANCY - Abnormal; Notable for the following:       Result Value   hCG, Beta Chain, Quant, S 181 (*)    All other components within normal limits    Radiology No results found.  Procedures Procedures (including critical care time)  Medications Ordered in ED Medications - No data to display   Initial Impression / Assessment and Plan / ED Course  I have reviewed the triage vital signs and the nursing notes.  Pertinent lab results that were available during my care of the patient were reviewed by me and considered in my medical decision making (see chart for details).  Final Clinical Impressions(s) / ED Diagnoses  26 y.o. female with positive  pregnancy test and no complaints of abdominal pain or other problems other than one day of n/v stable for d/c. This does not appear to be a medical emergency. Discussed with the patient prenatal vitamins and prenatal care and [facility].  Final diagnoses:  Less than [redacted] weeks gestation of pregnancy   Consult with pharmacy regarding current medications and pregnancy.   New Prescriptions New Prescriptions   No medications on file   I personally performed the services described in this documentation, which was scribed in my presence. The recorded information has been reviewed and is accurate.     Kerrie Buffaloeese, Hope Bloom[place], [place] 06/24/16 2320    Jacalyn LefevreHaviland, Julie, MD 06/26/16 1207

## 2016-06-24 NOTE — ED Notes (Signed)
Delay in lab draw,  Pt in room. 

## 2016-06-24 NOTE — ED Triage Notes (Signed)
Pt took a pregnancy test at home which came back positive.  If positive she has questions about taking medications she is already on.

## 2016-06-24 NOTE — ED Notes (Signed)
Spoke with Pharmacy regarding request for face to face pharmacy to bedside

## 2016-06-24 NOTE — ED Notes (Signed)
Acuity changed to 3 d/t pelvic exam

## 2016-06-24 NOTE — Progress Notes (Signed)
PHARMACY CONSULT NOTE - INITIAL   Pharmacy Consult for Review of medications in new pregnancy    Assessment: Reviewed current home meds w/ pt and family members (Latuda, Compazine, ranitidine, ibuprofen).  Pt reports that she sees therapist weekly.  She also speaks to prescriber associated w/ therapist via Skype or telephone.  Pt's main concern is JordanLatuda, which she has been taking for at least several months.  Discussed risks and benefits of continuing vs stopping Latuda; if pt chooses to stop JordanLatuda discussed slow taper.  Pt has next therapist appointment on this Tuesday.  Pt reports that she has previously been on Prozac and Zoloft; she had good response to Zoloft.  Plan:  Recommended alerting therapist and prescriber re: pregnancy and concern w/ Latuda and advocating for self; recommended changing PRN analgesic to APAP.  Pt had no further questions.  Vernard GamblesVeronda Treysen Sudbeck, PharmD, BCPS  06/24/2016,11:32 PM

## 2016-07-23 LAB — OB RESULTS CONSOLE GC/CHLAMYDIA
CHLAMYDIA, DNA PROBE: NEGATIVE
GC PROBE AMP, GENITAL: NEGATIVE

## 2016-07-23 LAB — OB RESULTS CONSOLE ABO/RH: RH Type: POSITIVE

## 2016-07-23 LAB — OB RESULTS CONSOLE HEPATITIS B SURFACE ANTIGEN: HEP B S AG: NEGATIVE

## 2016-07-23 LAB — OB RESULTS CONSOLE ANTIBODY SCREEN: ANTIBODY SCREEN: NEGATIVE

## 2016-07-23 LAB — OB RESULTS CONSOLE RPR: RPR: NONREACTIVE

## 2016-07-23 LAB — OB RESULTS CONSOLE HIV ANTIBODY (ROUTINE TESTING): HIV: NONREACTIVE

## 2016-07-23 LAB — OB RESULTS CONSOLE RUBELLA ANTIBODY, IGM: RUBELLA: NON-IMMUNE/NOT IMMUNE

## 2016-07-27 DIAGNOSIS — Z3490 Encounter for supervision of normal pregnancy, unspecified, unspecified trimester: Secondary | ICD-10-CM | POA: Insufficient documentation

## 2016-09-22 ENCOUNTER — Other Ambulatory Visit: Payer: Self-pay

## 2016-10-31 ENCOUNTER — Encounter (HOSPITAL_COMMUNITY): Payer: Self-pay | Admitting: *Deleted

## 2016-10-31 ENCOUNTER — Inpatient Hospital Stay (HOSPITAL_COMMUNITY)
Admission: AD | Admit: 2016-10-31 | Discharge: 2016-10-31 | Disposition: A | Discharge status: Home / Self Care | Payer: Medicaid Other | Source: Ambulatory Visit | Attending: Obstetrics and Gynecology | Admitting: Obstetrics and Gynecology

## 2016-10-31 DIAGNOSIS — W010XXA Fall on same level from slipping, tripping and stumbling without subsequent striking against object, initial encounter: Secondary | ICD-10-CM | POA: Diagnosis not present

## 2016-10-31 DIAGNOSIS — S59902A Unspecified injury of left elbow, initial encounter: Secondary | ICD-10-CM | POA: Diagnosis not present

## 2016-10-31 DIAGNOSIS — O26892 Other specified pregnancy related conditions, second trimester: Secondary | ICD-10-CM | POA: Diagnosis not present

## 2016-10-31 DIAGNOSIS — Z3A22 22 weeks gestation of pregnancy: Secondary | ICD-10-CM | POA: Diagnosis not present

## 2016-10-31 DIAGNOSIS — O9A212 Injury, poisoning and certain other consequences of external causes complicating pregnancy, second trimester: Secondary | ICD-10-CM | POA: Diagnosis not present

## 2016-10-31 DIAGNOSIS — S8992XA Unspecified injury of left lower leg, initial encounter: Secondary | ICD-10-CM | POA: Diagnosis not present

## 2016-10-31 DIAGNOSIS — W19XXXA Unspecified fall, initial encounter: Secondary | ICD-10-CM | POA: Diagnosis present

## 2016-10-31 DIAGNOSIS — Z87891 Personal history of nicotine dependence: Secondary | ICD-10-CM | POA: Insufficient documentation

## 2016-10-31 LAB — URINALYSIS, ROUTINE W REFLEX MICROSCOPIC
Bilirubin Urine: NEGATIVE
GLUCOSE, UA: NEGATIVE mg/dL
HGB URINE DIPSTICK: NEGATIVE
KETONES UR: NEGATIVE mg/dL
Leukocytes, UA: NEGATIVE
Nitrite: NEGATIVE
PROTEIN: NEGATIVE mg/dL
Specific Gravity, Urine: 1.006 (ref 1.005–1.030)
pH: 8 (ref 5.0–8.0)

## 2016-10-31 MED ORDER — ACETAMINOPHEN 500 MG PO TABS
1000.0000 mg | ORAL_TABLET | Freq: Once | ORAL | Status: AC
  Administered 2016-10-31: 1000 mg via ORAL
  Filled 2016-10-31: qty 2

## 2016-10-31 NOTE — MAU Note (Signed)
End of shift; report given to Suzette Battiest, Press photographer.

## 2016-10-31 NOTE — MAU Provider Note (Signed)
History     CSN: [id number]  Arrival date and time: 10/31/16 6578   First Provider Initiated Contact with Patient 10/31/16 2056      Chief Complaint  Patient presents with  . Fall    Fall = landed [place] left side (at the park)   HPI  Christy Bell is a 26 y.o. G2P0101 at [redacted]w[redacted]d gestation presenting to MAU d/t a fall at the park at 3:30-4:00 PM. She stepped over a barrier to a play area, she tripped and landed on her LT side hitting her LT elbow and LT knee. She did not hit her stomach. She reports some soreness on her LT side and her LT lower back.    Past Medical History:  Diagnosis Date  . Asthma   . Depression   . Preeclampsia     Past Surgical History:  Procedure Laterality Date  . CESAREAN SECTION N/A 09/07/2014   Procedure: CESAREAN SECTION;  Surgeon: Essie Hart, MD;  Location: [facility];  Service: Obstetrics;  Laterality: N/A;    History reviewed. No pertinent family history.  Social History  Substance Use Topics  . Smoking status: Former Smoker    Quit date: 02/07/2014  . Smokeless tobacco: Never Used  . Alcohol use No    Allergies: No Known Allergies  Prescriptions Prior to Admission  Medication Sig Dispense Refill Last Dose  . albuterol (PROVENTIL HFA;VENTOLIN HFA) 108 (90 Base) MCG/ACT inhaler Inhale 2 puffs into the lungs every 4 (four) hours as needed for wheezing or shortness of breath. 1 Inhaler 0   . benzonatate (TESSALON) 100 MG capsule Take 1 capsule (100 mg total) by mouth every 8 (eight) hours. 21 capsule 0   . ipratropium (ATROVENT) 0.06 % nasal spray Place 2 sprays into both nostrils 4 (four) times daily. 15 mL 1   . ipratropium (ATROVENT) 0.06 % nasal spray Place 2 sprays into both nostrils 4 (four) times daily. 15 mL 0   . Lurasidone HCl (LATUDA PO) Take by mouth.     . predniSONE (DELTASONE) 50 MG tablet 1 tab po daily for 5 days. Take with food. 5 tablet 0   . PRESCRIPTION MEDICATION Take 1 tablet by mouth daily. Birth Control   12/14/2014  at Unknown time  . Prochlorperazine Maleate (COMPAZINE PO) Take by mouth.       Review of Systems  Constitutional: Negative.   HENT: Negative.   Eyes: Negative.   Respiratory: Negative.   Cardiovascular: Negative.   Gastrointestinal: Negative.   Endocrine: Negative.   Genitourinary: Negative.   Musculoskeletal: Positive for back pain.       LT elbow and LT knee  Skin: Negative.   Allergic/Immunologic: Negative.   Neurological: Negative.   Hematological: Negative.   Psychiatric/Behavioral: Negative.    Physical Exam   Blood pressure 103/63, pulse 85, temperature 99 F (37.2 C), temperature source Oral, resp. rate 18, height  (1.6 m), weight 84.8 kg (187 lb), last menstrual period 05/14/2016.  Physical Exam  Constitutional: She is oriented to person, place, and time. She appears well-developed and well-nourished.  HENT:  Head: Normocephalic.  Eyes: Pupils are equal, round, and reactive to light.  Neck: Normal range of motion.  Cardiovascular: Normal rate, regular rhythm and normal heart sounds.   Respiratory: Effort normal and breath sounds normal.  GI: Soft. Bowel sounds are normal.  Musculoskeletal: Normal range of motion.  Neurological: She is alert and oriented to person, place, and time.  Skin: Skin is warm and dry.  Psychiatric: She has a normal mood and affect. Her behavior is normal. Judgment and thought content normal.    MAU Course  Procedures  MDM CCUA FHTs by doppler: 154 bpm Tylenol 1000 mg -- improved pain  Results for orders placed or performed during the hospital encounter of 10/31/16 (from the past 24 hour(s))  Urinalysis, Routine w reflex microscopic     Status: Abnormal   Collection Time: 10/31/16  8:00 PM  Result Value Ref Range   Color, Urine STRAW (A) YELLOW   APPearance CLEAR CLEAR   Specific Gravity, Urine 1.006 1.005 - 1.030   pH 8.0 5.0 - 8.0   Glucose, UA NEGATIVE NEGATIVE mg/dL   Hgb urine dipstick NEGATIVE NEGATIVE   Bilirubin  Urine NEGATIVE NEGATIVE   Ketones, ur NEGATIVE NEGATIVE mg/dL   Protein, ur NEGATIVE NEGATIVE mg/dL   Nitrite NEGATIVE NEGATIVE   Leukocytes, UA NEGATIVE NEGATIVE    Assessment and Plan  Fall, initial encounter - Bleeding precautions reviewed - Information on preventing injuries in pregnancy - Advised to call OB office for bleeding, abdominal pain not relieved by Tylenol Discharge home Patient verbalized an understanding of the plan of care and agrees.   Raelyn Mora, MSN, CNM 10/31/2016, 8:57 PM

## 2016-10-31 NOTE — MAU Note (Signed)
Pt. Here due to fall at the park with her 26 year old daughter around 3:30-4:00 pm.  Pt. Larey Seat over barrier in the park and landed on left side, did not hit stomach. Currently experiencing soreness on left side and lower left back.

## 2016-10-31 NOTE — Discharge Instructions (Signed)
Take Tylenol 1000 mg by mouth every 6 hours as needed for pain  Preventing Injuries During Pregnancy Trauma is the most common cause of injury and death in pregnant women. This can also result in serious harm to the baby or even death. How can injuries affect my pregnancy? Your baby is protected in the womb (uterus) by a sac filled with fluid (amniotic sac). Your baby can be harmed if there is a direct blow to your abdomen and pelvis. Trauma may be caused by:  Falls. These are more common in the second and third trimester of pregnancy.  Automobile accidents.  Domestic violence or assault.  Severe burns, such as from fire or electricity.  These injuries can result in:  Tearing of your uterus.  The placenta pulling away from the wall of the uterus (placental abruption).  The amniotic sac breaking open (rupture of membranes).  Blockage or decrease in the blood supply to your baby.  Going into labor earlier than expected.  Severe injuries to other parts of your body, such as your brain, spine, heart, lungs, or other organs.  Minor falls and low-impact automobile accidents do not usually harm your baby, even if they cause a little harm to you. What can I do to lower my risk? Safety  Remove slippery rugs and loose objects on the floor. They increase your risk of tripping or slipping.  Wear comfortable shoes that have a good grip on the sole. Do not wear high-heeled shoes.  Always wear your seat belt properly when riding in a car. Use both the lap and shoulder belt, with the lap belt below your abdomen. Always practice safe driving. Do not ride on a motorcycle while pregnant. Activity  Avoid walking on wet or slippery floors.  Do not participate in rough and violent activities or sports.  Avoid high-risk situations and activities such as: ? Lifting heavy pots of boiling or hot liquids. ? Fixing electrical problems. ? Being near fires or starting fires. General  instructions  Take over-the-counter and prescription medicines only as told by your health care provider.  Know your blood type and the father's blood type in case you develop vaginal bleeding or experience an injury for which a blood transfusion is needed.  Spousal abuse can be a serious cause of trauma during pregnancy. If you are a victim of domestic violence or assault: ? Call your local emergency services (911 in the [place] ? Contact the [employer] Violence Hotline for help and support. When should I seek immediate medical care? Get help right away if:  You fall on your abdomen or experience any serious blow to your abdomen.  You develop stiffness in your neck or pain after a fall or from other trauma.  You develop a headache or vision problems after a fall or from other trauma.  You do not feel the baby moving after a fall or trauma, or you feel that the baby is not moving as much as before the fall or trauma.  You have been the victim of domestic violence or any other kind of physical attack.  You have been in a car accident.  You develop vaginal bleeding.  You have fluid leaking from the vagina.  You develop uterine contractions. Symptoms include pelvic cramping, pain, or serious low back pain.  You become weak, faint, or have uncontrolled vomiting after trauma.  You have a serious burn. This includes burns to the face, neck, hands, or genitals, or burns greater than the size of  your palm anywhere else.  Summary  Trauma is the most common cause of injury and death in pregnant women and can also lead to injury or death of the baby.  Falls, automobile accidents, domestic violence or assault, and severe burns can injure you or your baby. Make sure to get medical help right away if you experience any of these during your pregnancy.  Take steps to prevent slips or falls in your home, such as avoiding slippery floors and removing loose rugs.  Always wear your seat  belt properly when riding in a car. Practice safe driving. This information is not intended to replace advice given to you by your health care provider. Make sure you discuss any questions you have with your health care provider. Document Released: 03/01/2004 Document Revised: 02/01/2016 Document Reviewed: 02/01/2016 Elsevier Interactive Patient Education  2017 [employer].

## 2016-11-09 ENCOUNTER — Other Ambulatory Visit (HOSPITAL_COMMUNITY): Payer: Self-pay | Admitting: Obstetrics & Gynecology

## 2016-11-09 DIAGNOSIS — Z3A25 25 weeks gestation of pregnancy: Secondary | ICD-10-CM

## 2016-11-09 DIAGNOSIS — Z3689 Encounter for other specified antenatal screening: Secondary | ICD-10-CM

## 2016-11-09 DIAGNOSIS — O358XX0 Maternal care for other (suspected) fetal abnormality and damage, not applicable or unspecified: Secondary | ICD-10-CM

## 2016-11-09 DIAGNOSIS — O35AXX0 Maternal care for other (suspected) fetal abnormality and damage, fetal facial anomalies, not applicable or unspecified: Secondary | ICD-10-CM

## 2016-11-13 ENCOUNTER — Encounter (HOSPITAL_COMMUNITY): Payer: Self-pay | Admitting: Obstetrics & Gynecology

## 2016-11-21 ENCOUNTER — Ambulatory Visit (HOSPITAL_COMMUNITY)
Admission: RE | Admit: 2016-11-21 | Discharge: 2016-11-21 | Disposition: A | Discharge status: Home / Self Care | Payer: Medicaid Other | Source: Ambulatory Visit | Attending: Obstetrics & Gynecology | Admitting: Obstetrics & Gynecology

## 2016-11-21 ENCOUNTER — Encounter (HOSPITAL_COMMUNITY): Payer: Self-pay

## 2016-11-21 DIAGNOSIS — O35AXX0 Maternal care for other (suspected) fetal abnormality and damage, fetal facial anomalies, not applicable or unspecified: Secondary | ICD-10-CM

## 2016-11-21 DIAGNOSIS — O358XX0 Maternal care for other (suspected) fetal abnormality and damage, not applicable or unspecified: Secondary | ICD-10-CM | POA: Diagnosis present

## 2016-11-21 DIAGNOSIS — Z3A25 25 weeks gestation of pregnancy: Secondary | ICD-10-CM | POA: Diagnosis not present

## 2016-11-21 DIAGNOSIS — O99212 Obesity complicating pregnancy, second trimester: Secondary | ICD-10-CM | POA: Diagnosis not present

## 2016-11-21 DIAGNOSIS — W19XXXA Unspecified fall, initial encounter: Secondary | ICD-10-CM

## 2016-11-21 DIAGNOSIS — O283 Abnormal ultrasonic finding on antenatal screening of mother: Secondary | ICD-10-CM | POA: Insufficient documentation

## 2016-11-21 DIAGNOSIS — Z3689 Encounter for other specified antenatal screening: Secondary | ICD-10-CM

## 2016-11-21 IMAGING — US US MFM OB DETAIL+14 WK
1 series · 14 of 28 positions shown · non-contrast
Comparison: none

[Series 1: us mfm ob detail+14 wk · 82 acquisitions, 14 frames shown]
[im 4/82]
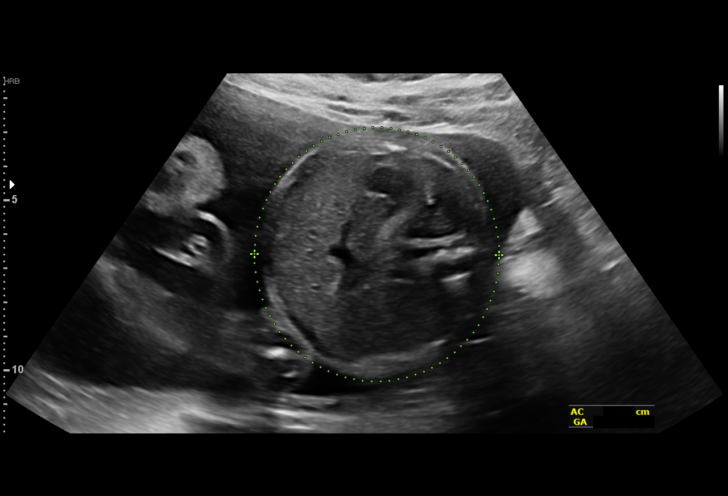
[im 10/82]
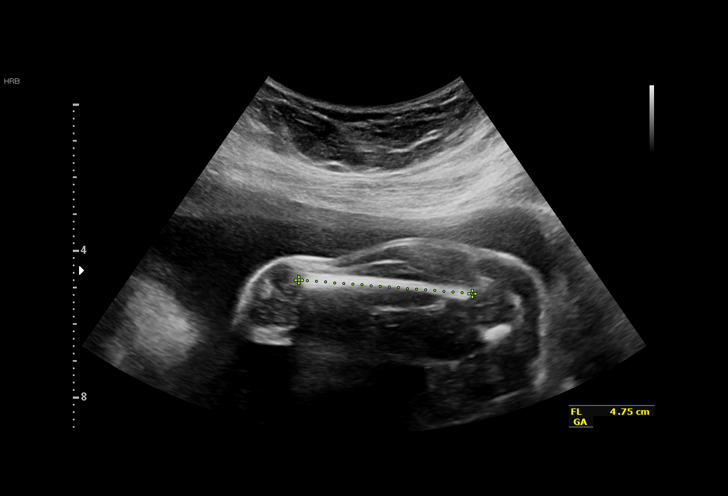
[im 16/82]
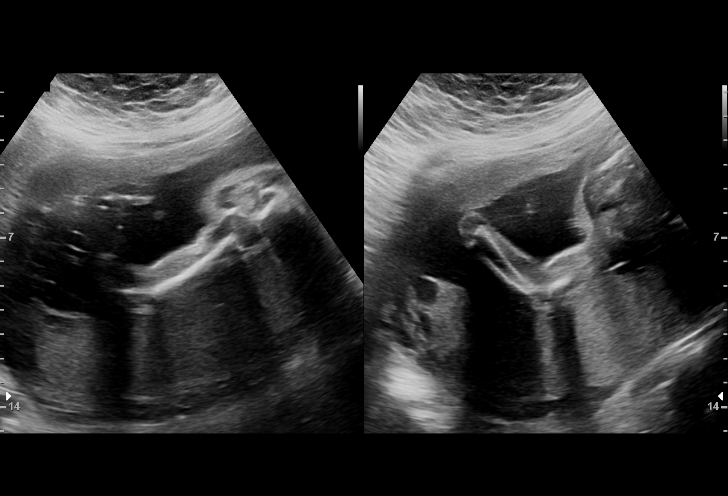
[im 22/82]
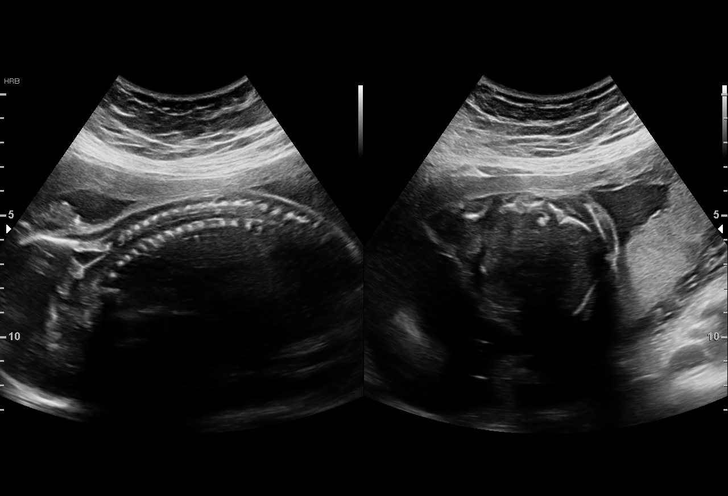
[im 28/82]
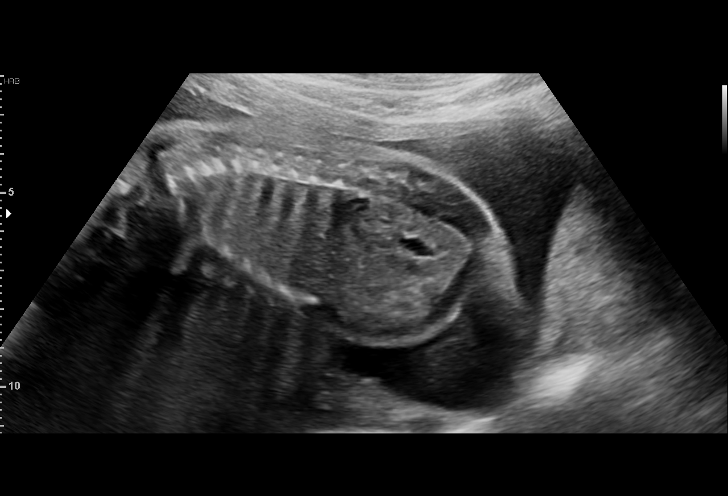
[im 34/82]
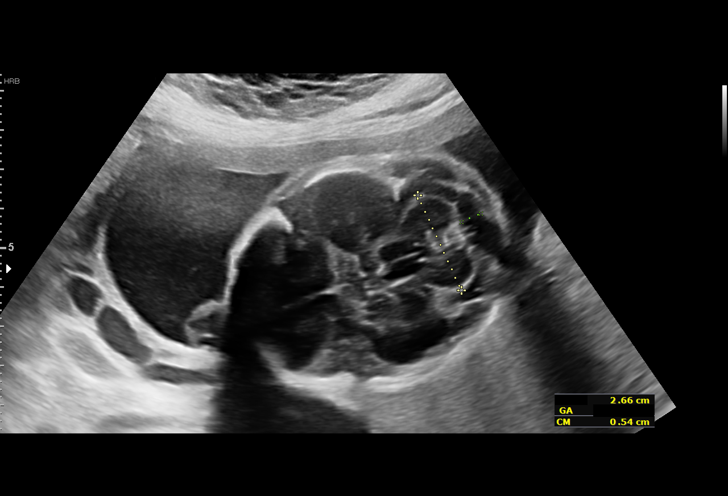
[im 40/82]
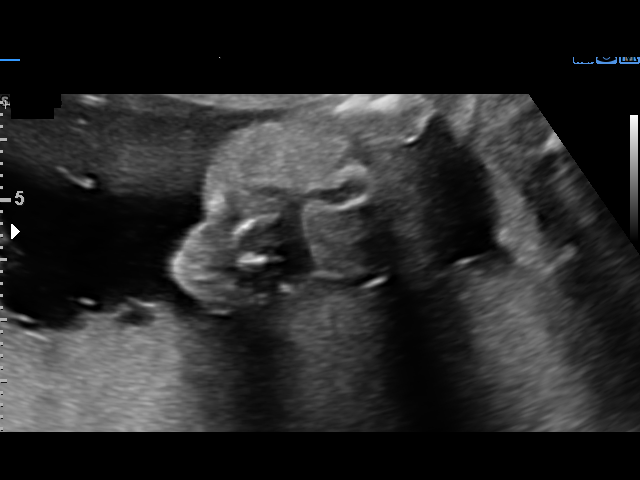
[im 46/82]
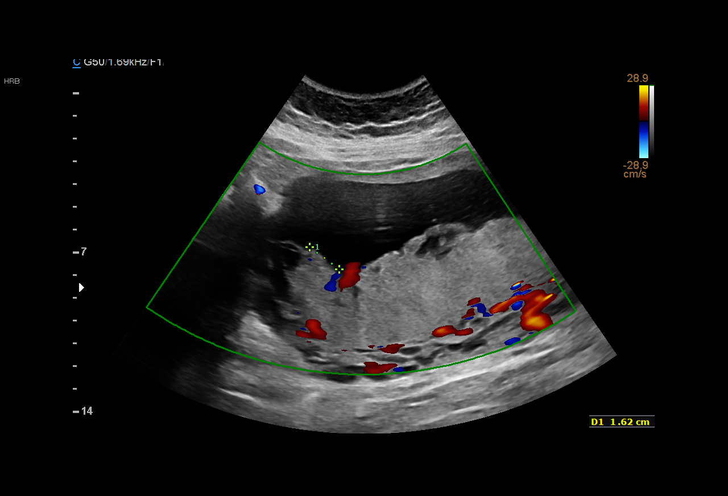
[im 52/82]
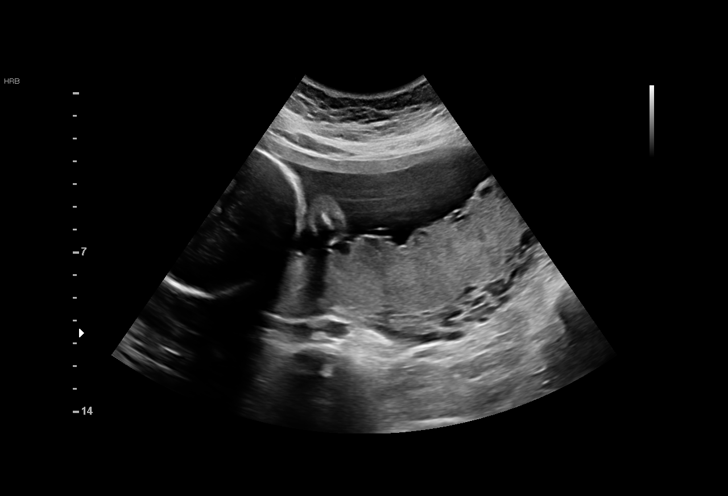
[im 58/82]
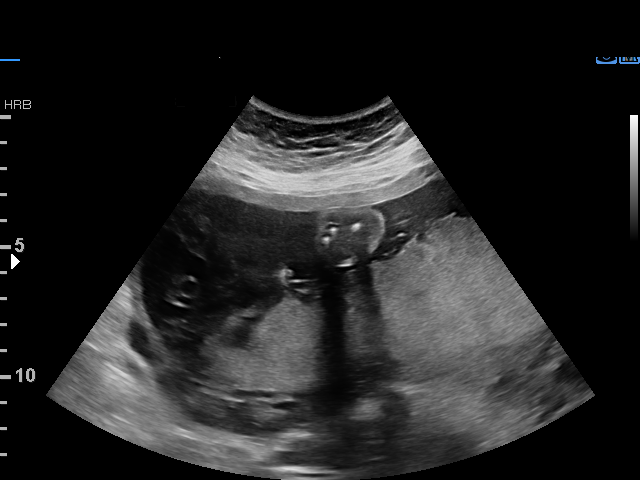
[im 64/82]
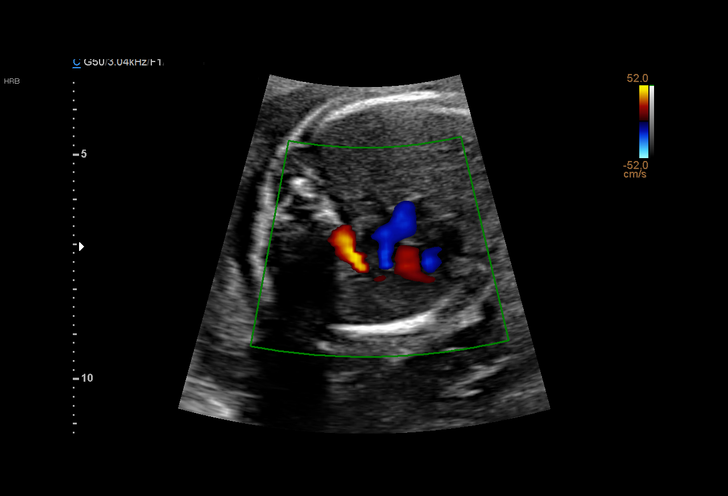
[im 70/82]
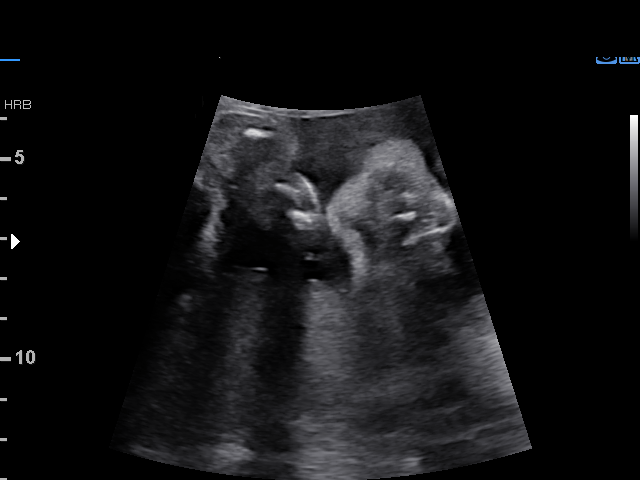
[im 76/82]
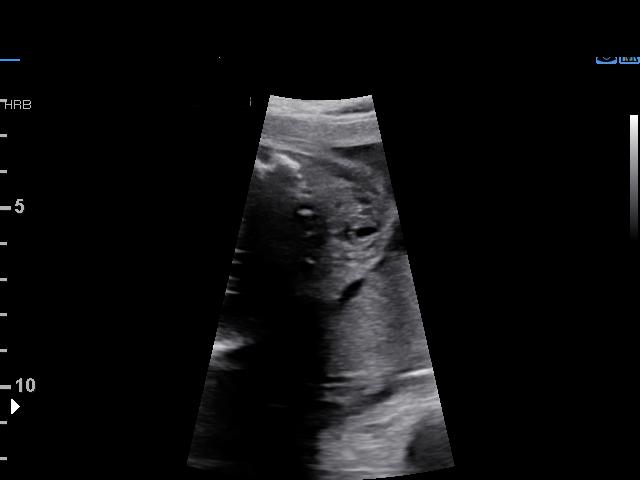
[im 82/82]
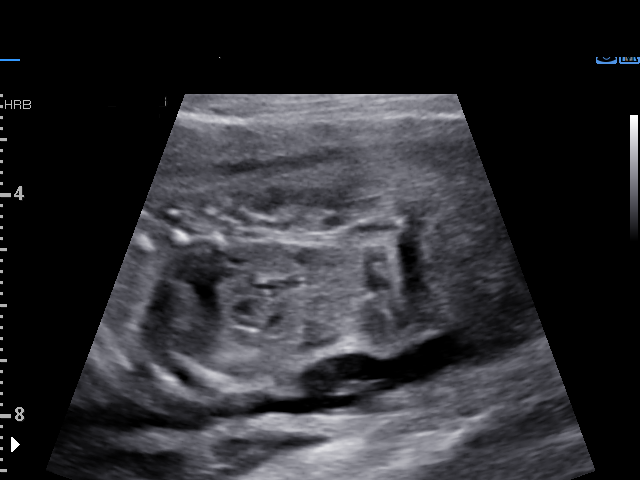

[14 of 28 positions shown; findings below may reference images not displayed]

1  DRAH PUNI               [id number]      [id number]     [id number]
Indications

25 weeks gestation of pregnancy
Encounter for fetal anatomic survey
Fetal abnormality - other known or
suspected (cleft palate)
Obesity complicating pregnancy, second
trimester
OB History

Blood Type:            Height:  5'3"   Weight (lb):  187       BMI:
Gravidity:    2         Term:   1
Living:       1
Fetal Evaluation

Num Of Fetuses:     1
Fetal Heart         143
Rate(bpm):
Cardiac Activity:   Observed
Presentation:       Breech
Placenta:           Posterior, above cervical os
P. Cord Insertion:  Visualized, central

Amniotic Fluid
AFI FV:      Subjectively within normal limits

Largest Pocket(cm)
5.37
Biometry

BPD:      61.3  mm     G. Age:  24w 6d         24  %    CI:        79.37   %    70 - 86
FL/HC:       21.5  %    18.7 -
HC:      217.5  mm     G. Age:  23w 5d        < 3  %    HC/AC:       0.97       1.04 -
AC:      223.8  mm     G. Age:  26w 5d         80  %    FL/BPD:      76.3  %    71 - 87
FL:       46.8  mm     G. Age:  25w 4d         42  %    FL/AC:       20.9  %    20 - 24
HUM:      39.5  mm     G. Age:  24w 1d         12  %
CER:      26.6  mm     G. Age:  24w 2d         23  %

CM:        5.4  mm
Est. FW:     874   gm    1 lb 15 oz     64  %
Gestational Age

U/S Today:     25w 2d                                        EDD:   03/04/17
Best:          25w 3d     Det. By:  Early Ultrasound         EDD:   03/03/17
(08/28/16)
Anatomy

Cranium:               Appears normal         LVOT:                   Appears normal
Cavum:                 Appears normal         Aortic Arch:            Not well visualized
Ventricles:            Appears normal         Ductal Arch:            Appears normal
Choroid Plexus:        Appears normal         Diaphragm:              Appears normal
Cerebellum:            Appears normal         Stomach:                Appears normal, left
sided
Posterior Fossa:       Appears normal         Abdomen:                Appears normal
Nuchal Fold:           Not applicable (>20    Abdominal Wall:         Appears nml (cord
wks GA)                                        insert, abd wall)
Face:                  Orbits appear          Cord Vessels:           Appears normal (3
normal, profile wnl                            vessel cord)
Lips:                  Appears normal         Kidneys:                Appear normal
Palate:                Cleft palate           Bladder:                Appears normal
suspected
Thoracic:              Appears normal         Spine:                  Appears normal
Heart:                 Appears normal         Upper Extremities:      Appears normal
(4CH, axis, and situs
RVOT:                  Appears normal         Lower Extremities:      Appears normal

Other:  Technically difficult due to maternal habitus and fetal position.
Impression

SIUP at 25+3 weeks
Large, apparantly isolated cleft palate; intact upper lip
All other detailed fetal anatomy was seen and appeared
normal; limited views of AA
Normal amniotic fluid volume
Measurements consistent with early US; EFW at the 64th
%tile

The US findings were shared with Ms. Rj.  The
implications of an isolated cleft palate were discussed in
detail.  Ms. Kariesha and her father then met with Nimer
Cheng, our genetic counselor. All of her prenatal testing and
pregnancy management options were reviewed. After careful
consideration, she declined amniocentesis but decided to
have cell free DNA via materniT Genome.
Recommendations

Follow-up ultrasound in 6 weeks to reassess fetal anatomy
Consultation with Dr. Jellisa Abril at [facility] to be arranged

## 2016-11-21 NOTE — Progress Notes (Addendum)
Genetic Counseling  Visit Summary Note  Appointment Date: 11/21/2016 Referred By: Sanjuana Kava  Date of Birth: May 06, 1990  Pregnancy history: G2P0101 Estimated Date of Delivery: 03/03/17 Estimated Gestational Age: [redacted]w[redacted]d I met with Mrs. KSetareh Romfor genetic counseling because of abnormal ultrasound findings. Ms. Gentry's father also attended the genetic counseling appointment today.  In summary:  Discussed ultrasound finding in detail  Cleft palate  Reviewed options for additional screening  NIPS-materniT Genome performed today  Ongoing ultrasound  Pediatric surgery consult will be coordinated  Reviewed options for diagnostic testing, including risks, benefits, limitations and alternatives  Amniocentesis-declined  Reviewed other explanations for ultrasound findings  Reviewed family history concerns  Ms. Collymore was sent for ultrasound and consultation today regarding abnormal findings visualized by ultrasound at the referring provider's office.  A detailed ultrasound was performed today and revealed an apparently isolated cleft palate (CP). There was no evidence of a cleft lip. The remainder of the fetal anatomy was well visualized and there were no other anomalies or markers for aneuploidy. The ultrasound report will be documented separately.   Ms. HHemricwas counseled that CP occurs in approximately 1 in 2500 newborns due to incomplete fusion of the palatal shelves early during fetal development (~[redacted] weeks gestation).  We reviewed that when this closure is not complete, cleft palate involving the hard palate, soft palate, and/or uvula can result.  We discussed that cleft palate alone is thought to represent a distinct and separate entity from cleft lip and cleft palate occurring together.   We reviewed the causes of CP, which can generally be divided into two categories: nonsyndromic causes and syndromic causes. She was counseled that the majority of  cases of isolated cleft palate (no other birth defects or medical conditions are seen in the person) are considered to be nonsyndromic.  Conversely, a small percentage of cases of CP are due to an underlying genetic syndrome and are typically associated with other anatomic differences in the baby.   Most commonly, CP is considered to be multifactorial in etiology, meaning that both genetic and environmental factors are involved.  While researchers have discovered many genes and environmental influences which increase the likelihood of CP, specific genetic testing is not available for nonsyndromic CP.  In the case of multifactorial inheritance, there is typically nothing that either parent did to cause the CP and nothing that they could have done to have prevented it.  We discussed that studies suggest that taking folic acid periconceptionally can reduce the occurrence of CP. Thus, it is recommended that women who have had a previous child with CP, or who have CP themselves take 447mof folic acid prior to conception and throughout the first 3 months of pregnancy.  We discussed that this amount is 10X the amount usually recommended for women with no prior history of orofacial clefting (0.103m36m Ms. HufZooks encouraged to contact her physician regarding this recommendation for future pregnancies.    We reviewed the features of common syndromes known to be associated with CP including Stickler syndrome and 22q11 deletion syndrome.  The family histories were reviewed in detail and were not found to be concerning for either of these conditions.  We reviewed chromosomes, genes, and common inheritance patterns.  She was then counseled regarding the availability of amniocentesis (at 15+ weeks gestation) including the associated risks, benefits, and limitations.  She understands that chromosome analysis and specific single gene analysis (if a specific condition is suspected, such as Stickler syndrome) can be  performed both prenatally (amniotic fluid) and postnatally (peripheral blood or cord blood).  Additionally, we discussed the availability of microarray analysis, which can also be performed pre and postnatally.  Ms. Gorden was counseled that microarray analysis is a molecular based technique in which a test sample of DNA (fetal) is compared to a reference (normal) genome in order to determine if the test sample has any extra or missing genetic information.  Microarray analysis allows for the detection of genetic deletions, including 22q11 deletion syndrome, and duplications that are 161 times smaller than those identified by routine chromosome analysis.  We discussed that recent publications show that approximately 6% of patients with an abnormal fetal ultrasound and a normal fetal karyotype had a significant microdeletion/microduplication detected by prenatal microarray analysis.  They were then counseled regarding the option of noninvasive prenatal screening (NIPS).  We reviewed that this technology evaluates fragments of fetal (placental) DNA found in maternal circulation to predict the chance of specific chromosome conditions in the fetus.  Although highly specific and sensitive, this testing is not considered diagnostic.  We reviewed that NIPS specifically assesses the risk of fetal Down syndrome, trisomy 48, trisomy 74, fetal sex chromosome aneuploidy, triploidy, and select microdeletions (22q11 deletion syndrome); currently, this technology cannot assess the risk for all chromosome aberrations or single gene conditions. We discussed the possible results that the tests might provide including: positive, negative, unanticipated, and no result. Finally, she was counseled regarding the cost of each option and potential out of pocket expenses.  After thoughtful consideration of the options, Ms. Giammarino elected to have NIPS (MaterniTGenome).  She declined amniocentesis for chromosome and single gene  analyses.  Finally, we discussed that CP can be due to an environmental exposure, including alcohol and certain prescription and illicit drugs. Ms. Olvera denied the use of illicit substances. 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 We discussed that Anette Guarneri is a relatively new medication and that there is very limited human teratogenic data available.    This couple was counseled regarding the availability of consultation with a pediatric plastic surgeon for discussion of postnatal treatment and management recommendations. I will coordinate scheduling this appointment and notify the patient.   Both family histories were reviewed and found to be contributory for the patient's maternal aunt having an ONTD that was diagnosed postnatally. She died shortly after birth. We discussed that most ONTDs occur due to multifactorial etiology and are associated with a low risk of recurrence for distant relatives. If however, this relative had an underlying genetic cause, the risk of recurrence could be higher. There was no evidence of an ONTD by ultrasound today.   In addition, this patient reported that her paternal first cousin once-removed was born with a CP. This relative is ~24 years of age and has no other known health concerns. We discussed that if this individual has an isolated, nonsyndromic CP, the risk of recurrence for future pregnancies could be further increased; if this relative has a syndromic cause for the clefting, this may provide insight into whether or not Ms. Bettes's fetus has a genetic cause for the orofacial clefting. Without further information regarding the provided family history, an accurate genetic risk cannot be calculated.   Further genetic counseling is warranted if more information is obtained.  Ms. Trentham reported that her paternal first cousin has autism. We discussed that autism is part of the spectrum of conditions referred to as  Autistic spectrum disorders (ASD).  We discussed that  ASDs are among the most common neurodevelopmental disorders, with approximately 1 in 110 children meeting criteria for ASD.  Approximately 80% of individuals diagnosed are female.  There is strong evidence that genetic factors play a critical role in development of ASD.  There have been recent advances in identifying specific genetic causes of ASD, however, there are still many individuals for whom the etiology of the ASD is not known.  Based on what we know about this family, there should be an approximate 2-3% recurrence chance for this pregnancy. She understands that at this time there is not genetic testing available for ASD for most families.  Ms. Notaro was provided with written information regarding cystic fibrosis (CF), spinal muscular atrophy (SMA) and hemoglobinopathies including the carrier frequency, availability of carrier screening and prenatal diagnosis if indicated.  In addition, we discussed that CF and hemoglobinopathies are routinely screened for as part of the [place] newborn screening panel.  After further discussion, she declined screening for CF, SMA and hemoglobinopathies.  I counseled this patient regarding the above risks and available options.  The approximate face-to-face time with the genetic counselor was 43 minutes.  Sharyne Richters, MS Certified Genetic Counselor

## 2016-11-22 ENCOUNTER — Other Ambulatory Visit (HOSPITAL_COMMUNITY): Payer: Self-pay | Admitting: *Deleted

## 2016-11-22 DIAGNOSIS — Q359 Cleft palate, unspecified: Secondary | ICD-10-CM

## 2016-11-30 ENCOUNTER — Other Ambulatory Visit: Payer: Self-pay

## 2016-11-30 ENCOUNTER — Telehealth (HOSPITAL_COMMUNITY): Payer: Self-pay

## 2016-11-30 NOTE — Telephone Encounter (Signed)
Called Christy Bell to discuss her prenatal cell free DNA test results.  Ms. Christy Bell had MaterniT Genome testing through [facility].  Testing was offered because of an abnormal ultrasound finding (cleft palate).  The patient was identified by name and DOB.  We reviewed that these are screen negative for fetal aneuploidy, 7 MB deletions or duplications across the placental/fetal genome, and select microdeletions (including 22q 11 deletion syndrome) We reviewed the sensitivity and specificity of the testing. She understands that this testing does not identify all chromosome conditions or single gene conditions. A request for a pediatric plastic surgery consult was sent to Waymon BudgeJoan Kull in Dr. Eliezer MccoyLisa David's office today. She will contact the patient with her appt time and date. All questions were answered to her satisfaction, she was encouraged to call with additional questions or concerns.  Despina AriasSTANLEY, Quirino Kakos, MS Certified Genetic Counselor

## 2016-12-11 NOTE — Addendum Note (Signed)
Encounter addended by: Despina AriasStanley, Danyia Borunda on: 12/11/2016 8:23 AM  Actions taken: Sign clinical note

## 2017-01-02 ENCOUNTER — Encounter: Payer: Medicaid Other | Attending: Obstetrics & Gynecology | Admitting: Registered"

## 2017-01-02 ENCOUNTER — Ambulatory Visit (HOSPITAL_COMMUNITY): Admission: RE | Admit: 2017-01-02 | Payer: Medicaid Other | Source: Ambulatory Visit

## 2017-01-02 DIAGNOSIS — Z3A00 Weeks of gestation of pregnancy not specified: Secondary | ICD-10-CM | POA: Insufficient documentation

## 2017-01-02 DIAGNOSIS — O24419 Gestational diabetes mellitus in pregnancy, unspecified control: Secondary | ICD-10-CM | POA: Insufficient documentation

## 2017-01-02 DIAGNOSIS — Z713 Dietary counseling and surveillance: Secondary | ICD-10-CM | POA: Insufficient documentation

## 2017-01-02 DIAGNOSIS — R7309 Other abnormal glucose: Secondary | ICD-10-CM

## 2017-01-03 ENCOUNTER — Encounter: Payer: Self-pay | Admitting: Registered"

## 2017-01-03 DIAGNOSIS — R7309 Other abnormal glucose: Secondary | ICD-10-CM | POA: Insufficient documentation

## 2017-01-03 NOTE — Progress Notes (Signed)
Patient was seen on 01/02/2017 for Gestational Diabetes self-management class at the [facility]. The following learning objectives were met by the patient during this course:   States the definition of Gestational Diabetes  States why dietary management is important in controlling blood glucose  Describes the effects each nutrient has on blood glucose levels  Demonstrates ability to create a balanced meal plan  Demonstrates carbohydrate counting   States when to check blood glucose levels  Demonstrates proper blood glucose monitoring techniques  States the effect of stress and exercise on blood glucose levels  States the importance of limiting caffeine and abstaining from alcohol and smoking  Blood glucose monitor given: Accu-Chek Guide Lot # [id number] Exp: 03/31/2018 Blood glucose reading: 79  Patient instructed to monitor glucose levels: FBS: 60 - <95 1 hour: <140 2 hour: <120  Patient received handouts:  Nutrition Diabetes and Pregnancy  Carbohydrate Counting List  Patient will be seen for follow-up as needed.

## 2017-01-05 ENCOUNTER — Inpatient Hospital Stay (HOSPITAL_COMMUNITY)
Admission: AD | Admit: 2017-01-05 | Discharge: 2017-01-05 | Disposition: A | Discharge status: Home / Self Care | Payer: Medicaid Other | Source: Ambulatory Visit | Attending: Obstetrics and Gynecology | Admitting: Obstetrics and Gynecology

## 2017-01-05 ENCOUNTER — Other Ambulatory Visit: Payer: Self-pay

## 2017-01-05 ENCOUNTER — Encounter (HOSPITAL_COMMUNITY): Payer: Self-pay | Admitting: *Deleted

## 2017-01-05 DIAGNOSIS — W19XXXA Unspecified fall, initial encounter: Secondary | ICD-10-CM | POA: Insufficient documentation

## 2017-01-05 DIAGNOSIS — O99343 Other mental disorders complicating pregnancy, third trimester: Secondary | ICD-10-CM | POA: Insufficient documentation

## 2017-01-05 DIAGNOSIS — O26893 Other specified pregnancy related conditions, third trimester: Secondary | ICD-10-CM | POA: Diagnosis present

## 2017-01-05 DIAGNOSIS — Z87891 Personal history of nicotine dependence: Secondary | ICD-10-CM | POA: Diagnosis not present

## 2017-01-05 DIAGNOSIS — R42 Dizziness and giddiness: Secondary | ICD-10-CM | POA: Insufficient documentation

## 2017-01-05 DIAGNOSIS — J45909 Unspecified asthma, uncomplicated: Secondary | ICD-10-CM | POA: Diagnosis not present

## 2017-01-05 DIAGNOSIS — Z3A31 31 weeks gestation of pregnancy: Secondary | ICD-10-CM | POA: Insufficient documentation

## 2017-01-05 DIAGNOSIS — O219 Vomiting of pregnancy, unspecified: Secondary | ICD-10-CM

## 2017-01-05 DIAGNOSIS — O99513 Diseases of the respiratory system complicating pregnancy, third trimester: Secondary | ICD-10-CM | POA: Insufficient documentation

## 2017-01-05 DIAGNOSIS — O24410 Gestational diabetes mellitus in pregnancy, diet controlled: Secondary | ICD-10-CM | POA: Insufficient documentation

## 2017-01-05 DIAGNOSIS — F329 Major depressive disorder, single episode, unspecified: Secondary | ICD-10-CM | POA: Insufficient documentation

## 2017-01-05 DIAGNOSIS — O35AXX0 Maternal care for other (suspected) fetal abnormality and damage, fetal facial anomalies, not applicable or unspecified: Secondary | ICD-10-CM | POA: Insufficient documentation

## 2017-01-05 DIAGNOSIS — R112 Nausea with vomiting, unspecified: Secondary | ICD-10-CM

## 2017-01-05 LAB — URINALYSIS, ROUTINE W REFLEX MICROSCOPIC
Bilirubin Urine: NEGATIVE
Glucose, UA: NEGATIVE mg/dL
Hgb urine dipstick: NEGATIVE
KETONES UR: NEGATIVE mg/dL
LEUKOCYTES UA: NEGATIVE
NITRITE: NEGATIVE
PROTEIN: NEGATIVE mg/dL
Specific Gravity, Urine: 1.004 — ABNORMAL LOW (ref 1.005–1.030)
pH: 7 (ref 5.0–8.0)

## 2017-01-05 LAB — GLUCOSE, CAPILLARY: Glucose-Capillary: 82 mg/dL (ref 65–99)

## 2017-01-05 MED ORDER — ONDANSETRON HCL 4 MG PO TABS: 4.0000 mg | ORAL_TABLET | Freq: Four times a day (QID) | ORAL | 0 refills | Status: DC

## 2017-01-05 MED ORDER — ONDANSETRON 8 MG PO TBDP
8.0000 mg | ORAL_TABLET | Freq: Once | ORAL | Status: AC
  Administered 2017-01-05: 8 mg via ORAL
  Filled 2017-01-05: qty 1

## 2017-01-05 NOTE — MAU Note (Signed)
Pt reports vomiting x1 today after eating. Her CBG was 104 when she arrived at home. She has had dizzy spells throughout the week.

## 2017-01-05 NOTE — Discharge Instructions (Signed)
Nausea, Adult Feeling sick to your stomach (nausea) means that your stomach is upset or you feel like you have to throw up (vomit). Feeling sick to your stomach is usually not serious, but it may be an early sign of a more serious medical problem. As you feel sicker to your stomach, it can lead to throwing up (vomiting). If you throw up, or if you are not able to drink enough fluids, there is a risk of dehydration. Dehydration can make you feel tired and thirsty, have a dry mouth, and pee (urinate) less often. Older adults and people who have other diseases or a weak defense (immune) system have a higher risk of dehydration. The main goal of treating this condition is to:  Limit how often you feel sick to your stomach.  Prevent throwing up and dehydration.  Follow these instructions at home: Follow instructions from your doctor about how to care for yourself at home. Eating and drinking Follow these recommendations as told by your doctor:  Take an oral rehydration solution (ORS). This is a drink that is sold at pharmacies and stores.  Drink clear fluids in small amounts as you are able, such as: ? Water. ? Ice chips. ? Fruit juice that has water added (diluted fruit juice). ? Low-calorie sports drinks.  Eat bland, easy to digest foods in small amounts as you are able, such as: ? Bananas. ? Applesauce. ? Rice. ? Lean meats. ? Toast. ? Crackers.  Avoid drinking fluids that contain a lot of sugar or caffeine.  Avoid alcohol.  Avoid spicy or fatty foods.  General instructions  Drink enough fluid to keep your pee (urine) clear or pale yellow.  Wash your hands often. If you cannot use soap and water, use hand sanitizer.  Make sure that all people in your household wash their hands well and often.  Rest at home while you get better.  Take over-the-counter and prescription medicines only as told by your doctor.  Breathe slowly and deeply when you feel sick to your  stomach.  Watch your condition for any changes.  Keep all follow-up visits as told by your doctor. This is important. Contact a doctor if:  You have a headache.  You have new symptoms.  You feel sicker to your stomach.  You have a fever.  You feel light-headed or dizzy.  You throw up.  You are not able to keep fluids down. Get help right away if:  You have pain in your chest, neck, arm, or jaw.  You feel very weak or you pass out (faint).  You have throw up that is bright red or looks like coffee grounds.  You have bloody or black poop (stools), or poop that looks like tar.  You have a very bad headache, a stiff neck, or both.  You have very bad pain, cramping, or bloating in your belly.  You have a rash.  You have trouble breathing or you are breathing very quickly.  Your heart is beating very quickly.  Your skin feels cold and clammy.  You feel confused.  You have pain while peeing.  You have signs of dehydration, such as: ? Dark pee, or very little or no pee. ? Cracked lips. ? Dry mouth. ? Sunken eyes. ? Sleepiness. ? Weakness. These symptoms may be an emergency. Do not wait to see if the symptoms will go away. Get medical help right away. Call your local emergency services (911 in the [place] Do not drive yourself to   the hospital. This information is not intended to replace advice given to you by your health care provider. Make sure you discuss any questions you have with your health care provider. Document Released: 01/11/2011 Document Revised: 06/30/2015 Document Reviewed: 09/28/2014 Elsevier Interactive Patient Education  2018 Elsevier Inc.  

## 2017-01-05 NOTE — MAU Note (Signed)
Just got back from dinner, had to pull off side of road, felt hot and vomit.  Been dizzy x 2 days.  Baby moving well. Has Gestational Dm and takes blood sugars but not on any diabetic meds. No bleeding. No leaking.

## 2017-01-05 NOTE — MAU Provider Note (Signed)
History   G2P0101 @ 31.6 wks in wiyh nausea and states vomited x 1 today. Pt is GDM diet controled. Diet review pt only had tortellini soups today. Denies abd pain, vag bleeding or ROM. Pt states has not felt good for acouple of days wondering if she has virus that is going around.   CSN: [id number]  Arrival date & time 01/05/17  [id number]   First Provider Initiated Contact with Patient 01/05/17 1947      Chief Complaint  Patient presents with  . Dizziness  . Nausea    HPI  Past Medical History:  Diagnosis Date  . Asthma   . Depression   . Preeclampsia     Past Surgical History:  Procedure Laterality Date  . CESAREAN SECTION N/A 09/07/2014   Procedure: CESAREAN SECTION;  Surgeon: Essie HartWalda Pinn, MD;  Location: [facility];  Service: Obstetrics;  Laterality: N/A;    History reviewed. No pertinent family history.  Social History   Tobacco Use  . Smoking status: Former Smoker    Last attempt to quit: 02/07/2014    Years since quitting: 2.9  . Smokeless tobacco: Never Used  Substance Use Topics  . Alcohol use: No    Alcohol/week: 0.0 oz  . Drug use: No    OB History    Gravida Para Term Preterm AB Living   2 1   1   1    SAB TAB Ectopic Multiple Live Births         0 1      Review of Systems  Constitutional: Negative.   HENT: Negative.   Eyes: Negative.   Respiratory: Negative.   Cardiovascular: Negative.   Gastrointestinal: Positive for nausea and vomiting.  Endocrine: Negative.   Genitourinary: Negative.   Musculoskeletal: Negative.   Skin: Negative.   Allergic/Immunologic: Negative.   Neurological: Negative.   Hematological: Negative.   Psychiatric/Behavioral: Negative.     Allergies  Patient has no known allergies.  Home Medications    BP 115/63 (BP Location: Right Arm)   Pulse 98   Temp 98.4 F (36.9 C) (Oral)   Resp 16   Ht 5\' 3"  (1.6 m)   Wt 195 lb 8 oz (88.7 kg)   LMP 05/14/2016 (Approximate)   SpO2 98%   BMI 34.63 kg/m   Physical Exam   Constitutional: She is oriented to person, place, and time. She appears well-developed and well-nourished.  HENT:  Head: Normocephalic.  Eyes: Pupils are equal, round, and reactive to light.  Neck: Normal range of motion.  Cardiovascular: Normal rate, regular rhythm, normal heart sounds and intact distal pulses.  Pulmonary/Chest: Effort normal and breath sounds normal.  Abdominal: Soft. Bowel sounds are normal.  Musculoskeletal: Normal range of motion.  Neurological: She is alert and oriented to person, place, and time. She has normal reflexes.  Skin: Skin is warm and dry.  Psychiatric: She has a normal mood and affect. Her behavior is normal. Judgment and thought content normal.    MAU Course  Procedures (including critical care time)  Labs Reviewed  URINALYSIS, ROUTINE W REFLEX MICROSCOPIC - Abnormal; Notable for the following components:      Result Value   Color, Urine STRAW (*)    Specific Gravity, Urine 1.004 (*)    All other components within normal limits   No results found.   1. Fall, initial encounter       MDM  VSS, FHR pattern reassuring, 15x15 accels no decels. No contractions.  20:27 pt states feeling  better from zofran and request d/c home. POC discussed with Dr. Henderson CloudHorvath. Will d/c pt home with rx for zofran.

## 2017-01-08 ENCOUNTER — Other Ambulatory Visit (HOSPITAL_COMMUNITY): Payer: Self-pay | Admitting: Maternal and Fetal Medicine

## 2017-01-08 ENCOUNTER — Ambulatory Visit (HOSPITAL_COMMUNITY)
Admission: RE | Admit: 2017-01-08 | Discharge: 2017-01-08 | Disposition: A | Discharge status: Home / Self Care | Payer: Medicaid Other | Source: Ambulatory Visit | Attending: Maternal and Fetal Medicine | Admitting: Maternal and Fetal Medicine

## 2017-01-08 ENCOUNTER — Encounter (HOSPITAL_COMMUNITY): Payer: Self-pay

## 2017-01-08 DIAGNOSIS — O99213 Obesity complicating pregnancy, third trimester: Secondary | ICD-10-CM

## 2017-01-08 DIAGNOSIS — Q359 Cleft palate, unspecified: Secondary | ICD-10-CM

## 2017-01-08 DIAGNOSIS — Z6833 Body mass index (BMI) 33.0-33.9, adult: Secondary | ICD-10-CM | POA: Diagnosis not present

## 2017-01-08 DIAGNOSIS — O358XX0 Maternal care for other (suspected) fetal abnormality and damage, not applicable or unspecified: Secondary | ICD-10-CM | POA: Diagnosis present

## 2017-01-08 DIAGNOSIS — E669 Obesity, unspecified: Secondary | ICD-10-CM | POA: Diagnosis not present

## 2017-01-08 DIAGNOSIS — Z3A32 32 weeks gestation of pregnancy: Secondary | ICD-10-CM

## 2017-01-08 DIAGNOSIS — W19XXXA Unspecified fall, initial encounter: Secondary | ICD-10-CM

## 2017-01-08 IMAGING — US US MFM OB FOLLOW-UP
1 series · 14 of 28 positions shown · non-contrast
Comparison: none

[Series 1: us mfm ob follow-up · 42 acquisitions, 14 frames shown]
[im 2/42]
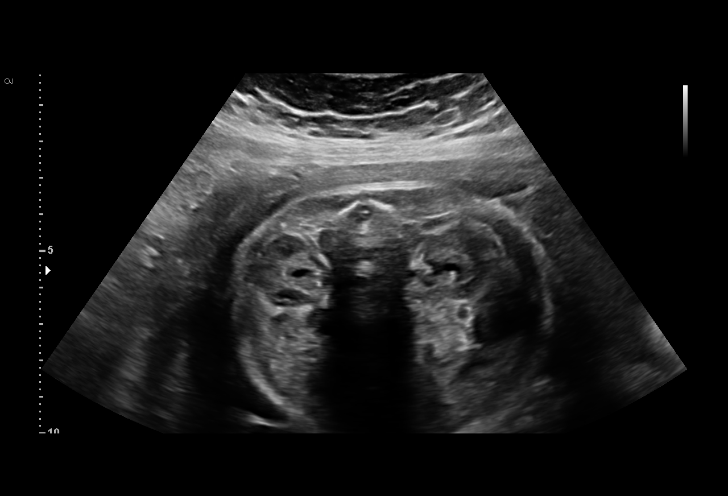
[im 5/42]
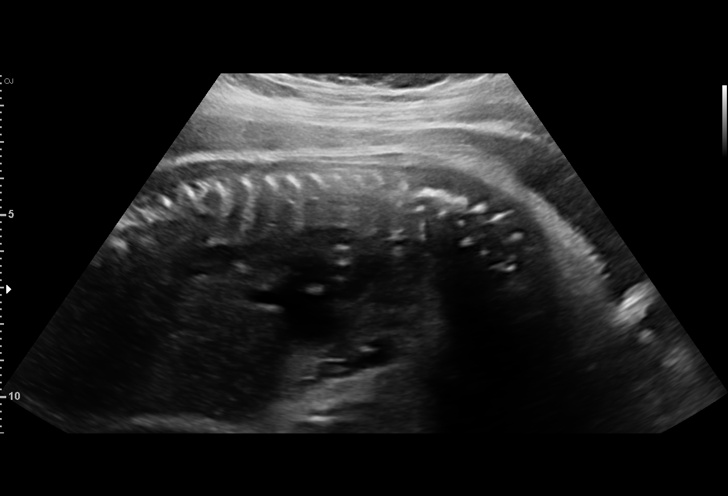
[im 8/42]
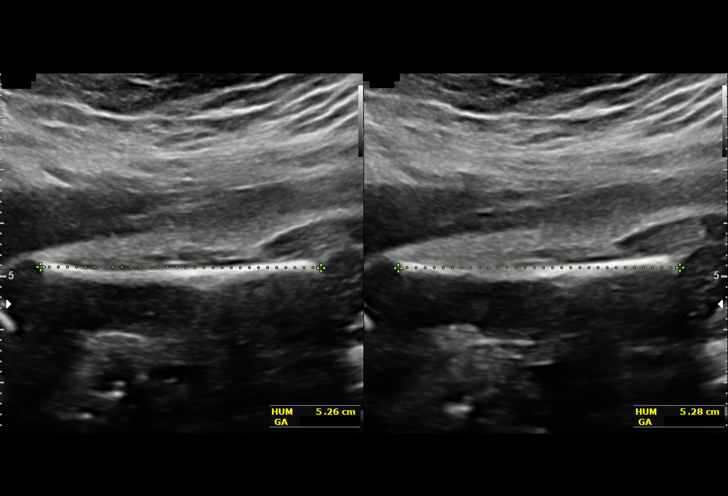
[im 11/42]
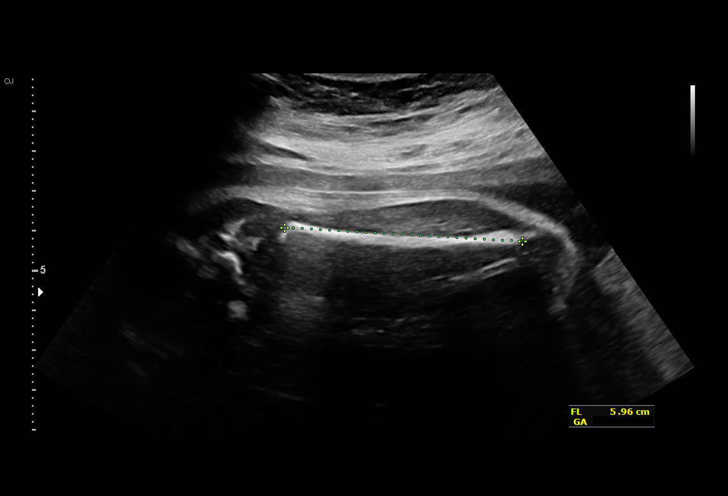
[im 14/42]
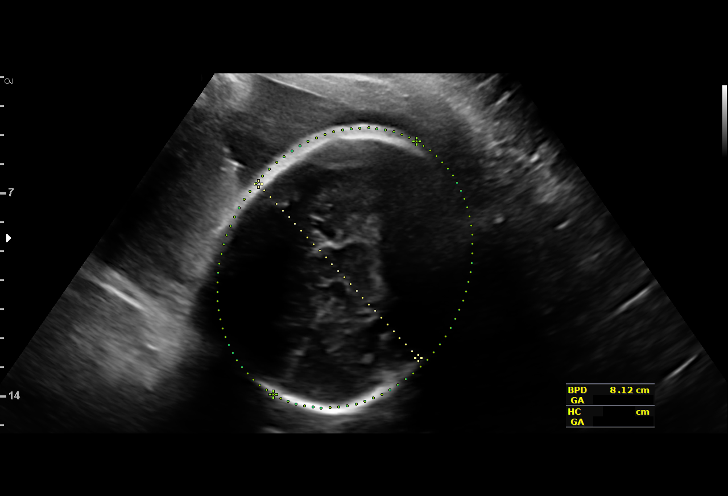
[im 17/42]
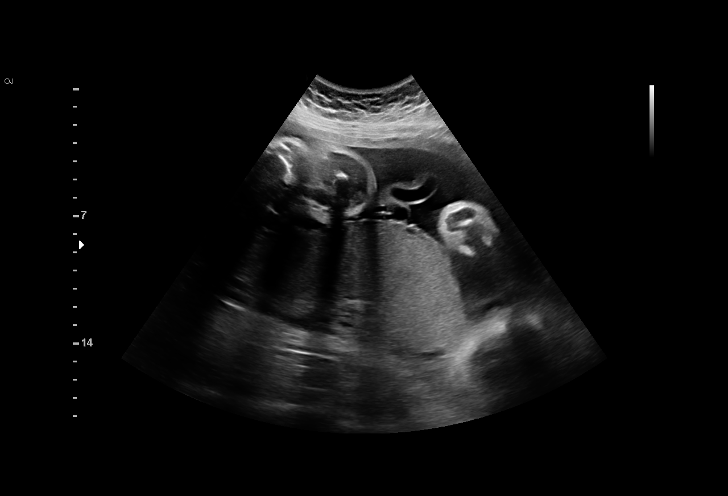
[im 20/42]
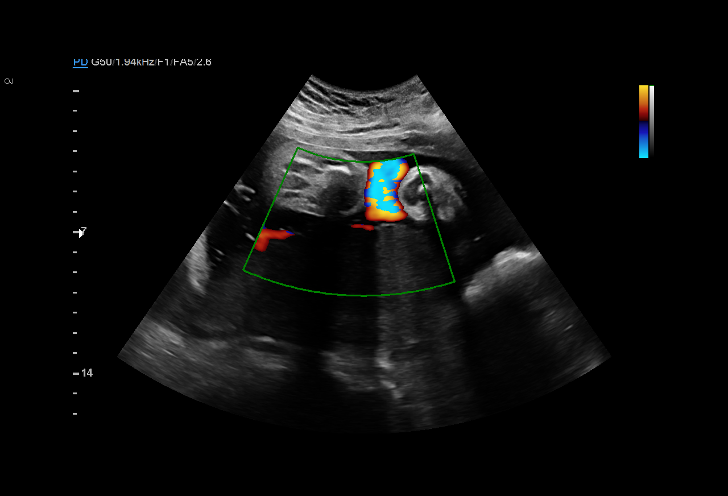
[im 23/42]
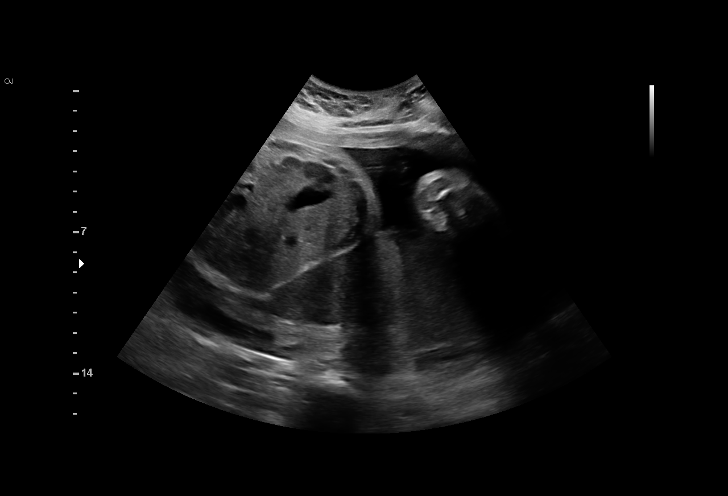
[im 26/42]
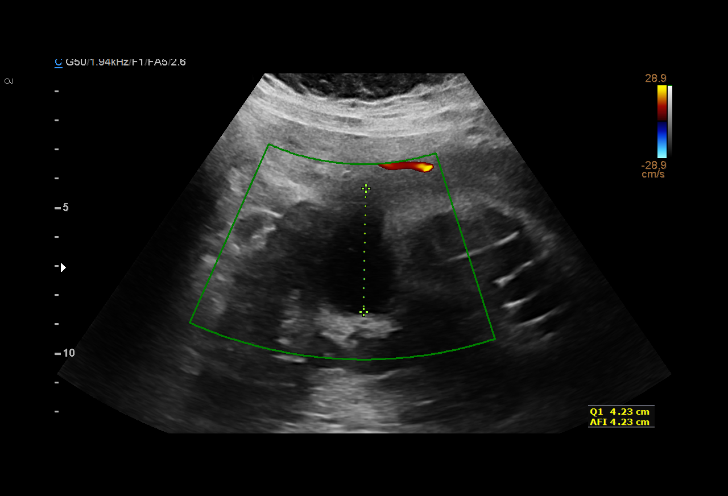
[im 29/42]
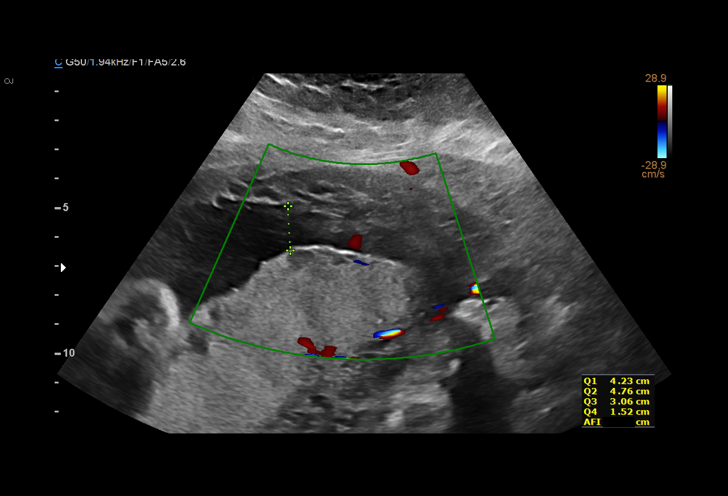
[im 32/42]
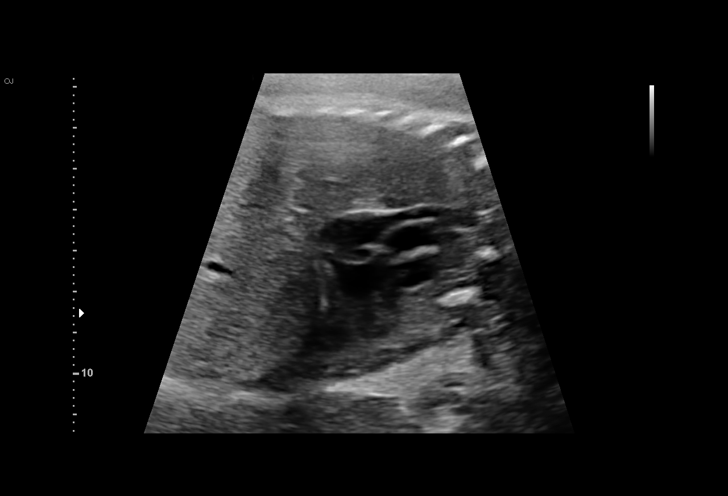
[im 35/42]
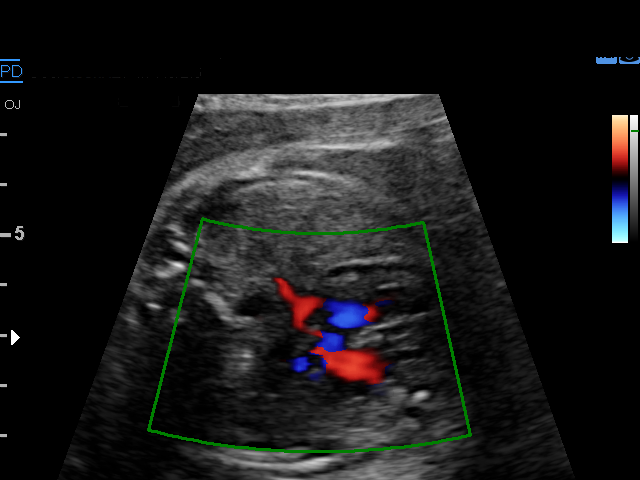
[im 38/42]
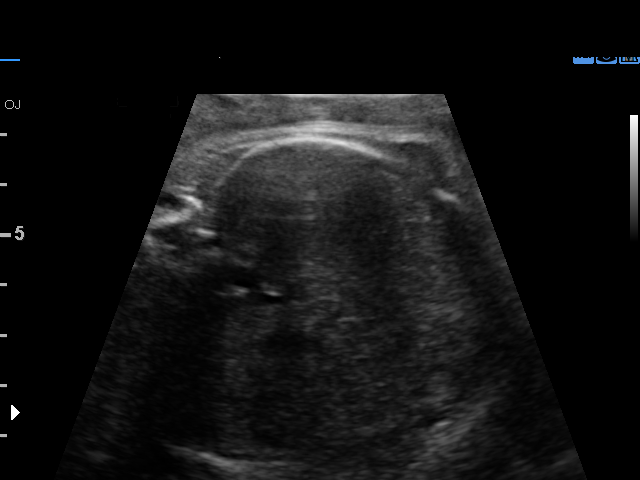
[im 42/42]
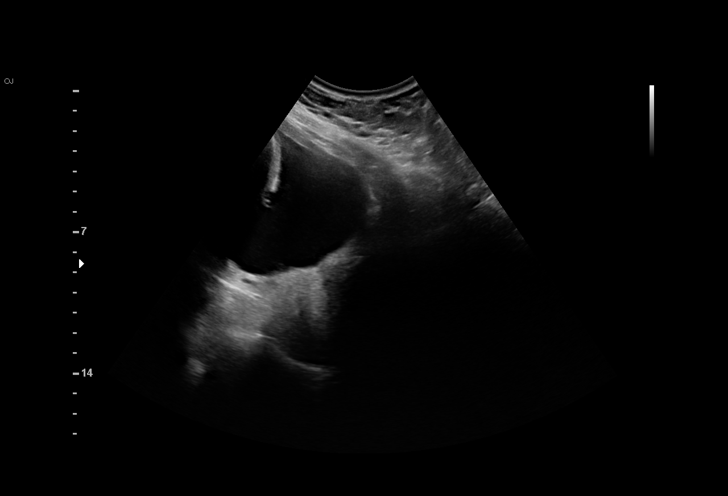

[14 of 28 positions shown; findings below may reference images not displayed]

1  AYAHE ZAEN JONNO MON            [id number]      [id number]     [id number]
Indications

32 weeks gestation of pregnancy
Fetal abnormality - other known or
suspected (cleft palate); S/P plastic surg
consultation
Obesity complicating pregnancy, third
trimester
OB History

Blood Type:            Height:  5'3"   Weight (lb):  187       BMI:
Gravidity:    2         Term:   1
Living:       1
Fetal Evaluation

Num Of Fetuses:     1
Fetal Heart         145
Rate(bpm):
Cardiac Activity:   Observed
Presentation:       Cephalic
Placenta:           Posterior, above cervical os
P. Cord Insertion:  Previously Visualized

Amniotic Fluid
AFI FV:      Subjectively within normal limits

AFI Sum(cm)     %Tile       Largest Pocket(cm)
13.57           44

RUQ(cm)       RLQ(cm)       LUQ(cm)        LLQ(cm)
4.23
Biometry
BPD:      79.6  mm     G. Age:  32w 0d         32  %    CI:        77.13   %    70 - 86
FL/HC:      21.0   %    19.1 -
HC:       287   mm     G. Age:  31w 4d          6  %    HC/AC:      1.01        0.96 -
AC:       283   mm     G. Age:  32w 2d         51  %    FL/BPD:     75.8   %    71 - 87
FL:       60.3  mm     G. Age:  31w 2d         17  %    FL/AC:      21.3   %    20 - 24
HUM:      52.9  mm     G. Age:  30w 6d         23  %

Est. FW:    3660  gm      4 lb 2 oz     50  %
Gestational Age

U/S Today:     31w 6d                                        EDD:   03/06/17
Best:          32w 2d     Det. By:  Early Ultrasound         EDD:   03/03/17
(08/28/16)
Anatomy

Cranium:               Appears normal         LVOT:                   Appears normal
Cavum:                 Appears normal         Aortic Arch:            Appears normal
Ventricles:            Appears normal         Ductal Arch:            Previously seen
Choroid Plexus:        Previously seen        Diaphragm:              Previously seen
Cerebellum:            Previously seen        Stomach:                Appears normal, left
sided
Posterior Fossa:       Previously seen        Abdomen:                Appears normal
Nuchal Fold:           Not applicable (>20    Abdominal Wall:         Previously seen
wks GA)
Face:                  Orbits and profile     Cord Vessels:           Previously seen
previously seen
Lips:                  Appears normal         Kidneys:                Appear normal
Palate:                Cleft palate           Bladder:                Appears normal
suspected
Thoracic:              Appears normal         Spine:                  Previously seen
Heart:                 Previously seen        Upper Extremities:      Previously seen
RVOT:                  Appears normal         Lower Extremities:      Previously seen

Other:  Technically difficult due to maternal habitus and fetal position.
Cervix Uterus Adnexa

Cervix
Not visualized (advanced GA >90wks)

Uterus
No abnormality visualized.

Left Ovary
Not visualized.

Right Ovary
Not visualized.

Cul De Sac:   No free fluid seen.

Adnexa:       No abnormality visualized.
Impression

SIUP at 32+2 weeks
Suspected isolated cleft palate
All other interval fetal anatomy was seen and appeared
normal; anatomic survey complete
Normal amniotic fluid volume
Appropriate interval growth with EFW at the 50th %tile
Recommendations

Suggest another US in 
 4 weeks to check for polyhydramnios
Follow-up ultrasounds at [facility] as clinically indicated.
Please let us know if we can be of further assistance you or
Ms. Makeda

## 2017-02-07 ENCOUNTER — Other Ambulatory Visit: Payer: Self-pay | Admitting: Obstetrics & Gynecology

## 2017-02-11 ENCOUNTER — Encounter (HOSPITAL_COMMUNITY): Payer: Self-pay

## 2017-02-12 ENCOUNTER — Encounter (HOSPITAL_COMMUNITY): Payer: Self-pay

## 2017-02-19 ENCOUNTER — Telehealth (HOSPITAL_COMMUNITY): Payer: Self-pay | Admitting: Lactation Services

## 2017-02-19 NOTE — Telephone Encounter (Signed)
Called mom back at her request. Mom reports infant has a cleft palate with no lip involvement. Infant will be 39 weeks. Mom was told that she would not be able to BF her child due to cleft. Mom does believe she was told that the hard palate is involved.   Discussed with mom that cleft palate babies can BF and that breast milk will be more gentle on the infant if she has back flow into the nasal area, which is common with cleft palate babies. Discussed with mom that it will be important to start pumping with DEBP as soon after delivery as possible. Discussed that cleft palate infants may have difficulty with forming a seal at the breast and will best be determined in her infant after delivery. Discussed Cleft Palate infant's often need to be supplemented due to decreased transfer at the breast, with a special bottle such as a Dr Theora GianottiBrown's special feeder or [employer] feeder. Discussed with mom that I spoke with the NICU SLP, Isabelle CourseLydia today and that Isabelle CourseLydia is aware of her and her infant and will be involved with determining the best was to supplement the infant.   Mom would like a LC at the bedside in the recovery room after delivery. She believes her c/s is supposed to be around 9 am  on 1/22. Discussed that is a very bsy time in the mornings and we will do our best to have a LC in the Recovery room on 1/22 for mom.   Mom reports first baby was a 434 weeker and was in the NICU. She did go on to BF for 6-7 months. Mom reports she had a great milk supply.   [facility] older child [facility] followed.   Offered for mom to start hand expression to store colostrum prior to delivery, mom declined saying her milk came in well with her older infant.   Mom to call back with any further questions/concerns as needed.

## 2017-02-25 ENCOUNTER — Encounter (HOSPITAL_COMMUNITY)
Admission: RE | Admit: 2017-02-25 | Discharge: 2017-02-25 | Disposition: A | Discharge status: Home / Self Care | Payer: Medicaid Other | Source: Ambulatory Visit | Attending: Obstetrics & Gynecology | Admitting: Obstetrics & Gynecology

## 2017-02-25 HISTORY — DX: Gestational diabetes mellitus in pregnancy, unspecified control: O24.419

## 2017-02-25 HISTORY — DX: Personal history of other infectious and parasitic diseases: Z86.19

## 2017-02-25 LAB — CBC
HEMATOCRIT: 33.2 % — AB (ref 36.0–46.0)
Hemoglobin: 10.4 g/dL — ABNORMAL LOW (ref 12.0–15.0)
MCH: 25.9 pg — AB (ref 26.0–34.0)
MCHC: 31.3 g/dL (ref 30.0–36.0)
MCV: 82.8 fL (ref 78.0–100.0)
Platelets: 215 10*3/uL (ref 150–400)
RBC: 4.01 MIL/uL (ref 3.87–5.11)
RDW: 18.7 % — ABNORMAL HIGH (ref 11.5–15.5)
WBC: 5.2 10*3/uL (ref 4.0–10.5)

## 2017-02-25 LAB — TYPE AND SCREEN
ABO/RH(D): A POS
ANTIBODY SCREEN: NEGATIVE

## 2017-02-25 NOTE — Patient Instructions (Addendum)
Christy Bell  02/25/2017   Your procedure is scheduled on:  02/26/2017  Enter through the Main Entrance of [facility] at 0750 AM.  Pick up the phone at the desk and dial [phone]  Call this number if you have problems the morning of surgery:[phone]  Remember:   Do not eat food:After Midnight.  Do not drink clear liquids: After Midnight.  Take these medicines the morning of surgery with A SIP OF WATER: take zoloft as normal   Do not wear jewelry, make-up or nail polish.  Do not wear lotions, powders, or perfumes. Do not wear deodorant.  Do not shave 48 hours prior to surgery.  Do not bring valuables to the hospital.  [facility] is not   responsible for any belongings or valuables brought to the hospital.  Contacts, dentures or bridgework may not be worn into surgery.  Leave suitcase in the car. After surgery it may be brought to your room.  For patients admitted to the hospital, checkout time is 11:00 AM the day of              discharge.    N/A   Please read over the following fact sheets that you were given:   Surgical Site Infection Prevention

## 2017-02-26 ENCOUNTER — Encounter (HOSPITAL_COMMUNITY)
Admission: RE | Disposition: A | Discharge status: Home / Self Care | Payer: Self-pay | Source: Ambulatory Visit | Attending: Obstetrics & Gynecology

## 2017-02-26 ENCOUNTER — Inpatient Hospital Stay (HOSPITAL_COMMUNITY): Payer: Medicaid Other | Admitting: Anesthesiology

## 2017-02-26 ENCOUNTER — Encounter (HOSPITAL_COMMUNITY): Payer: Self-pay | Admitting: Anesthesiology

## 2017-02-26 ENCOUNTER — Inpatient Hospital Stay (HOSPITAL_COMMUNITY)
Admission: RE | Admit: 2017-02-26 | Discharge: 2017-02-28 | DRG: 787 | Disposition: A | Discharge status: Home / Self Care | Payer: Medicaid Other | Source: Ambulatory Visit | Attending: Obstetrics & Gynecology | Admitting: Obstetrics & Gynecology

## 2017-02-26 DIAGNOSIS — O9081 Anemia of the puerperium: Secondary | ICD-10-CM | POA: Diagnosis not present

## 2017-02-26 DIAGNOSIS — O34211 Maternal care for low transverse scar from previous cesarean delivery: Principal | ICD-10-CM | POA: Diagnosis present

## 2017-02-26 DIAGNOSIS — Z3A39 39 weeks gestation of pregnancy: Secondary | ICD-10-CM | POA: Diagnosis not present

## 2017-02-26 DIAGNOSIS — K219 Gastro-esophageal reflux disease without esophagitis: Secondary | ICD-10-CM | POA: Diagnosis present

## 2017-02-26 DIAGNOSIS — D62 Acute posthemorrhagic anemia: Secondary | ICD-10-CM | POA: Diagnosis not present

## 2017-02-26 DIAGNOSIS — Z98891 History of uterine scar from previous surgery: Secondary | ICD-10-CM

## 2017-02-26 DIAGNOSIS — O9962 Diseases of the digestive system complicating childbirth: Secondary | ICD-10-CM | POA: Diagnosis present

## 2017-02-26 DIAGNOSIS — Z87891 Personal history of nicotine dependence: Secondary | ICD-10-CM

## 2017-02-26 DIAGNOSIS — Z23 Encounter for immunization: Secondary | ICD-10-CM

## 2017-02-26 DIAGNOSIS — O358XX0 Maternal care for other (suspected) fetal abnormality and damage, not applicable or unspecified: Secondary | ICD-10-CM | POA: Diagnosis present

## 2017-02-26 DIAGNOSIS — O24420 Gestational diabetes mellitus in childbirth, diet controlled: Secondary | ICD-10-CM | POA: Diagnosis present

## 2017-02-26 LAB — GLUCOSE, CAPILLARY
GLUCOSE-CAPILLARY: 89 mg/dL (ref 65–99)
Glucose-Capillary: 91 mg/dL (ref 65–99)

## 2017-02-26 LAB — RPR: RPR: NONREACTIVE

## 2017-02-26 SURGERY — Surgical Case
Anesthesia: Spinal | Site: Abdomen

## 2017-02-26 MED ORDER — DIBUCAINE 1 % RE OINT: 1.0000 "application " | TOPICAL_OINTMENT | RECTAL | Status: DC | PRN

## 2017-02-26 MED ORDER — IBUPROFEN 100 MG/5ML PO SUSP
600.0000 mg | Freq: Four times a day (QID) | ORAL | Status: DC
  Administered 2017-02-26 – 2017-02-28 (×8): 600 mg via ORAL
  Filled 2017-02-26 (×8): qty 30

## 2017-02-26 MED ORDER — LACTATED RINGERS IV SOLN
INTRAVENOUS | Status: DC
  Administered 2017-02-26 (×2): via INTRAVENOUS

## 2017-02-26 MED ORDER — SIMETHICONE 80 MG PO CHEW
80.0000 mg | CHEWABLE_TABLET | ORAL | Status: DC
  Administered 2017-02-26 – 2017-02-27 (×2): 80 mg via ORAL
  Filled 2017-02-26 (×2): qty 1

## 2017-02-26 MED ORDER — FENTANYL CITRATE (PF) 100 MCG/2ML IJ SOLN: INTRAMUSCULAR | Status: DC | PRN

## 2017-02-26 MED ORDER — HYDROMORPHONE HCL 1 MG/ML IJ SOLN: 0.2500 mg | INTRAMUSCULAR | Status: DC | PRN

## 2017-02-26 MED ORDER — 0.9 % SODIUM CHLORIDE (POUR BTL) OPTIME: TOPICAL | Status: DC | PRN

## 2017-02-26 MED ORDER — COMPLETENATE 29-1 MG PO CHEW
1.0000 | CHEWABLE_TABLET | Freq: Every day | ORAL | Status: DC
  Administered 2017-02-27 – 2017-02-28 (×2): 1 via ORAL
  Filled 2017-02-26 (×2): qty 1

## 2017-02-26 MED ORDER — ACETAMINOPHEN 160 MG/5ML PO SOLN
975.0000 mg | Freq: Once | ORAL | Status: AC
  Administered 2017-02-26: 975 mg via ORAL
  Filled 2017-02-26: qty 40.6

## 2017-02-26 MED ORDER — WITCH HAZEL-GLYCERIN EX PADS: 1.0000 "application " | MEDICATED_PAD | CUTANEOUS | Status: DC | PRN

## 2017-02-26 MED ORDER — DEXAMETHASONE SODIUM PHOSPHATE 4 MG/ML IJ SOLN
INTRAMUSCULAR | Status: DC | PRN
  Administered 2017-02-26: 4 mg via INTRAVENOUS

## 2017-02-26 MED ORDER — ACETAMINOPHEN 160 MG/5ML PO SOLN
650.0000 mg | ORAL | Status: DC | PRN
  Administered 2017-02-28: 650 mg via ORAL
  Filled 2017-02-26: qty 20.3

## 2017-02-26 MED ORDER — NALBUPHINE HCL 10 MG/ML IJ SOLN
2.5000 mg | INTRAMUSCULAR | Status: DC | PRN
  Administered 2017-02-26 (×3): 2.5 mg via INTRAVENOUS
  Filled 2017-02-26 (×2): qty 1

## 2017-02-26 MED ORDER — PHENYLEPHRINE 8 MG IN D5W 100 ML (0.08MG/ML) PREMIX OPTIME
INJECTION | INTRAVENOUS | Status: AC
  Filled 2017-02-26: qty 100

## 2017-02-26 MED ORDER — ACETAMINOPHEN 325 MG PO TABS
650.0000 mg | ORAL_TABLET | ORAL | Status: DC | PRN
  Administered 2017-02-26: 650 mg via ORAL
  Filled 2017-02-26: qty 2

## 2017-02-26 MED ORDER — SIMETHICONE 80 MG PO CHEW: 80.0000 mg | CHEWABLE_TABLET | ORAL | Status: DC | PRN

## 2017-02-26 MED ORDER — OXYCODONE-ACETAMINOPHEN 5-325 MG PO TABS: 1.0000 | ORAL_TABLET | ORAL | Status: DC | PRN

## 2017-02-26 MED ORDER — INFLUENZA VAC SPLIT QUAD 0.5 ML IM SUSY
0.5000 mL | PREFILLED_SYRINGE | INTRAMUSCULAR | Status: AC
  Administered 2017-02-28: 0.5 mL via INTRAMUSCULAR
  Filled 2017-02-26: qty 0.5

## 2017-02-26 MED ORDER — DIPHENHYDRAMINE HCL 25 MG PO CAPS
25.0000 mg | ORAL_CAPSULE | Freq: Four times a day (QID) | ORAL | Status: DC | PRN
  Administered 2017-02-26: 25 mg via ORAL
  Filled 2017-02-26: qty 1

## 2017-02-26 MED ORDER — TETANUS-DIPHTH-ACELL PERTUSSIS 5-2.5-18.5 LF-MCG/0.5 IM SUSP: 0.5000 mL | Freq: Once | INTRAMUSCULAR | Status: DC

## 2017-02-26 MED ORDER — OXYTOCIN 10 UNIT/ML IJ SOLN
INTRAMUSCULAR | Status: AC
  Filled 2017-02-26: qty 4

## 2017-02-26 MED ORDER — OXYTOCIN 10 UNIT/ML IJ SOLN
INTRAVENOUS | Status: DC | PRN
  Administered 2017-02-26: 40 [IU] via INTRAVENOUS

## 2017-02-26 MED ORDER — ONDANSETRON HCL 4 MG/2ML IJ SOLN
INTRAMUSCULAR | Status: DC | PRN
  Administered 2017-02-26: 4 mg via INTRAVENOUS

## 2017-02-26 MED ORDER — FENTANYL CITRATE (PF) 100 MCG/2ML IJ SOLN: 25.0000 ug | INTRAMUSCULAR | Status: DC | PRN

## 2017-02-26 MED ORDER — SENNOSIDES-DOCUSATE SODIUM 8.6-50 MG PO TABS
2.0000 | ORAL_TABLET | ORAL | Status: DC
  Administered 2017-02-26 – 2017-02-27 (×2): 2 via ORAL
  Filled 2017-02-26 (×2): qty 2

## 2017-02-26 MED ORDER — SIMETHICONE 80 MG PO CHEW
80.0000 mg | CHEWABLE_TABLET | Freq: Three times a day (TID) | ORAL | Status: DC
  Administered 2017-02-27 – 2017-02-28 (×3): 80 mg via ORAL
  Filled 2017-02-26 (×4): qty 1

## 2017-02-26 MED ORDER — FENTANYL CITRATE (PF) 100 MCG/2ML IJ SOLN
INTRAMUSCULAR | Status: DC | PRN
  Administered 2017-02-26: 75 ug via INTRAVENOUS
  Administered 2017-02-26: 25 ug via INTRATHECAL

## 2017-02-26 MED ORDER — OXYTOCIN 40 UNITS IN LACTATED RINGERS INFUSION - SIMPLE MED: 2.5000 [IU]/h | INTRAVENOUS | Status: AC

## 2017-02-26 MED ORDER — OXYCODONE HCL 5 MG/5ML PO SOLN: 10.0000 mg | ORAL | Status: DC | PRN

## 2017-02-26 MED ORDER — MENTHOL 3 MG MT LOZG: 1.0000 | LOZENGE | OROMUCOSAL | Status: DC | PRN

## 2017-02-26 MED ORDER — PRENATAL MULTIVITAMIN CH: 1.0000 | ORAL_TABLET | Freq: Every day | ORAL | Status: DC

## 2017-02-26 MED ORDER — FENTANYL CITRATE (PF) 100 MCG/2ML IJ SOLN
INTRAMUSCULAR | Status: AC
Start: 2017-02-26 — End: 2017-02-26
  Filled 2017-02-26: qty 2

## 2017-02-26 MED ORDER — ONDANSETRON HCL 4 MG/2ML IJ SOLN
INTRAMUSCULAR | Status: AC
  Filled 2017-02-26: qty 2

## 2017-02-26 MED ORDER — LACTATED RINGERS IV SOLN
INTRAVENOUS | Status: DC
  Administered 2017-02-26: 23:00:00 via INTRAVENOUS

## 2017-02-26 MED ORDER — DEXAMETHASONE SODIUM PHOSPHATE 4 MG/ML IJ SOLN
INTRAMUSCULAR | Status: AC
  Filled 2017-02-26: qty 1

## 2017-02-26 MED ORDER — BUPIVACAINE HCL (PF) 0.25 % IJ SOLN
INTRAMUSCULAR | Status: AC
  Filled 2017-02-26: qty 20

## 2017-02-26 MED ORDER — BUPIVACAINE HCL (PF) 0.25 % IJ SOLN
INTRAMUSCULAR | Status: AC
  Filled 2017-02-26: qty 10

## 2017-02-26 MED ORDER — PHENYLEPHRINE 8 MG IN D5W 100 ML (0.08MG/ML) PREMIX OPTIME
INJECTION | INTRAVENOUS | Status: DC | PRN
  Administered 2017-02-26: 60 ug/min via INTRAVENOUS

## 2017-02-26 MED ORDER — CEFAZOLIN SODIUM-DEXTROSE 2-4 GM/100ML-% IV SOLN
2.0000 g | INTRAVENOUS | Status: AC
  Administered 2017-02-26: 2 g via INTRAVENOUS

## 2017-02-26 MED ORDER — NALBUPHINE HCL 10 MG/ML IJ SOLN
INTRAMUSCULAR | Status: AC
  Filled 2017-02-26: qty 1

## 2017-02-26 MED ORDER — ACETAMINOPHEN 160 MG/5ML PO SOLN
325.0000 mg | ORAL | Status: DC | PRN
  Administered 2017-02-28: 325 mg via ORAL
  Filled 2017-02-26: qty 20.3

## 2017-02-26 MED ORDER — LACTATED RINGERS IV SOLN
INTRAVENOUS | Status: DC | PRN
  Administered 2017-02-26: 10:00:00 via INTRAVENOUS

## 2017-02-26 MED ORDER — MORPHINE SULFATE (PF) 0.5 MG/ML IJ SOLN
INTRAMUSCULAR | Status: AC
  Filled 2017-02-26: qty 10

## 2017-02-26 MED ORDER — OXYCODONE-ACETAMINOPHEN 5-325 MG PO TABS: 2.0000 | ORAL_TABLET | ORAL | Status: DC | PRN

## 2017-02-26 MED ORDER — COCONUT OIL OIL: 1.0000 "application " | TOPICAL_OIL | Status: DC | PRN

## 2017-02-26 MED ORDER — IBUPROFEN 600 MG PO TABS: 600.0000 mg | ORAL_TABLET | Freq: Four times a day (QID) | ORAL | Status: DC

## 2017-02-26 MED ORDER — BUPIVACAINE HCL (PF) 0.25 % IJ SOLN
INTRAMUSCULAR | Status: DC | PRN
  Administered 2017-02-26: 30 mL

## 2017-02-26 MED ORDER — MORPHINE SULFATE (PF) 0.5 MG/ML IJ SOLN
INTRAMUSCULAR | Status: DC | PRN
  Administered 2017-02-26: .1 mg via INTRATHECAL

## 2017-02-26 MED ORDER — ZOLPIDEM TARTRATE 5 MG PO TABS: 5.0000 mg | ORAL_TABLET | Freq: Every evening | ORAL | Status: DC | PRN

## 2017-02-26 MED ORDER — OXYCODONE HCL 5 MG/5ML PO SOLN
5.0000 mg | ORAL | Status: DC | PRN
Start: 2017-02-26 — End: 2017-02-28
  Administered 2017-02-27 – 2017-02-28 (×4): 5 mg via ORAL
  Filled 2017-02-26 (×4): qty 5

## 2017-02-26 MED ORDER — SODIUM CHLORIDE 0.9 % IR SOLN
Status: DC | PRN
  Administered 2017-02-26: 1000 mL

## 2017-02-26 SURGICAL SUPPLY — 36 items
BENZOIN TINCTURE PRP APPL 2/3 (GAUZE/BANDAGES/DRESSINGS) ×3 IMPLANT
CHLORAPREP W/TINT 26ML (MISCELLANEOUS) ×3 IMPLANT
CLAMP CORD UMBIL (MISCELLANEOUS) IMPLANT
CLOSURE WOUND 1/2 X4 (GAUZE/BANDAGES/DRESSINGS) ×1
CLOTH BEACON ORANGE TIMEOUT ST (SAFETY) ×3 IMPLANT
DRSG OPSITE POSTOP 4X10 (GAUZE/BANDAGES/DRESSINGS) ×3 IMPLANT
ELECT REM PT RETURN 9FT ADLT (ELECTROSURGICAL) ×3
ELECTRODE REM PT RTRN 9FT ADLT (ELECTROSURGICAL) ×1 IMPLANT
EXTRACTOR VACUUM BELL STYLE (SUCTIONS) IMPLANT
EXTRACTOR VACUUM KIWI (MISCELLANEOUS) IMPLANT
GLOVE BIO SURGEON STRL SZ 6.5 (GLOVE) ×2 IMPLANT
GLOVE BIO SURGEONS STRL SZ 6.5 (GLOVE) ×1
GLOVE BIOGEL PI IND STRL 7.0 (GLOVE) ×2 IMPLANT
GLOVE BIOGEL PI INDICATOR 7.0 (GLOVE) ×4
GOWN STRL REUS W/TWL LRG LVL3 (GOWN DISPOSABLE) ×9 IMPLANT
KIT ABG SYR 3ML LUER SLIP (SYRINGE) IMPLANT
NEEDLE HYPO 22GX1.5 SAFETY (NEEDLE) ×3 IMPLANT
NEEDLE HYPO 25X5/8 SAFETYGLIDE (NEEDLE) IMPLANT
NS IRRIG 1000ML POUR BTL (IV SOLUTION) ×3 IMPLANT
PACK C SECTION WH (CUSTOM PROCEDURE TRAY) ×3 IMPLANT
PAD OB MATERNITY 4.3X12.25 (PERSONAL CARE ITEMS) ×3 IMPLANT
PENCIL SMOKE EVAC W/HOLSTER (ELECTROSURGICAL) ×3 IMPLANT
RTRCTR C-SECT PINK 25CM LRG (MISCELLANEOUS) ×3 IMPLANT
STRIP CLOSURE SKIN 1/2X4 (GAUZE/BANDAGES/DRESSINGS) ×2 IMPLANT
SUT CHROMIC 2 0 CT 1 (SUTURE) ×6 IMPLANT
SUT MNCRL 0 VIOLET CTX 36 (SUTURE) ×2 IMPLANT
SUT MONOCRYL 0 CTX 36 (SUTURE) ×4
SUT PDS AB 0 CTX 36 PDP370T (SUTURE) ×3 IMPLANT
SUT PLAIN 2 0 (SUTURE)
SUT PLAIN ABS 2-0 CT1 27XMFL (SUTURE) IMPLANT
SUT VIC AB 0 CTX 36 (SUTURE) ×4
SUT VIC AB 0 CTX36XBRD ANBCTRL (SUTURE) ×2 IMPLANT
SUT VIC AB 4-0 KS 27 (SUTURE) ×3 IMPLANT
SYR CONTROL 10ML LL (SYRINGE) ×3 IMPLANT
TOWEL OR 17X24 6PK STRL BLUE (TOWEL DISPOSABLE) ×3 IMPLANT
TRAY FOLEY BAG SILVER LF 14FR (SET/KITS/TRAYS/PACK) ×3 IMPLANT

## 2017-02-26 NOTE — H&P (Signed)
Christy [name] is a 27 y.o. female presenting for elective repeat cesarean delivery  OB History    Gravida Para Term Preterm AB Living   2 1   1   1    SAB TAB Ectopic Multiple Live Births         0 1     Past Medical History:  Diagnosis Date  . Asthma   . Depression   . Gestational diabetes   . Hx of chlamydia infection   . Preeclampsia    Past Surgical History:  Procedure Laterality Date  . CESAREAN SECTION N/A 09/07/2014   Procedure: CESAREAN SECTION;  Surgeon: Essie HartWalda Pradeep Beaubrun, MD;  Location: [facility];  Service: Obstetrics;  Laterality: N/A;   Family History: family history includes Diabetes in her paternal grandmother; Heart disease in her maternal grandmother; Hypertension in her maternal grandmother; Mental illness in her father and maternal grandmother. Social History:  reports that she quit smoking about 3 years ago. she has never used smokeless tobacco. She reports that she does not drink alcohol or use drugs.     Maternal Diabetes: Yes:  Diabetes Type:  Diet controlled Genetic Screening: Normal Maternal Ultrasounds/Referrals: Normal Fetal Ultrasounds or other Referrals:  Referred to Materal Fetal Medicine  for cleft lip Maternal Substance Abuse:  No Significant Maternal Medications:  None Significant Maternal Lab Results:  Lab values include: Group B Strep negative Other Comments:  None  ROS History   Blood pressure 122/72, pulse 88, temperature 97.9 F (36.6 C), temperature source Oral, resp. rate 18, height 5\' 3"  (1.6 m), weight 90.2 kg (198 lb 12.8 oz), last menstrual period 05/14/2016. Exam Physical Exam  Prenatal labs: ABO, Rh: --/--/A POS (01/21 1000) Antibody: NEG (01/21 1000) Rubella: Nonimmune (06/18 0000) RPR: Non Reactive (01/21 1000)  HBsAg: Negative (06/18 0000)  HIV: Non-reactive (06/18 0000)  GBS:   Neg  Assessment/Plan: 27 yo G2P0101 presents for elective repeat cesarean delivery A1GDM controlled on diet Baby with cleft lip / possible cleft  palate genetics normal Ancef 2gm IV on call to OR   Laurabeth Yip STACIA 02/26/2017, 8:55 AM

## 2017-02-26 NOTE — Brief Op Note (Signed)
02/26/2017  10:48 AM  PATIENT:  Christy [name]  27 y.o. female  PRE-OPERATIVE DIAGNOSIS:  Prior Cesarean Section  POST-OPERATIVE DIAGNOSIS:  Prior Cesarean Section  PROCEDURE:  Procedure(s) with comments: REPEAT CESAREAN SECTION (N/A) - Tracey RNFA  SURGEON:  Surgeon(s) and Role:    * Essie HartPinn, Grethel Zenk, MD - Primary   ASSISTANTS: Pearson Forsterucker, Tracie RNFA   ANESTHESIA:   spinal  EBL:  514 mL   BLOOD ADMINISTERED:none  DRAINS: Urinary Catheter (Foley)   LOCAL MEDICATIONS USED:  MARCAINE    and Amount: 30 ml  SPECIMEN:  No Specimen  DISPOSITION OF SPECIMEN:  N/A  COUNTS:  YES  TOURNIQUET:  * No tourniquets in log *  DICTATION: .Note written in EPIC  PLAN OF CARE: Admit to inpatient   PATIENT DISPOSITION:  PACU - hemodynamically stable.   Delay start of Pharmacological VTE agent (>24hrs) due to surgical blood loss or risk of bleeding: not applicable  Christy [name]

## 2017-02-26 NOTE — Anesthesia Procedure Notes (Signed)
Spinal  Patient location during procedure: OR Staffing Anesthesiologist: Carrianne Hyun, MD Spinal Block Patient position: sitting Prep: DuraPrep Patient monitoring: heart rate, blood pressure and continuous pulse ox Approach: right paramedian Location: L3-4 Injection technique: single-shot Needle Needle type: Sprotte  Needle gauge: 24 G Needle length: 9 cm Assessment Sensory level: T4 Additional Notes Spinal Dosage in OR  .75% Bupivicaine ml       1.6     [id number]   mcg        100    Fentanyl mcg            25      

## 2017-02-26 NOTE — Lactation Note (Signed)
Lactation Consultation Note  Patient Name: Christy BisKristen Old ZOXWR'UToday's [name]: 02/26/2017 Reason for consult: Initial assessment;Mother's request;Other (Comment)(Infant with Cleft Palate on US)   Spoke with mom to let her know I will be in the Recovery Room to assist with Lizzie's first feeding. Mom asking about breast milk going up into her nose, discussed anytime you have an open palate, it may happen. We will try to minimize this with postioning.   Mom really wants infant to exclusively BF. Discussed with mom that Luanne BrasLizzie will have to be assessed for transfer and most likely will need some supplementation if unable to form a good seal. Mom voiced understanding.   Will follow up after delivery.    Maternal Data Formula Feeding for Exclusion: No Does the patient have breastfeeding experience prior to this delivery?: Yes  Feeding    LATCH Score                   Interventions    Lactation Tools Discussed/Used     Consult Status Consult Status: Follow-up [name]: 02/26/17 Follow-up type: In-patient    Silas FloodSharon S Letita Prentiss 02/26/2017, 9:36 AM

## 2017-02-26 NOTE — Anesthesia Preprocedure Evaluation (Signed)
Anesthesia Evaluation  Patient identified by MRN, date of birth, ID band Patient awake    Reviewed: Allergy & Precautions, H&P , Patient's Chart, lab work & pertinent test results  Airway Mallampati: II  TM Distance: >3 FB Neck ROM: full    Dental  (+) Teeth Intact   Pulmonary asthma , former smoker,    breath sounds clear to auscultation       Cardiovascular hypertension,  Rhythm:regular Rate:Normal     Neuro/Psych    GI/Hepatic   Endo/Other  diabetes, Gestational  Renal/GU      Musculoskeletal   Abdominal   Peds  Hematology   Anesthesia Other Findings       Reproductive/Obstetrics (+) Pregnancy                             Anesthesia Physical Anesthesia Plan  ASA: II  Anesthesia Plan: Spinal   Post-op Pain Management:    Induction:   PONV Risk Score and Plan:   Airway Management Planned:   Additional Equipment:   Intra-op Plan:   Post-operative Plan:   Informed Consent:   Plan Discussed with:   Anesthesia Plan Comments: (  )        Anesthesia Quick Evaluation

## 2017-02-26 NOTE — Lactation Note (Addendum)
This note was copied from a baby's chart. Lactation Consultation Note  Patient Name: Christy [name]: 02/26/2017 Reason for consult: Initial assessment;Mother's request;Maternal endocrine disorder;Other (Comment)(Suspected Cleft palate on US, does not have cleft palate on exam) Type of Endocrine Disorder?: Diabetes(GDM)   Initial assessment with Exp BF [facility] 1 hour [facility]. Infant is awake and cueing to feed. RN reports infant latched for about 5 minutes in the OR. Infant has been examined by NNP and Dr. Ronalee RedHartsell and found not to have a Cleft Palate or lip.   Assisted mom in latching infant to both breasts. Infant latched well with flanged lips and rhythmic suckles, infant with multiple swallows. Infant was still cueing to feed post BF. Hand expressed mom and spoon fed infant 12 cc colostrum. Mom with soft compressible breasts and areola with everted nipples. Colostrum very easily expressible. Mom reports + breast growth with pregnancy.   Enc mom to feed infant STS 8-12 x in 24 hours at first feeding cues. Enc mom to keep infant STS as much as possible. Showed mom how to hand express and to spoon feed infant. Enc mom to support infant with pillows and head support with feeding. Enc mom to massage/compress breast with feeding. Enc mom to stimulate infant with feeding as needed. Mom did well with stimulating the infant and massage/compression of the breast with feeding.   BF Resources handout and LC Brochure given, mom informed of IP/OP Services, BF Support Groups and LC phone #. Enc mom to call out for feeding assistance as needed. [facility]. Mom has a hand pump at home.   Enc mom to call out for feeding assistance as needed.      Maternal Data Formula Feeding for Exclusion: No Has patient been taught Hand Expression?: Yes Does the patient have breastfeeding experience prior to this delivery?:  Yes  Feeding Feeding Type: Breast Fed Length of feed: 30 min  LATCH Score Latch: Grasps breast easily, tongue down, lips flanged, rhythmical sucking.  Audible Swallowing: Spontaneous and intermittent  Type of Nipple: Everted at rest and after stimulation  Comfort (Breast/Nipple): Soft / non-tender  Hold (Positioning): Assistance needed to correctly position infant at breast and maintain latch.  LATCH Score: 9  Interventions Interventions: Breast feeding basics reviewed;Support pillows;Assisted with latch;Position options;Skin to skin;Expressed milk;Breast massage;Reverse pressure;Breast compression;Adjust position  Lactation Tools Discussed/Used WIC Program: Yes   Consult Status Consult Status: Follow-up [name]: 02/27/17 Follow-up type: In-patient    [name] 02/26/2017, 12:07 PM

## 2017-02-26 NOTE — Transfer of Care (Signed)
Immediate Anesthesia Transfer of Care Note  Patient: Christy [name]  Procedure(s) Performed: REPEAT CESAREAN SECTION (N/A Abdomen)  Patient Location: PACU  Anesthesia Type:Spinal  Level of Consciousness: awake, alert  and oriented  Airway & Oxygen Therapy: Patient Spontanous Breathing  Post-op Assessment: Report given to RN and Post -op Vital signs reviewed and stable  Post vital signs: Reviewed and stable  Last Vitals:  Vitals:   02/26/17 1109 02/26/17 1110  BP: 122/69   Pulse:  79  Resp:  11  Temp: (!) 36.3 C   SpO2:  100%    Last Pain:  Vitals:   02/26/17 1110  TempSrc:   PainSc: 0-No pain         Complications: No apparent anesthesia complications

## 2017-02-26 NOTE — Anesthesia Postprocedure Evaluation (Signed)
Anesthesia Post Note  Patient: Christy [name]  Procedure(s) Performed: REPEAT CESAREAN SECTION (N/A Abdomen)     Patient location during evaluation: Mother Baby Anesthesia Type: Spinal Level of consciousness: awake and alert Pain management: pain level controlled Vital Signs Assessment: post-procedure vital signs reviewed and stable Respiratory status: spontaneous breathing, nonlabored ventilation and respiratory function stable Cardiovascular status: stable Postop Assessment: no headache, no backache, adequate PO intake, no apparent nausea or vomiting, patient able to bend at knees and spinal receding Anesthetic complications: no    Last Vitals:  Vitals:   02/26/17 1430 02/26/17 1530  BP: 121/63   Pulse: 82   Resp: 16 16  Temp: 36.8 C 36.4 C  SpO2: 100% 99%    Last Pain:  Vitals:   02/26/17 1630  TempSrc:   PainSc: 3    Pain Goal:                 Land O'LakesMalinova,Dallis Czaja Hristova

## 2017-02-27 LAB — CBC
HEMATOCRIT: 28.9 % — AB (ref 36.0–46.0)
HEMOGLOBIN: 9.2 g/dL — AB (ref 12.0–15.0)
MCH: 26.6 pg (ref 26.0–34.0)
MCHC: 31.8 g/dL (ref 30.0–36.0)
MCV: 83.5 fL (ref 78.0–100.0)
Platelets: 194 10*3/uL (ref 150–400)
RBC: 3.46 MIL/uL — ABNORMAL LOW (ref 3.87–5.11)
RDW: 19.3 % — AB (ref 11.5–15.5)
WBC: 7.6 10*3/uL (ref 4.0–10.5)

## 2017-02-27 LAB — BIRTH TISSUE RECOVERY COLLECTION (PLACENTA DONATION)

## 2017-02-27 MED ORDER — MEASLES, MUMPS & RUBELLA VAC ~~LOC~~ INJ
0.5000 mL | INJECTION | Freq: Once | SUBCUTANEOUS | Status: AC
  Administered 2017-02-28: 0.5 mL via SUBCUTANEOUS
  Filled 2017-02-27 (×2): qty 0.5

## 2017-02-27 MED ORDER — FERROUS SULFATE 325 (65 FE) MG PO TABS
325.0000 mg | ORAL_TABLET | Freq: Every day | ORAL | Status: DC
  Administered 2017-02-27 – 2017-02-28 (×2): 325 mg via ORAL
  Filled 2017-02-27 (×2): qty 1

## 2017-02-27 MED ORDER — FAMOTIDINE 20 MG PO TABS
20.0000 mg | ORAL_TABLET | Freq: Every day | ORAL | Status: DC
  Administered 2017-02-27: 20 mg via ORAL
  Filled 2017-02-27: qty 1

## 2017-02-27 NOTE — Progress Notes (Signed)
Christy Bell [id number]  Subjective: Postpartum Day 1: Repeat C/S, scheduled, hx GDM, diet controlled. Patient up ad lib, reports no syncope or dizziness.  Foley removed this am, no spontaneous void yet, pushing po fluids.  Reports chronic reflux, has been on OTC Zantac at home, requests to continue in hospital. Feeding:  Breast Contraceptive plan:  IUD Known rubella non-immune.  No cleft palate/lip noted on baby's exam by pediatrician.  Objective: Temp:  [97.4 F (36.3 C)-98.7 F (37.1 C)] 98.7 F (37.1 C) (01/23 0554) Pulse Rate:  [73-88] 81 (01/23 0554) Resp:  [11-20] 18 (01/23 0554) BP: (94-122)/(55-80) 105/56 (01/23 0554) SpO2:  [99 %-100 %] 99 % (01/23 0554) Weight:  [90.2 kg (198 lb 12.8 oz)] 90.2 kg (198 lb 12.8 oz) (01/22 0809)   Vitals:   02/26/17 1930 02/26/17 2330 02/27/17 0330 02/27/17 0554  BP: (!) 1'01/55 97/60 94/67 ' (!) 105/56  Pulse: 82 81 78 81  Resp: '20 18 18 18  ' Temp: 98.2 F (36.8 C) 98.3 F (36.8 C) 98.4 F (36.9 C) 98.7 F (37.1 C)  TempSrc: Oral Oral Oral Oral  SpO2: 99% 99% 100% 99%  Weight:      Height:       Orthostatics stable.  CBC Latest Ref Rng & Units 02/27/2017 02/25/2017 09/11/2014  WBC 4.0 - 10.5 K/uL 7.6 5.2 9.5  Hemoglobin 12.0 - 15.0 g/dL 9.2(L) 10.4(L) 8.9(L)  Hematocrit 36.0 - 46.0 % 28.9(L) 33.2(L) 27.8(L)  Platelets 150 - 400 K/uL 194 215 222   CBG (last 3)  Recent Labs    02/26/17 0833 02/26/17 1144  GLUCAP 89 91    Physical Exam:  General: alert Lochia: appropriate Uterine Fundus: firm Abdomen:  + bowel sounds Incision: Honeycomb dressing with old drainage marked, no active bleeding. DVT Evaluation: No evidence of DVT seen on physical exam. Negative Homan's sign.   Assessment/Plan: Status post cesarean delivery, day 1. Stable Anemia due to acute blood loss, mild, asymptomatic Continue current care. Fe q day (has been on at home). Rx Zantac (or formulary equivalent) Offer MMR vaccine. RN to change  honeycomb dsg today. Consider d/c tomorrow.     Donnel Saxon MSN, CNM 02/27/2017, 7:19 AM

## 2017-02-27 NOTE — Lactation Note (Signed)
This note was copied from a baby's chart. Lactation Consultation Note  P2 mom.  Recently fed infant 1 hour ago.  Mom  c/o difficulty latching infant on left side.  LC undressed infant with mom's permission to attempt to wake and observe latch.  Infant had a very wet and dirty diaper.  LC changed diaper then mom attempted to latch in football position with infant cueing; hands to mouth.  Infant would attempt to latch then fall immediately asleep.  Compression and massage used and infant would not wake.  LC removed infant and mom was agreeable to try on left side cross cradle.  Infant opened to latch then once at the breast fell asleep.  Mom easily hand expressed milk to entice infant but infant would not wake.    LC offered to assist mom in collecting any colostrum into containers.  Mom states she will take them but would like to rest since her family has the 25 year old daughter out of the room at this time.  Mom states she prefers to spoon feed her when she collects any to feed back to her.  LC brought 3 new spoons and containers and left at bedside.  LC encouraged mom to call out for assistance with positioning or latch with next feed.      Patient Name: Christy Bell BMWUX'LToday's Date: 02/27/2017 Reason for consult: Follow-up assessment   Maternal Data    Feeding    LATCH Score                   Interventions Interventions: Breast feeding basics reviewed;Assisted with latch;Skin to skin;Breast massage;Hand express;Position options;Support pillows;Adjust position  Lactation Tools Discussed/Used     Consult Status Consult Status: Follow-up Date: 02/28/17 Follow-up type: In-patient    Maryruth HancockKelly Suzanne [facility] HospitalBlack 02/27/2017, 5:45 PM

## 2017-02-28 ENCOUNTER — Encounter: Payer: Self-pay | Admitting: Obstetrics and Gynecology

## 2017-02-28 MED ORDER — OXYCODONE HCL 5 MG/5ML PO SOLN: 5.0000 mg | ORAL | 0 refills | Status: DC | PRN

## 2017-02-28 MED ORDER — IBUPROFEN 100 MG/5ML PO SUSP: 600.0000 mg | Freq: Four times a day (QID) | ORAL | 2 refills | Status: DC

## 2017-02-28 MED ORDER — ACETAMINOPHEN 160 MG/5ML PO SOLN: 650.0000 mg | ORAL | 0 refills | Status: DC | PRN

## 2017-02-28 NOTE — Progress Notes (Signed)
CSW received consult for hx of Anxiety and Depression.  CSW met with MOB to offer support and complete assessment.    When CSW arrived, MOB was resting in bed, MOB's grandfather was holding infant in recliner, and MOB's mother was holding MOB's toddler on the couch.  CSW explained CSW's role and MOB gave CSW permission to complete the assessment while MOB's guest were present.  MOB and family was polite and receptive to meeting with CSW.   MOB asked about MOB's thoughts and feelings about being a mother again.  MOB shared that MOB is excited but a little nervous about experiencing PPD again. 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  CSW validated MOB's thoughts and feelings.  MOB reported that MOB experienced daily crying, isolation, and feelings of hopeless. MOB reported that MOB did reach out to her OB provided and her outpatient counselor for assistance.  CSW provided education regarding the baby blues period vs. perinatal mood disorders, discussed treatment and gave resources for mental health follow up if concerns arise.  CSW recommends self-evaluation during the postpartum time period using the New Mom Checklist from Postpartum Progress and encouraged MOB to contact a medical professional if symptoms are noted at any time. MOB denied SI and HI and communicated a wealth of supporters.     CSW identifies no further need for intervention and no barriers to discharge at this time.  Laurey Arrow, MSW, LCSW Clinical Social Work [phone]

## 2017-02-28 NOTE — Plan of Care (Signed)
Patient progressing appropriately. Pain controlled with medication. Incision clean, dry, and intact.

## 2017-02-28 NOTE — Lactation Note (Signed)
This note was copied from a baby's chart. Lactation Consultation Note: experienced BF mom. Baby awake and showing feeding cues. Mom going to bathroom. Mom easily latched baby with little assist from me. Lots of swallows noted. No questions at present. Reviewed our phone number, OP appointments and BFSG as resources for support after DC.To call prn.  Patient Name: Christy Bell WJXBJ'YToday's Date: 02/28/2017 Reason for consult: Follow-up assessment   Maternal Data Formula Feeding for Exclusion: No Has patient been taught Hand Expression?: Yes Does the patient have breastfeeding experience prior to this delivery?: Yes  Feeding Feeding Type: Breast Fed Length of feed: 25 min  LATCH Score Latch: Grasps breast easily, tongue down, lips flanged, rhythmical sucking.  Audible Swallowing: Spontaneous and intermittent  Type of Nipple: Everted at rest and after stimulation  Comfort (Breast/Nipple): Filling, red/small blisters or bruises, mild/mod discomfort  Hold (Positioning): No assistance needed to correctly position infant at breast.  LATCH Score: 9  Interventions    Lactation Tools Discussed/Used     Consult Status Consult Status: Complete    Pamelia HoitWeeks, Alto Gandolfo D 02/28/2017, 9:40 AM

## 2017-02-28 NOTE — Discharge Instructions (Signed)

## 2017-02-28 NOTE — Discharge Summary (Signed)
OB Discharge Summary     Patient Name: Christy Bell DOB: [birth year] MRN: [id number]  Date of admission: 02/26/2017 Delivering MD: Essie Hart   Date of discharge: 02/28/2017  Admitting diagnosis: Prior Cesarean Section Intrauterine pregnancy: [redacted]w[redacted]d     Secondary diagnosis:  Active Problems:   Status post repeat low transverse cesarean section  Additional problems: None     Discharge diagnosis: Term Pregnancy Delivered, repeat C/S, anemia due to acute blood loss                                                                                            Post partum procedures:NA  Augmentation: NA  Complications: None  Hospital course:  Sceduled C/S   27 y.o. yo G2P0101 at [redacted]w[redacted]d was admitted to the hospital 02/26/2017 for scheduled cesarean section with the following indication:Elective Repeat.  Membrane Rupture Time/Date: 10:14 AM ,02/26/2017   Patient delivered a Viable infant.02/26/2017  Details of operation can be found in separate operative note.  Patient had an uncomplicated postpartum course.  She is ambulating, tolerating a regular diet, passing flatus, and urinating well. Patient is discharged home in stable condition on  02/28/17.  Remains on Zoloft and will continue iron supplementation at home.  Rx Oxycodone 5 mg solution for limited prn course after d/c.         Physical exam  Vitals:   02/27/17 0554 02/27/17 0949 02/27/17 1655 02/28/17 0504  BP: (!) 105/56 109/62 113/70 111/70  Pulse: 81 84 88 77  Resp: 18 18 18 16   Temp: 98.7 F (37.1 C) 98.4 F (36.9 C) 97.8 F (36.6 C) 98.2 F (36.8 C)  TempSrc: Oral Oral Axillary Oral  SpO2: 99%     Weight:      Height:       General: alert Lochia: appropriate Uterine Fundus: firm Incision: Healing well with no significant drainage--honeycomb dressing CDI (replaced yesterday). DVT Evaluation: No evidence of DVT seen on physical exam. Negative Homan's sign. Labs:  CBC Latest Ref Rng & Units 02/27/2017 02/25/2017  09/11/2014  WBC 4.0 - 10.5 K/uL 7.6 5.2 9.5  Hemoglobin 12.0 - 15.0 g/dL 4.7(W) 10.4(L) 8.9(L)  Hematocrit 36.0 - 46.0 % 28.9(L) 33.2(L) 27.8(L)  Platelets 150 - 400 K/uL 194 215 222   CMP Latest Ref Rng & Units 09/11/2014  Glucose 65 - 99 mg/dL 88  BUN 6 - 20 mg/dL 7  Creatinine 2.95 - 6.21 mg/dL 3.08  Sodium 657 - 846 mmol/L 138  Potassium 3.5 - 5.1 mmol/L 4.5  Chloride 101 - 111 mmol/L 107  CO2 22 - 32 mmol/L 22  Calcium 8.9 - 10.3 mg/dL 8.2(L)  Total Protein 6.5 - 8.1 g/dL 5.3(L)  Total Bilirubin 0.3 - 1.2 mg/dL 0.4  Alkaline Phos 38 - 126 U/L 106  AST 15 - 41 U/L 33  ALT 14 - 54 U/L 26    Discharge instruction: per After Visit Summary and "Baby and Me Booklet".  After visit meds:  Allergies as of 02/28/2017      Reactions   Flagyl [metronidazole] Nausea And Vomiting      Medication List  STOP taking these medications   ipratropium 0.06 % nasal spray Commonly known as:  ATROVENT   ondansetron 4 MG tablet Commonly known as:  ZOFRAN     TAKE these medications   acetaminophen 325 MG tablet Commonly known as:  TYLENOL Take 650 mg by mouth every 6 (six) hours as needed for moderate pain or headache. What changed:  Another medication with the same name was added. Make sure you understand how and when to take each.   acetaminophen 160 MG/5ML solution Commonly known as:  TYLENOL Take 20.3 mLs (650 mg total) by mouth every 4 (four) hours as needed (For pain scale > 7). What changed:  You were already taking a medication with the same name, and this prescription was added. Make sure you understand how and when to take each.   albuterol 108 (90 Base) MCG/ACT inhaler Commonly known as:  PROVENTIL HFA;VENTOLIN HFA Inhale 2 puffs into the lungs every 4 (four) hours as needed for wheezing or shortness of breath.   ibuprofen 100 MG/5ML suspension Commonly known as:  ADVIL,MOTRIN Take 30 mLs (600 mg total) by mouth every 6 (six) hours.   oxyCODONE 5 MG/5ML  solution Commonly known as:  ROXICODONE Take 5 mLs (5 mg total) by mouth every 4 (four) hours as needed (For scale > 7).   sertraline 50 MG tablet Commonly known as:  ZOLOFT Take 50 mg by mouth daily.       Diet: routine diet  Activity: Advance as tolerated. Pelvic rest for 6 weeks.   Outpatient follow up:1 week with Dr. Mora ApplPinn (scheduled) Follow up Appt:No future appointments. Follow up Visit:No Follow-up on file.  Postpartum contraception: IUD Mirena  Newborn Data: Live born female  Birth Weight: 6 lb 13.4 oz (3100 g) APGAR: 8, 9  Newborn Delivery   Birth date/time:  02/26/2017 10:14:00 Delivery type:  C-Section, Low Transverse C-section categorization:  Repeat     Baby Feeding: Breast Disposition:home with mother   02/28/2017 Nigel BridgemanVicki Emmily Pellegrin, CNM

## 2017-03-15 NOTE — Op Note (Signed)
Cesarean Section Procedure Note  Indications: previous uterine incision kerr x 1  Pre-operative Diagnosis: 39 week 1 day pregnancy, prior Low transverse cesarean section  Post-operative Diagnosis: same  Surgeon: Wynonia HazardPINN, Areg Bialas STACIA MD  Assistants: Genice RougeUCKER, TRACEY RNFA  Anesthesia: Spinal anesthesia  ASA Class: 2  Procedure Details   The patient was counseled about the risks, benefits, complications of the cesarean section. The patient concurred with the proposed plan, giving informed consent.  The site of surgery properly noted/marked. The patient was taken to Operating Room identified as Christy Bell and the procedure verified as C-Section Delivery. A Time Out was held and the above information confirmed.  After spinal was found to adequate , the patient was placed in the dorsal supine position with a leftward tilt, draped and prepped in the usual sterile manner. A Pfannenstiel incision was made and carried down through the subcutaneous tissue to the fascia.  The fascia was incised in the midline and the fascial incision was extended laterally with Mayo scissors. The superior aspect of the fascial incision was grasped with Coker clamps x2, tented up and the rectus muscles dissected off sharply with the scalpel. The rectus was then dissected off with blunt dissection and Mayo scissors inferiorly. The rectus muscles were separated in the midline. The abdominal peritoneum was identified, tented up between two hemostats, entered sharply using Metzenbaum scissors, and the incision was extended superiorly and inferiorly with good visualization of the bladder. A Richardson retractor was placed anteriorly and the bladder blade was placed posteriorly.   The vesicouterine peritoneum was identified, tented up, entered sharply with Metzenbaum scissors, and the bladder flap was created digitally. Scalpel was then used to make a low transverse incision on the uterus which was extended laterally with   blunt dissection. The fetal vertex was identified,the head was brought out from the pelvis.   The head was then delivered easily through the uterine incision followed by the body. The A live female infant was bulb suctioned on the operative field cried vigorously, cord was clamped and cut and the infant was passed to the waiting neonatologist. Apgars 8/9. Placenta was then delivered spontaneously, intact and appear normal, the uterus was cleared of all clot and debris. The uterine incision was repaired with #0 Monocryl in running locked fashion. A second imbricating suture was performed using the same suture. The incision was hemostatic. Ovaries and tubes were inspected and normal. The Bladder blade was removed. The abdominal cavity was cleared of all clot and debris. The abdominal peritoneum was reapproximated with 2-0 chromic  in a running fashion, the rectus muscles was reapproximated with #2 chromic in interrupted fashion. The fascia was closed with 0 PDS in a running fashion starting from each end and meeting in the middle. The subcuticular layer was irrigated and all bleeders cauterized.   The incision was injected with 25 mL of 0.25% Marcaine. Interrupted sutures of 2-0 plain were used to re-approximate Scarpas fascia.  The skin was closed with 4-0 vicryl in a subcuticular fashion using a Mellody DanceKeith needle. The incision was dressed with benzoine, steri strips and pressure dressing. All sponge lap and needle counts were correct x3. Patient tolerated the procedure well and recovered in stable condition following the procedure.  Instrument, sponge, and needle counts were correct prior the abdominal closure and at the conclusion of the case.   Findings: Live female  infant, Apgars 8/9, clear amniotic fluid, normal appearing placenta, normal uterus, bilateral tubes and ovaries  Estimated Blood Loss: 689mL  IVF:          Drains: Foley catheter  Urine output: 200 mL clear urine         Specimens:  Placenta to L&D         Implants: none         Complications:  None; patient tolerated the procedure well.         Disposition: PACU - hemodynamically stable.   Christy Bell STACIA

## 2017-05-13 ENCOUNTER — Encounter (HOSPITAL_COMMUNITY): Payer: Self-pay | Admitting: Family Medicine

## 2017-05-13 ENCOUNTER — Ambulatory Visit (HOSPITAL_COMMUNITY)
Admission: EM | Admit: 2017-05-13 | Discharge: 2017-05-13 | Disposition: A | Discharge status: Home / Self Care | Payer: Medicaid Other | Attending: Family Medicine | Admitting: Family Medicine

## 2017-05-13 DIAGNOSIS — J0140 Acute pansinusitis, unspecified: Secondary | ICD-10-CM

## 2017-05-13 MED ORDER — AMOXICILLIN-POT CLAVULANATE 875-125 MG PO TABS: 1.0000 | ORAL_TABLET | Freq: Two times a day (BID) | ORAL | 0 refills | Status: DC

## 2017-05-13 MED ORDER — CROMOLYN SODIUM 5.2 MG/ACT NA AERS: 1.0000 | INHALATION_SPRAY | Freq: Four times a day (QID) | NASAL | 12 refills | Status: DC

## 2017-05-13 MED ORDER — LORATADINE 10 MG PO TABS: 10.0000 mg | ORAL_TABLET | Freq: Every day | ORAL | 0 refills | Status: DC

## 2017-05-13 NOTE — ED Provider Notes (Signed)
[facility]    CSN: [id number] Arrival date & time: 05/13/17  1920     History   Chief Complaint Chief Complaint  Patient presents with  . Facial Pain    HPI Christy Bell is a 27 y.o. female.   27 year old female comes in for 2 week history of URI symptoms. Has had rhinorrhea, nasal congestion, productive cough, sinus pressure/pain. Denies fever, chills, night sweats. Has been taking tylenol with some relief. Denies chest pain, shortness of breath, wheezing. Currently breast-feeding, infant 2 months old.     Past Medical History:  Diagnosis Date  . Asthma   . Depression   . Gestational diabetes   . Hx of chlamydia infection   . Preeclampsia     Patient Active Problem List   Diagnosis Date Noted  . Status post repeat low transverse cesarean section 02/26/2017    Past Surgical History:  Procedure Laterality Date  . CESAREAN SECTION N/A 09/07/2014   Procedure: CESAREAN SECTION;  Surgeon: Essie Hart, MD;  Location: [facility];  Service: Obstetrics;  Laterality: N/A;  . CESAREAN SECTION N/A 02/26/2017   Procedure: REPEAT CESAREAN SECTION;  Surgeon: Essie Hart, MD;  Location: [facility];  Service: Obstetrics;  Laterality: N/A;  Tracey RNFA    OB History    Gravida  2   Para  1   Term      Preterm  1   AB      Living  1     SAB      TAB      Ectopic      Multiple  0   Live Births  1            Home Medications    Prior to Admission medications   Medication Sig Start Date End Date Taking? Authorizing Provider  acetaminophen (TYLENOL) 160 MG/5ML solution Take 20.3 mLs (650 mg total) by mouth every 4 (four) hours as needed (For pain scale > 7). 02/28/17   Nigel Bridgeman, CNM  acetaminophen (TYLENOL) 325 MG tablet Take 650 mg by mouth every 6 (six) hours as needed for moderate pain or headache.    [provider]  amoxicillin-clavulanate (AUGMENTIN) 875-125 MG tablet Take 1 tablet by mouth every 12 (twelve) hours. 05/13/17    Cathie Hoops, Guelda Batson V, PA-C  cromolyn (NASALCROM) 5.2 MG/ACT nasal spray Place 1 spray into both nostrils 4 (four) times daily. 05/13/17   Cathie Hoops, Keval Nam V, PA-C  ibuprofen (ADVIL,MOTRIN) 100 MG/5ML suspension Take 30 mLs (600 mg total) by mouth every 6 (six) hours. 02/28/17   Nigel Bridgeman, CNM  loratadine (CLARITIN) 10 MG tablet Take 1 tablet (10 mg total) by mouth daily. 05/13/17   Cathie Hoops, Sylvester Minton V, PA-C  sertraline (ZOLOFT) 50 MG tablet Take 50 mg by mouth daily.    [provider]    Family History Family History  Problem Relation Age of Onset  . Mental illness Father   . Hypertension Maternal Grandmother   . Heart disease Maternal Grandmother   . Mental illness Maternal Grandmother   . Diabetes Paternal Grandmother     Social History Social History   Tobacco Use  . Smoking status: Former Smoker    Last attempt to quit: 02/07/2014    Years since quitting: 3.2  . Smokeless tobacco: Never Used  Substance Use Topics  . Alcohol use: No    Alcohol/week: 0.0 oz  . Drug use: No     Allergies   Flagyl [metronidazole]  Review of Systems Review of Systems  Reason unable to perform ROS: See HPI as above.     Physical Exam Triage Vital Signs ED Triage Vitals  Enc Vitals Group     BP 05/13/17 1954 121/68     Pulse Rate 05/13/17 1954 95     Resp 05/13/17 1954 20     Temp 05/13/17 1954 98.6 F (37 C)     Temp src --      SpO2 05/13/17 1954 100 %     Weight --      Height --      Head Circumference --      Peak Flow --      Pain Score 05/13/17 1952 5     Pain Loc --      Pain Edu? --      Excl. in GC? --    No data found.  Updated Vital Signs BP 121/68   Pulse 95   Temp 98.6 F (37 C)   Resp 20   SpO2 100%   Breastfeeding? Yes   Physical Exam  Constitutional: She is oriented to person, place, and time. She appears well-developed and well-nourished. No distress.  HENT:  Head: Normocephalic and atraumatic.  Right Ear: Tympanic membrane, external ear and ear canal normal.  Tympanic membrane is not erythematous and not bulging.  Left Ear: Tympanic membrane, external ear and ear canal normal. Tympanic membrane is not erythematous and not bulging.  Nose: Right sinus exhibits maxillary sinus tenderness and frontal sinus tenderness. Left sinus exhibits maxillary sinus tenderness and frontal sinus tenderness.  Mouth/Throat: Uvula is midline, oropharynx is clear and moist and mucous membranes are normal.  Eyes: Pupils are equal, round, and reactive to light. Conjunctivae are normal.  Neck: Normal range of motion. Neck supple.  Cardiovascular: Normal rate, regular rhythm and normal heart sounds. Exam reveals no gallop and no friction rub.  No murmur heard. Pulmonary/Chest: Effort normal and breath sounds normal. She has no decreased breath sounds. She has no wheezes. She has no rhonchi. She has no rales.  Lymphadenopathy:    She has no cervical adenopathy.  Neurological: She is alert and oriented to person, place, and time.  Skin: Skin is warm and dry.  Psychiatric: She has a normal mood and affect. Her behavior is normal. Judgment normal.     UC Treatments / Results  Labs (all labs ordered are listed, but only abnormal results are displayed) Labs Reviewed - No data to display  EKG None Radiology No results found.  Procedures Procedures (including critical care time)  Medications Ordered in UC Medications - No data to display   Initial Impression / Assessment and Plan / UC Course  I have reviewed the triage vital signs and the nursing notes.  Pertinent labs & imaging results that were available during my care of the patient were reviewed by me and considered in my medical decision making (see chart for details).    Start Augmentin as directed for sinusitis.  Other symptomatic treatment discussed.  Push fluids.  Discussed with patient Augmentin will be present in breast milk, but will be safer infant.  Monitor for diarrhea, stomach upset, thrush given  exposure to Augmentin.  Discussed pumping and dumping breast milk to decrease exposure and side effects.  Return precautions given.  Final Clinical Impressions(s) / UC Diagnoses   Final diagnoses:  Acute non-recurrent pansinusitis    ED Discharge Orders        Ordered    amoxicillin-clavulanate (  AUGMENTIN) 875-125 MG tablet  Every 12 hours     05/13/17 2007    loratadine (CLARITIN) 10 MG tablet  Daily     05/13/17 2007    cromolyn (NASALCROM) 5.2 MG/ACT nasal spray  4 times daily     05/13/17 2007       Belinda FisherYu, Dimple Bastyr V, Cordelia Poche-C 05/13/17 2020

## 2017-05-13 NOTE — Discharge Instructions (Signed)
Start augmentin as directed for sinus infection. This will pass through the breast milk, though dosage is safe for infants. She may experience some stomach upset, thrush as side effect of the medicine. You can pump and dump breast milk after taking medicine to decrease exposure of the medicine. You can also take Claritin/cromolyn nasal spray to help with the symptoms. Follow up with PCP for further evaluation if symptoms not improving.

## 2017-05-13 NOTE — ED Triage Notes (Addendum)
Pt here for facial pain and congestion x 1 week. sts also some chest congestion. She took 2 tylenol PTA and some relief.

## 2017-07-07 ENCOUNTER — Encounter (HOSPITAL_COMMUNITY): Payer: Self-pay | Admitting: Emergency Medicine

## 2017-07-07 ENCOUNTER — Ambulatory Visit (HOSPITAL_COMMUNITY)
Admission: EM | Admit: 2017-07-07 | Discharge: 2017-07-07 | Disposition: A | Discharge status: Home / Self Care | Payer: Medicaid Other | Attending: Urgent Care | Admitting: Urgent Care

## 2017-07-07 ENCOUNTER — Ambulatory Visit (INDEPENDENT_AMBULATORY_CARE_PROVIDER_SITE_OTHER): Payer: Medicaid Other

## 2017-07-07 DIAGNOSIS — G8929 Other chronic pain: Secondary | ICD-10-CM | POA: Diagnosis not present

## 2017-07-07 DIAGNOSIS — M5136 Other intervertebral disc degeneration, lumbar region: Secondary | ICD-10-CM

## 2017-07-07 DIAGNOSIS — M545 Low back pain: Secondary | ICD-10-CM

## 2017-07-07 DIAGNOSIS — M62830 Muscle spasm of back: Secondary | ICD-10-CM | POA: Diagnosis not present
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
## 2017-07-07 MED ORDER — CYCLOBENZAPRINE HCL 5 MG PO TABS: 5.0000 mg | ORAL_TABLET | Freq: Three times a day (TID) | ORAL | 0 refills | Status: DC | PRN

## 2017-07-07 MED ORDER — NAPROXEN 500 MG PO TABS: 500.0000 mg | ORAL_TABLET | Freq: Two times a day (BID) | ORAL | 0 refills | Status: DC
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
## 2017-07-07 NOTE — Discharge Instructions (Signed)
Hydrate well with at least 2 liters (1 gallon) of water daily. Touch base with your PCP about a referral to an orthopedist if your back pain persists.

## 2017-07-07 NOTE — ED Triage Notes (Signed)
Pt states shes had back problems every since she had her two children, lower back pain, travels down to her knees. x3 years

## 2018-06-26 ENCOUNTER — Ambulatory Visit (INDEPENDENT_AMBULATORY_CARE_PROVIDER_SITE_OTHER)
Admission: RE | Admit: 2018-06-26 | Discharge: 2018-06-26 | Disposition: A | Discharge status: Home / Self Care | Payer: Medicaid Other | Source: Ambulatory Visit

## 2018-06-26 ENCOUNTER — Telehealth: Payer: Medicaid Other

## 2018-06-26 DIAGNOSIS — B9689 Other specified bacterial agents as the cause of diseases classified elsewhere: Secondary | ICD-10-CM | POA: Diagnosis not present

## 2018-06-26 DIAGNOSIS — K130 Diseases of lips: Secondary | ICD-10-CM

## 2018-06-26 MED ORDER — DOXYCYCLINE HYCLATE 100 MG PO CAPS: 100.0000 mg | ORAL_CAPSULE | Freq: Two times a day (BID) | ORAL | 0 refills | Status: AC

## 2018-06-26 MED ORDER — IBUPROFEN 800 MG PO TABS: 800.0000 mg | ORAL_TABLET | Freq: Three times a day (TID) | ORAL | 0 refills | Status: DC

## 2018-06-26 NOTE — Discharge Instructions (Addendum)
Begin doxycycline twice daily  Ibuprofen 800 mg every 8 hours and Tylenol [phone] every 4-6 hours with food  Warm compresses to lip  Follow up if not improving or worsening

## 2018-06-26 NOTE — ED Provider Notes (Signed)
Virtual Visit via Video Note:  Christy Bell  initiated request for Telemedicine visit with [facility] team. I connected with Christy Bell  on 06/26/2018 at 3:36 PM  for a synchronized telemedicine visit using a video enabled HIPPA compliant telemedicine application. I verified that I am speaking with Christy Bell  using two identifiers.  C , PA-C  was physically located in a [facility] Urgent care site and Christy Bell was located at a different location.   The limitations of evaluation and management by telemedicine as well as the availability of in-person appointments were discussed. Patient was informed that she  may incur a bill ( including co-pay) for this virtual visit encounter. Christy Bell  expressed understanding and gave verbal consent to proceed with virtual visit.     History of Present Illness:Christy Bell  is a 28 y.o. female presents with lesion to her lower lip.  States that over the past 5 days she has developed increased swelling and pain to her left lower lip.  She states that she initially thought this was as it, popped it and since has developed increased pain and swelling.  Denies history of cold sores although her mom has had a cold sore initially thought this what this was.  Denies lesions inside of mouth.  Denies difficulty swallowing.  Eating and drinking like normal.  Denies history of similar.  She has taken ibuprofen 400 mg for the discomfort which has not fully been helping.  Past Medical History:  Diagnosis Date  . Asthma   . Depression   . Gestational diabetes   . Hx of chlamydia infection   . Preeclampsia     Allergies  Allergen Reactions  . Flagyl [Metronidazole] Nausea And Vomiting    Observations/Objective:  Physical Exam  Constitutional: She is oriented to person, place, and time and well-developed, well-nourished, and in no distress. No distress.  HENT:  Head: Normocephalic and  atraumatic.  Nose: Nose normal.  Lower lip with swelling and erythema, patient holds thickened area of pain, cental scabbing lesion that appears to be coming to a point  No other lesions noted inside on buccal mucosa.  Eyes: Conjunctivae are normal.  Neck: Normal range of motion. Neck supple.  Pulmonary/Chest: Effort normal. No respiratory distress.  Musculoskeletal: Normal range of motion.  Neurological: She is alert and oriented to person, place, and time.        Assessment and Plan:  Potential lip abscess, lesion seems more concerning for bacterial infection versus viral cold sore.  At this time will initiate on doxycycline twice daily to cover for this as well as continue anti-inflammatories and warm compresses. Advised to follow-up in person for further evaluation if symptoms persisting.   Follow Up Instructions:    I discussed the assessment and treatment plan with the patient. The patient was provided an opportunity to ask questions and all were answered. The patient agreed with the plan and demonstrated an understanding of the instructions.   The patient was advised to call back or seek an in-person evaluation if the symptoms worsen or if the condition fails to improve as anticipated.     Lew Dawes, PA-C  06/26/2018 3:36 PM     Lew Dawes, PA-C 06/26/18 1609

## 2018-06-28 ENCOUNTER — Encounter (HOSPITAL_COMMUNITY): Payer: Self-pay | Admitting: Family Medicine

## 2018-06-28 ENCOUNTER — Ambulatory Visit (HOSPITAL_COMMUNITY)
Admission: EM | Admit: 2018-06-28 | Discharge: 2018-06-28 | Disposition: A | Discharge status: Home / Self Care | Payer: Medicaid Other | Attending: Family Medicine | Admitting: Family Medicine

## 2018-06-28 ENCOUNTER — Other Ambulatory Visit: Payer: Self-pay

## 2018-06-28 DIAGNOSIS — K130 Diseases of lips: Secondary | ICD-10-CM

## 2018-06-28 DIAGNOSIS — K122 Cellulitis and abscess of mouth: Secondary | ICD-10-CM | POA: Diagnosis not present

## 2018-06-28 MED ORDER — LIDOCAINE-EPINEPHRINE-TETRACAINE (LET) SOLUTION
NASAL | Status: AC
  Filled 2018-06-28: qty 3

## 2018-06-28 MED ORDER — LIDOCAINE-EPINEPHRINE-TETRACAINE (LET) SOLUTION
3.0000 mL | Freq: Once | NASAL | Status: AC
  Administered 2018-06-28: 3 mL via TOPICAL

## 2018-06-28 NOTE — Discharge Instructions (Addendum)
Apply moist warm compresses every couple hours while awake and massage the lip for the next day or so.  Stay on your antibiotic  Return if lip swelling returns.

## 2018-06-28 NOTE — ED Provider Notes (Addendum)
[facility]    CSN: [id number] Arrival date & time: 06/28/18  1722     History   Chief Complaint Chief Complaint  Patient presents with  . Oral Swelling    HPI Christy Bell is a 28 y.o. female.   28 yo woman with left lower lip swelling.  She "tried to pop a zit" about a week ago on the outside of her right lower lip and the lip has gradually become more swollen and tender since.  No drainage. Started on antibiotics two days ago but still swelling more.    From video visit 06/26/18: History of Present Illness:Christy Bell  is a 28 y.o. female presents with lesion to her lower lip.  States that over the past 5 days she has developed increased swelling and pain to her left lower lip.  She states that she initially thought this was as it, popped it and since has developed increased pain and swelling.  Denies history of cold sores although her mom has had a cold sore initially thought this what this was.  Denies lesions inside of mouth.  Denies difficulty swallowing.  Eating and drinking like normal.  Denies history of similar.  She has taken ibuprofen 400 mg for the discomfort which has not fully been helping.     Past Medical History:  Diagnosis Date  . Asthma   . Depression   . Gestational diabetes   . Hx of chlamydia infection   . Preeclampsia     Patient Active Problem List   Diagnosis Date Noted  . Status post repeat low transverse cesarean section 02/26/2017    Past Surgical History:  Procedure Laterality Date  . CESAREAN SECTION N/A 09/07/2014   Procedure: CESAREAN SECTION;  Surgeon: Essie Hart, MD;  Location: [facility];  Service: Obstetrics;  Laterality: N/A;  . CESAREAN SECTION N/A 02/26/2017   Procedure: REPEAT CESAREAN SECTION;  Surgeon: Essie Hart, MD;  Location: [facility];  Service: Obstetrics;  Laterality: N/A;  Tracey RNFA    OB History    Gravida  2   Para  1   Term      Preterm  1   AB      Living  1     SAB       TAB      Ectopic      Multiple  0   Live Births  1            Home Medications    Prior to Admission medications   Medication Sig Start Date End Date Taking? Authorizing Provider  acetaminophen (TYLENOL) 325 MG tablet Take 650 mg by mouth every 6 (six) hours as needed for moderate pain or headache.    [provider]  cromolyn (NASALCROM) 5.2 MG/ACT nasal spray Place 1 spray into both nostrils 4 (four) times daily. 05/13/17   Cathie Hoops, Amy V, PA-C  cyclobenzaprine (FLEXERIL) 5 MG tablet Take 1 tablet (5 mg total) by mouth 3 (three) times daily as needed for muscle spasms. 07/07/17   Wallis Bamberg, PA-C  doxycycline (VIBRAMYCIN) 100 MG capsule Take 1 capsule (100 mg total) by mouth 2 (two) times daily for 10 days. With food 06/26/18 07/06/18  Dahlia Byes A, NP  ibuprofen (ADVIL) 800 MG tablet Take 1 tablet (800 mg total) by mouth 3 (three) times daily. With food 06/26/18   Dahlia Byes A, NP  loratadine (CLARITIN) 10 MG tablet Take 1 tablet (10 mg total) by mouth daily. 05/13/17  Cathie Hoops, Amy V, PA-C  naproxen (NAPROSYN) 500 MG tablet Take 1 tablet (500 mg total) by mouth 2 (two) times daily. 07/07/17   Wallis Bamberg, PA-C  sertraline (ZOLOFT) 50 MG tablet Take 50 mg by mouth daily.    [provider]    Family History Family History  Problem Relation Age of Onset  . Mental illness Father   . Hypertension Maternal Grandmother   . Heart disease Maternal Grandmother   . Mental illness Maternal Grandmother   . Diabetes Paternal Grandmother     Social History Social History   Tobacco Use  . Smoking status: Former Smoker    Last attempt to quit: 02/07/2014    Years since quitting: 4.3  . Smokeless tobacco: Never Used  Substance Use Topics  . Alcohol use: No    Alcohol/week: 0.0 standard drinks  . Drug use: No     Allergies   Flagyl [metronidazole]   Review of Systems Review of Systems   Physical Exam Triage Vital Signs ED Triage Vitals  Enc Vitals Group     BP       Pulse      Resp      Temp      Temp src      SpO2      Weight      Height      Head Circumference      Peak Flow      Pain Score      Pain Loc      Pain Edu?      Excl. in GC?    No data found.  Updated Vital Signs BP 140/79 (BP Location: Right Arm)   Pulse 97   Temp 98.7 F (37.1 C) (Oral)   Resp 16   SpO2 99%    Physical Exam Vitals signs and nursing note reviewed.  Constitutional:      Appearance: Normal appearance.  HENT:     Mouth/Throat:     Comments: Firm tender right lower lip abscess. Neurological:     Mental Status: She is alert.        UC Treatments / Results  Labs (all labs ordered are listed, but only abnormal results are displayed) Labs Reviewed - No data to display  EKG None  Radiology No results found.  Procedures Incision and Drainage Date/Time: 06/28/2018 6:27 PM Performed by: Elvina Sidle, MD Authorized by: Elvina Sidle, MD   Consent:    Consent obtained:  Verbal   Consent given by:  Patient   Risks discussed:  Infection Location:    Type:  Abscess   Location:  Mouth Anesthesia (see MAR for exact dosages):    Anesthesia method:  Topical application and local infiltration   Topical anesthetic:  LET   Local anesthetic:  Lidocaine 2% WITH epi Procedure type:    Complexity:  Simple Procedure details:    Needle aspiration: no     Incision types:  Stab incision   Incision depth:  Submucosal   Scalpel blade:  11   Wound management:  Probed and deloculated   Drainage:  Purulent   Drainage amount:  Moderate   Wound treatment:  Wound left open   Packing materials:  None Post-procedure details:    Patient tolerance of procedure:  Tolerated well, no immediate complications   (including critical care time)  Medications Ordered in UC Medications  lidocaine-EPINEPHrine-tetracaine (LET) solution (3 mLs Topical Given 06/28/18 1759)    Initial Impression / Assessment and Plan /  UC Course  I have reviewed the triage  vital signs and the nursing notes.  Pertinent labs & imaging results that were available during my care of the patient were reviewed by me and considered in my medical decision making (see chart for details).    Final Clinical Impressions(s) / UC Diagnoses   Final diagnoses:  Lip abscess     Discharge Instructions     Apply moist warm compresses every couple hours while awake and massage the lip for the next day or so.  Stay on your antibiotic  Return if lip swelling returns.    ED Prescriptions    None     Controlled Substance Prescriptions [place] Controlled Substance Registry consulted? Not Applicable   Elvina SidleLauenstein, Akbar Sacra, MD 06/28/18 Domingo [id number]    Runa Whittingham, MD 06/28/18 [phone]

## 2018-06-28 NOTE — ED Triage Notes (Signed)
Per pt she had a small area on her lip on Monday of this week and was placed on an antibiotic but it has gotten worse. Pt does have a swollen area on her right bottom lip. No fevers no chills Says the area hurts and runs into her jaw and right ear.

## 2019-07-11 ENCOUNTER — Telehealth: Payer: Medicaid Other

## 2019-08-07 ENCOUNTER — Ambulatory Visit (INDEPENDENT_AMBULATORY_CARE_PROVIDER_SITE_OTHER)
Admission: RE | Admit: 2019-08-07 | Discharge: 2019-08-07 | Disposition: A | Discharge status: Home / Self Care | Payer: Medicaid Other | Source: Ambulatory Visit

## 2019-08-07 ENCOUNTER — Other Ambulatory Visit: Payer: Self-pay

## 2019-08-07 DIAGNOSIS — R21 Rash and other nonspecific skin eruption: Secondary | ICD-10-CM

## 2019-08-07 MED ORDER — CLOTRIMAZOLE-BETAMETHASONE 1-0.05 % EX CREA: TOPICAL_CREAM | CUTANEOUS | 0 refills | Status: DC

## 2019-08-07 NOTE — ED Provider Notes (Signed)
Virtual Visit via Video Note:  Christy Bell  initiated request for Telemedicine visit with [facility] team. I connected with Christy Bell  on 08/07/2019 at 10:45 AM  for a synchronized telemedicine visit using a video enabled HIPPA compliant telemedicine application. I verified that I am speaking with Christy Bell  using two identifiers. Mickie Bail, NP  was physically located in a [facility] Urgent care site and [facility] was located at a different location.   The limitations of evaluation and management by telemedicine as well as the availability of in-person appointments were discussed. Patient was informed that she  may incur a bill ( including co-pay) for this virtual visit encounter. Christy Bell  expressed understanding and gave verbal consent to proceed with virtual visit.     History of Present Illness:Christy Bell  is a 29 y.o. female presents for evaluation of recurrent pruritic rash on her left breast x 2 days.  She states the area "stings" slightly.   The rash has been present off and on x 2 years.  No known cause for reappearance of the rash and it resolves without treatment usually.  No treatment attempted at home.  She states she was seen by her OB/GYN for the rash 2 years ago; diagnosed as fungal and treated with a cream.  She denies fever, chills, ear pain, sore throat, cough, SOB, abdominal pain, n/v/d, other rash, or other symptoms.  She denies current pregnancy or breastfeeding.      Allergies  Allergen Reactions  . Flagyl [Metronidazole] Nausea And Vomiting     Past Medical History:  Diagnosis Date  . Asthma   . Depression   . Gestational diabetes   . Hx of chlamydia infection   . Preeclampsia      Social History   Tobacco Use  . Smoking status: Former Smoker    Quit date: 02/07/2014    Years since quitting: 5.4  . Smokeless tobacco: Never Used  Substance Use Topics  . Alcohol use: No    Alcohol/week:  0.0 standard drinks  . Drug use: No    ROS: as stated in HPI.  All other systems reviewed and negative.     Observations/Objective: Physical Exam  VITALS: Patient denies fever. GENERAL: Alert, appears well and in no acute distress. HEENT: Atraumatic. Oral mucosa appears moist. NECK: Normal movements of the head and neck. CARDIOPULMONARY: No increased WOB. Speaking in clear sentences. I:E ratio WNL.  MS: Moves all visible extremities without noticeable abnormality. PSYCH: Pleasant and cooperative, well-groomed. Speech normal rate and rhythm. Affect is appropriate. Insight and judgement are appropriate. Attention is focused, linear, and appropriate.  NEURO: CN grossly intact. Oriented as arrived to appointment on time with no prompting. Moves both UE equally.  SKIN:  Mildly erythematous patchy rash on side of left breast.    Assessment and Plan:    ICD-10-CM   1. Rash  R21        Follow Up Instructions: Discussed limitations of video visit for diagnosing and treating a rash.  Treating with Lotrisone cream.  Education provided about rashes.  Instructed patient to follow-up with her PCP if her symptoms are not improving.  Patient agrees to plan of care.      I discussed the assessment and treatment plan with the patient. The patient was provided an opportunity to ask questions and all were answered. The patient agreed with the plan and demonstrated an understanding of the instructions.   The patient was advised  to call back or seek an in-person evaluation if the symptoms worsen or if the condition fails to improve as anticipated.      Mickie Bail, NP  08/07/2019 10:45 AM         Mickie Bail, NP 08/07/19 1045

## 2019-08-07 NOTE — Discharge Instructions (Signed)
Use the cream as directed.  Follow up with your primary care provider if your symptoms are not improving.    

## 2019-10-17 ENCOUNTER — Other Ambulatory Visit: Payer: Self-pay | Admitting: General Surgery

## 2019-10-19 ENCOUNTER — Other Ambulatory Visit: Payer: Self-pay | Admitting: General Surgery

## 2019-10-26 ENCOUNTER — Other Ambulatory Visit (HOSPITAL_COMMUNITY): Payer: Medicaid Other

## 2019-10-26 ENCOUNTER — Other Ambulatory Visit (HOSPITAL_COMMUNITY)
Admission: RE | Admit: 2019-10-26 | Discharge: 2019-10-26 | Disposition: A | Discharge status: Home / Self Care | Payer: Medicaid Other | Source: Ambulatory Visit | Attending: General Surgery | Admitting: General Surgery

## 2019-10-26 DIAGNOSIS — Z01812 Encounter for preprocedural laboratory examination: Secondary | ICD-10-CM | POA: Insufficient documentation

## 2019-10-26 DIAGNOSIS — Z20822 Contact with and (suspected) exposure to covid-19: Secondary | ICD-10-CM | POA: Insufficient documentation

## 2019-10-26 LAB — SARS CORONAVIRUS 2 (TAT 6-24 HRS): SARS Coronavirus 2: NEGATIVE

## 2019-10-26 NOTE — Patient Instructions (Addendum)
DUE TO COVID-19 ONLY ONE VISITOR IS ALLOWED TO COME WITH YOU AND STAY IN THE WAITING ROOM ONLY DURING  PRE OP AND PROCEDURE.   IF YOU WILL BE ADMITTED INTO THE HOSPITAL YOU ARE ALLOWED ONE SUPPORT PERSON DURING VISITATION HOURS  ONLY (10AM -8PM)   . The support person may change daily. . The support person must pass our screening, gel in and out, and wear a mask at all times, including in the patient's room. . Patients must also wear a mask when staff or their support person are in the room.   COVID SWAB TESTING MUST BE COMPLETED ON:  Tuesday, 10-27-19 @ 10:40 AM   [address] [place], [place] [address]  (Must self quarantine after testing. Follow instructions on handout.)    Your procedure is scheduled on:  Thursday, 10-29-19   Report to [facility] Main  Entrance   Report to Short Stay at 5:30 AM   [facility])    Call this number if you have problems the morning of surgery [phone]   Do not eat food :After Midnight.   May have liquids until 4:30 AM day of surgery  CLEAR LIQUID DIET  Foods Allowed                                                                     Foods Excluded  Water, Black Coffee and tea, regular and decaf            liquids that you cannot  Plain Jell-O in any flavor  (No red)                                   see through such as: Fruit ices (not with fruit pulp)                                      milk, soups, orange juice              Iced Popsicles (No red)                                      All solid food                                   Apple juices Sports drinks like Gatorade (No red) Lightly seasoned clear broth or consume(fat free) Sugar, honey syrup    Oral Hygiene is also important to reduce your risk of infection.                                    Remember - BRUSH YOUR TEETH THE MORNING OF SURGERY WITH YOUR REGULAR TOOTHPASTE   Do NOT smoke after Midnight   Take these medicines the morning of surgery with A SIP OF  WATER:  Cetirizine, Sertraline and may use inhalers.  Bring inhalers with  you day of surgery                                You may not have any metal on your body including hair pins, jewelry, and body piercings             Do not wear make-up, lotions, powders, perfumes/cologne, or deodorant             Do not wear nail polish.  Do not shave  48 hours prior to surgery.               Do not bring valuables to the hospital. [facility] IS NOT  RESPONSIBLE   FOR VALUABLES.   Contacts, dentures or bridgework may not be worn into surgery.      Patients discharged the day of surgery will not be allowed to drive home.                 Please read over the following fact sheets you were given: IF YOU HAVE QUESTIONS ABOUT YOUR PRE OP INSTRUCTIONS  PLEASE CALL [phone]   [facility] - Preparing for Surgery Before surgery, you can play an important role.  Because skin is not sterile, your skin needs to be as free of germs as possible.  You can reduce the number of germs on your skin by washing with CHG (chlorahexidine gluconate) soap before surgery.  CHG is an antiseptic cleaner which kills germs and bonds with the skin to continue killing germs even after washing. Please DO NOT use if you have an allergy to CHG or antibacterial soaps.  If your skin becomes reddened/irritated stop using the CHG and inform your nurse when you arrive at Short Stay. Do not shave (including legs and underarms) for at least 48 hours prior to the first CHG shower.  You may shave your face/neck.  Please follow these instructions carefully:  1.  Shower with CHG Soap the night before surgery and the  morning of surgery.  2.  If you choose to wash your hair, wash your hair first as usual with your normal  shampoo.  3.  After you shampoo, rinse your hair and body thoroughly to remove the shampoo.                             4.  Use CHG as you would any other liquid soap.  You can apply chg directly to the skin and wash.   Gently with a scrungie or clean washcloth.  5.  Apply the CHG Soap to your body ONLY FROM THE NECK DOWN.   Do   not use on face/ open                           Wound or open sores. Avoid contact with eyes, ears mouth and   genitals (private parts).                       Wash face,  Genitals (private parts) with your normal soap.             6.  Wash thoroughly, paying special attention to the area where your    surgery  will be performed.  7.  Thoroughly rinse your body with warm water from the neck down.  8.  DO NOT  shower/wash with your normal soap after using and rinsing off the CHG Soap.                9.  Pat yourself dry with a clean towel.            10.  Wear clean pajamas.            11.  Place clean sheets on your bed the night of your first shower and do not  sleep with pets. Day of Surgery : Do not apply any lotions/deodorants the morning of surgery.  Please wear clean clothes to the hospital/surgery center.  FAILURE TO FOLLOW THESE INSTRUCTIONS MAY RESULT IN THE CANCELLATION OF YOUR SURGERY  PATIENT SIGNATURE_________________________________  NURSE SIGNATURE__________________________________  ________________________________________________________________________

## 2019-10-26 NOTE — Progress Notes (Addendum)
COVID Vaccine Completed:  No Date COVID Vaccine completed: COVID vaccine manufacturer: Pfizer    Moderna   [employer]   PCP - Charlies Silvers, MD at [facility] Cardiologist - N/A  Chest x-ray -  EKG -  Stress Test -  ECHO -  Cardiac Cath -  Pacemaker/ICD device last checked:  Sleep Study -  CPAP -   Fasting Blood Sugar -  Checks Blood Sugar _____ times a day  Blood Thinner Instructions: Aspirin Instructions: Last Dose:  Anesthesia review:   Patient denies shortness of breath, fever, cough and chest pain at PAT appointment   Patient verbalized understanding of instructions that were given to them at the PAT appointment. Patient was also instructed that they will need to review over the PAT instructions again at home before surgery.

## 2019-10-27 ENCOUNTER — Encounter (HOSPITAL_COMMUNITY): Payer: Self-pay

## 2019-10-27 ENCOUNTER — Other Ambulatory Visit: Payer: Self-pay

## 2019-10-27 ENCOUNTER — Other Ambulatory Visit (HOSPITAL_COMMUNITY): Payer: Medicaid Other

## 2019-10-27 ENCOUNTER — Encounter (HOSPITAL_COMMUNITY)
Admission: RE | Admit: 2019-10-27 | Discharge: 2019-10-27 | Disposition: A | Discharge status: Home / Self Care | Payer: Medicaid Other | Source: Ambulatory Visit | Attending: General Surgery | Admitting: General Surgery

## 2019-10-27 DIAGNOSIS — Z01812 Encounter for preprocedural laboratory examination: Secondary | ICD-10-CM | POA: Diagnosis present

## 2019-10-27 HISTORY — DX: Anxiety disorder, unspecified: F41.9

## 2019-10-27 HISTORY — DX: Gastro-esophageal reflux disease without esophagitis: K21.9

## 2019-10-27 HISTORY — DX: Headache, unspecified: R51.9

## 2019-10-27 HISTORY — DX: Bronchitis, not specified as acute or chronic: J40

## 2019-10-27 LAB — CBC WITH DIFFERENTIAL/PLATELET
Abs Immature Granulocytes: 0.02 10*3/uL (ref 0.00–0.07)
Basophils Absolute: 0 10*3/uL (ref 0.0–0.1)
Basophils Relative: 1 %
Eosinophils Absolute: 0 10*3/uL (ref 0.0–0.5)
Eosinophils Relative: 1 %
HCT: 43.8 % (ref 36.0–46.0)
Hemoglobin: 14.5 g/dL (ref 12.0–15.0)
Immature Granulocytes: 0 %
Lymphocytes Relative: 30 %
Lymphs Abs: 2 10*3/uL (ref 0.7–4.0)
MCH: 29.2 pg (ref 26.0–34.0)
MCHC: 33.1 g/dL (ref 30.0–36.0)
MCV: 88.1 fL (ref 80.0–100.0)
Monocytes Absolute: 0.2 10*3/uL (ref 0.1–1.0)
Monocytes Relative: 4 %
Neutro Abs: 4.3 10*3/uL (ref 1.7–7.7)
Neutrophils Relative %: 64 %
Platelets: 258 10*3/uL (ref 150–400)
RBC: 4.97 MIL/uL (ref 3.87–5.11)
RDW: 13.6 % (ref 11.5–15.5)
WBC: 6.6 10*3/uL (ref 4.0–10.5)
nRBC: 0 % (ref 0.0–0.2)

## 2019-10-27 LAB — COMPREHENSIVE METABOLIC PANEL
ALT: 29 U/L (ref 0–44)
AST: 23 U/L (ref 15–41)
Albumin: 4.5 g/dL (ref 3.5–5.0)
Alkaline Phosphatase: 56 U/L (ref 38–126)
Anion gap: 14 (ref 5–15)
BUN: 9 mg/dL (ref 6–20)
CO2: 23 mmol/L (ref 22–32)
Calcium: 9.3 mg/dL (ref 8.9–10.3)
Chloride: 102 mmol/L (ref 98–111)
Creatinine, Ser: 0.62 mg/dL (ref 0.44–1.00)
GFR calc Af Amer: >60 mL/min (ref >60)
GFR calc non Af Amer: >60 mL/min (ref >60)
Glucose, Bld: 87 mg/dL (ref 70–99)
Potassium: 4.1 mmol/L (ref 3.5–5.1)
Sodium: 139 mmol/L (ref 135–145)
Total Bilirubin: 0.7 mg/dL (ref 0.3–1.2)
Total Protein: 7.5 g/dL (ref 6.5–8.1)

## 2019-10-27 LAB — PROTIME-INR
INR: 1 (ref 0.8–1.2)
Prothrombin Time: 12.8 seconds (ref 11.4–15.2)

## 2019-10-28 NOTE — Anesthesia Preprocedure Evaluation (Addendum)
Anesthesia Evaluation  Patient identified by MRN, date of birth, ID band Patient awake    Reviewed: Allergy & Precautions, NPO status , Patient's Chart, lab work & pertinent test results  Airway Mallampati: II  TM Distance: >3 FB Neck ROM: Full    Dental no notable dental hx. (+) Teeth Intact, Dental Advisory Given   Pulmonary asthma , former smoker,    Pulmonary exam normal breath sounds clear to auscultation       Cardiovascular Exercise Tolerance: Good Normal cardiovascular exam Rhythm:Regular Rate:Normal     Neuro/Psych  Headaches, Depression    GI/Hepatic Neg liver ROS, GERD  Controlled,  Endo/Other    Renal/GU negative Renal ROSK+ 4.1 Cr 0.62     Musculoskeletal negative musculoskeletal ROS (+)   Abdominal (+) + obese,   Peds  Hematology negative hematology ROS (+) Hgb 14.5   Anesthesia Other Findings   Reproductive/Obstetrics negative OB ROS                          Anesthesia Physical Anesthesia Plan  ASA: III  Anesthesia Plan: General   Post-op Pain Management:    Induction: Intravenous  PONV Risk Score and Plan: Treatment may vary due to age or medical condition, Midazolam, Dexamethasone and Ondansetron  Airway Management Planned: Oral ETT  Additional Equipment: None  Intra-op Plan:   Post-operative Plan: Extubation in OR  Informed Consent: I have reviewed the patients History and Physical, chart, labs and discussed the procedure including the risks, benefits and alternatives for the proposed anesthesia with the patient or authorized representative who has indicated his/her understanding and acceptance.     Dental advisory given  Plan Discussed with: CRNA and Anesthesiologist  Anesthesia Plan Comments:        Anesthesia Quick Evaluation

## 2019-10-29 ENCOUNTER — Ambulatory Visit (HOSPITAL_COMMUNITY)
Admission: RE | Admit: 2019-10-29 | Discharge: 2019-10-29 | Disposition: A | Discharge status: Home / Self Care | Payer: Medicaid Other | Attending: General Surgery | Admitting: General Surgery

## 2019-10-29 ENCOUNTER — Encounter (HOSPITAL_COMMUNITY): Payer: Self-pay | Admitting: General Surgery

## 2019-10-29 ENCOUNTER — Ambulatory Visit (HOSPITAL_COMMUNITY): Payer: Medicaid Other | Admitting: Anesthesiology

## 2019-10-29 ENCOUNTER — Encounter (HOSPITAL_COMMUNITY)
Admission: RE | Disposition: A | Discharge status: Home / Self Care | Payer: Self-pay | Source: Home / Self Care | Attending: General Surgery

## 2019-10-29 DIAGNOSIS — K760 Fatty (change of) liver, not elsewhere classified: Secondary | ICD-10-CM | POA: Insufficient documentation

## 2019-10-29 DIAGNOSIS — Z7951 Long term (current) use of inhaled steroids: Secondary | ICD-10-CM | POA: Diagnosis not present

## 2019-10-29 DIAGNOSIS — Z79899 Other long term (current) drug therapy: Secondary | ICD-10-CM | POA: Diagnosis not present

## 2019-10-29 DIAGNOSIS — F329 Major depressive disorder, single episode, unspecified: Secondary | ICD-10-CM | POA: Diagnosis not present

## 2019-10-29 DIAGNOSIS — Z87891 Personal history of nicotine dependence: Secondary | ICD-10-CM | POA: Diagnosis not present

## 2019-10-29 DIAGNOSIS — J45909 Unspecified asthma, uncomplicated: Secondary | ICD-10-CM | POA: Diagnosis not present

## 2019-10-29 DIAGNOSIS — K8010 Calculus of gallbladder with chronic cholecystitis without obstruction: Secondary | ICD-10-CM | POA: Diagnosis present

## 2019-10-29 DIAGNOSIS — F419 Anxiety disorder, unspecified: Secondary | ICD-10-CM | POA: Diagnosis not present

## 2019-10-29 HISTORY — PX: CHOLECYSTECTOMY: SHX55

## 2019-10-29 LAB — PREGNANCY, URINE: Preg Test, Ur: NEGATIVE

## 2019-10-29 SURGERY — LAPAROSCOPIC CHOLECYSTECTOMY WITH INTRAOPERATIVE CHOLANGIOGRAM
Anesthesia: General

## 2019-10-29 MED ORDER — SUCCINYLCHOLINE CHLORIDE 200 MG/10ML IV SOSY
PREFILLED_SYRINGE | INTRAVENOUS | Status: AC
  Filled 2019-10-29: qty 10

## 2019-10-29 MED ORDER — LACTATED RINGERS IV SOLN: INTRAVENOUS | Status: DC

## 2019-10-29 MED ORDER — ORAL CARE MOUTH RINSE: 15.0000 mL | Freq: Once | OROMUCOSAL | Status: AC

## 2019-10-29 MED ORDER — LIDOCAINE HCL (PF) 2 % IJ SOLN
INTRAMUSCULAR | Status: DC | PRN
  Administered 2019-10-29: 1.5 mg/kg/h via INTRADERMAL

## 2019-10-29 MED ORDER — CEFAZOLIN SODIUM-DEXTROSE 2-4 GM/100ML-% IV SOLN
2.0000 g | INTRAVENOUS | Status: AC
  Administered 2019-10-29: 2 g via INTRAVENOUS
  Filled 2019-10-29: qty 100

## 2019-10-29 MED ORDER — DEXAMETHASONE SODIUM PHOSPHATE 10 MG/ML IJ SOLN
INTRAMUSCULAR | Status: DC | PRN
  Administered 2019-10-29: 8 mg via INTRAVENOUS

## 2019-10-29 MED ORDER — ONDANSETRON HCL 4 MG/2ML IJ SOLN
4.0000 mg | Freq: Once | INTRAMUSCULAR | Status: AC | PRN
  Administered 2019-10-29: 4 mg via INTRAVENOUS

## 2019-10-29 MED ORDER — KETOROLAC TROMETHAMINE 30 MG/ML IJ SOLN: 30.0000 mg | Freq: Once | INTRAMUSCULAR | Status: DC | PRN

## 2019-10-29 MED ORDER — PROPOFOL 10 MG/ML IV BOLUS
INTRAVENOUS | Status: AC
  Filled 2019-10-29: qty 40

## 2019-10-29 MED ORDER — PROPOFOL 10 MG/ML IV BOLUS
INTRAVENOUS | Status: DC | PRN
  Administered 2019-10-29: 180 mg via INTRAVENOUS

## 2019-10-29 MED ORDER — CHLORHEXIDINE GLUCONATE 0.12 % MT SOLN
15.0000 mL | Freq: Once | OROMUCOSAL | Status: AC
  Administered 2019-10-29: 15 mL via OROMUCOSAL

## 2019-10-29 MED ORDER — MIDAZOLAM HCL 5 MG/5ML IJ SOLN
INTRAMUSCULAR | Status: DC | PRN
  Administered 2019-10-29: 2 mg via INTRAVENOUS

## 2019-10-29 MED ORDER — AMISULPRIDE (ANTIEMETIC) 5 MG/2ML IV SOLN
10.0000 mg | Freq: Once | INTRAVENOUS | Status: AC
  Administered 2019-10-29: 10 mg via INTRAVENOUS

## 2019-10-29 MED ORDER — FENTANYL CITRATE (PF) 100 MCG/2ML IJ SOLN
INTRAMUSCULAR | Status: DC | PRN
Start: 2019-10-29 — End: 2019-10-29
  Administered 2019-10-29: 100 ug via INTRAVENOUS
  Administered 2019-10-29 (×4): 25 ug via INTRAVENOUS

## 2019-10-29 MED ORDER — BUPIVACAINE-EPINEPHRINE (PF) 0.25% -1:200000 IJ SOLN
INTRAMUSCULAR | Status: DC | PRN
  Administered 2019-10-29: 5 mL

## 2019-10-29 MED ORDER — LIDOCAINE 2% (20 MG/ML) 5 ML SYRINGE
INTRAMUSCULAR | Status: AC
  Filled 2019-10-29: qty 5

## 2019-10-29 MED ORDER — ONDANSETRON HCL 4 MG/2ML IJ SOLN
INTRAMUSCULAR | Status: AC
  Filled 2019-10-29: qty 2

## 2019-10-29 MED ORDER — ACETAMINOPHEN 500 MG PO TABS
1000.0000 mg | ORAL_TABLET | ORAL | Status: AC
  Administered 2019-10-29: 1000 mg via ORAL
  Filled 2019-10-29: qty 2

## 2019-10-29 MED ORDER — DEXAMETHASONE SODIUM PHOSPHATE 10 MG/ML IJ SOLN
INTRAMUSCULAR | Status: AC
  Filled 2019-10-29: qty 1

## 2019-10-29 MED ORDER — OXYCODONE HCL 5 MG PO TABS
ORAL_TABLET | ORAL | Status: AC
  Filled 2019-10-29: qty 1

## 2019-10-29 MED ORDER — CHLORHEXIDINE GLUCONATE CLOTH 2 % EX PADS: 6.0000 | MEDICATED_PAD | Freq: Once | CUTANEOUS | Status: DC

## 2019-10-29 MED ORDER — ROCURONIUM BROMIDE 10 MG/ML (PF) SYRINGE
PREFILLED_SYRINGE | INTRAVENOUS | Status: AC
  Filled 2019-10-29: qty 10

## 2019-10-29 MED ORDER — ONDANSETRON HCL 4 MG/2ML IJ SOLN
INTRAMUSCULAR | Status: DC | PRN
  Administered 2019-10-29: 4 mg via INTRAVENOUS

## 2019-10-29 MED ORDER — LACTATED RINGERS IR SOLN
Status: DC | PRN
  Administered 2019-10-29: 1000 mL

## 2019-10-29 MED ORDER — KETOROLAC TROMETHAMINE 30 MG/ML IJ SOLN
INTRAMUSCULAR | Status: DC | PRN
  Administered 2019-10-29: 30 mg via INTRAVENOUS

## 2019-10-29 MED ORDER — DEXMEDETOMIDINE (PRECEDEX) IN NS 20 MCG/5ML (4 MCG/ML) IV SYRINGE
PREFILLED_SYRINGE | INTRAVENOUS | Status: AC
  Filled 2019-10-29: qty 5

## 2019-10-29 MED ORDER — HYDROMORPHONE HCL 1 MG/ML IJ SOLN
0.2500 mg | INTRAMUSCULAR | Status: DC | PRN
  Administered 2019-10-29: 0.5 mg via INTRAVENOUS

## 2019-10-29 MED ORDER — KETAMINE HCL 10 MG/ML IJ SOLN
INTRAMUSCULAR | Status: AC
  Filled 2019-10-29: qty 1

## 2019-10-29 MED ORDER — LIDOCAINE HCL (CARDIAC) PF 100 MG/5ML IV SOSY
PREFILLED_SYRINGE | INTRAVENOUS | Status: DC | PRN
  Administered 2019-10-29: 80 mg via INTRAVENOUS

## 2019-10-29 MED ORDER — BUPIVACAINE-EPINEPHRINE (PF) 0.25% -1:200000 IJ SOLN
INTRAMUSCULAR | Status: AC
  Filled 2019-10-29: qty 30

## 2019-10-29 MED ORDER — OXYCODONE HCL 5 MG PO TABS
5.0000 mg | ORAL_TABLET | Freq: Once | ORAL | Status: AC | PRN
  Administered 2019-10-29: 5 mg via ORAL

## 2019-10-29 MED ORDER — HYDROMORPHONE HCL 1 MG/ML IJ SOLN
INTRAMUSCULAR | Status: AC
  Administered 2019-10-29: 0.5 mg via INTRAVENOUS
  Filled 2019-10-29: qty 1

## 2019-10-29 MED ORDER — LIDOCAINE HCL (PF) 1 % IJ SOLN
INTRAMUSCULAR | Status: AC
  Filled 2019-10-29: qty 30

## 2019-10-29 MED ORDER — ROCURONIUM BROMIDE 100 MG/10ML IV SOLN
INTRAVENOUS | Status: DC | PRN
  Administered 2019-10-29: 20 mg via INTRAVENOUS
  Administered 2019-10-29: 60 mg via INTRAVENOUS

## 2019-10-29 MED ORDER — FENTANYL CITRATE (PF) 100 MCG/2ML IJ SOLN
INTRAMUSCULAR | Status: AC
  Filled 2019-10-29: qty 2

## 2019-10-29 MED ORDER — MIDAZOLAM HCL 2 MG/2ML IJ SOLN
INTRAMUSCULAR | Status: AC
  Filled 2019-10-29: qty 2

## 2019-10-29 MED ORDER — AMISULPRIDE (ANTIEMETIC) 5 MG/2ML IV SOLN
INTRAVENOUS | Status: AC
  Filled 2019-10-29: qty 2

## 2019-10-29 MED ORDER — KETOROLAC TROMETHAMINE 15 MG/ML IJ SOLN
15.0000 mg | INTRAMUSCULAR | Status: AC
  Administered 2019-10-29: 15 mg via INTRAVENOUS
  Filled 2019-10-29: qty 1

## 2019-10-29 MED ORDER — OXYCODONE HCL 5 MG/5ML PO SOLN: 5.0000 mg | Freq: Once | ORAL | Status: AC | PRN

## 2019-10-29 MED ORDER — LIDOCAINE HCL (PF) 1 % IJ SOLN
INTRAMUSCULAR | Status: DC | PRN
  Administered 2019-10-29: 5 mL

## 2019-10-29 MED ORDER — DEXMEDETOMIDINE (PRECEDEX) IN NS 20 MCG/5ML (4 MCG/ML) IV SYRINGE
PREFILLED_SYRINGE | INTRAVENOUS | Status: DC | PRN
  Administered 2019-10-29: 20 ug via INTRAVENOUS

## 2019-10-29 MED ORDER — OXYCODONE HCL 5 MG PO TABS: 5.0000 mg | ORAL_TABLET | Freq: Four times a day (QID) | ORAL | 0 refills | Status: DC | PRN

## 2019-10-29 MED ORDER — SUGAMMADEX SODIUM 200 MG/2ML IV SOLN
INTRAVENOUS | Status: DC | PRN
  Administered 2019-10-29: 200 mg via INTRAVENOUS

## 2019-10-29 SURGICAL SUPPLY — 42 items
APPLIER CLIP ROT 10 11.4 M/L (STAPLE) ×3
CABLE HIGH FREQUENCY MONO STRZ (ELECTRODE) ×3 IMPLANT
CHLORAPREP W/TINT 26 (MISCELLANEOUS) ×3 IMPLANT
CLIP APPLIE ROT 10 11.4 M/L (STAPLE) ×1 IMPLANT
CLIP VESOLOCK MED LG 6/CT (CLIP) IMPLANT
COVER MAYO STAND STRL (DRAPES) IMPLANT
COVER SURGICAL LIGHT HANDLE (MISCELLANEOUS) ×3 IMPLANT
COVER WAND RF STERILE (DRAPES) IMPLANT
DECANTER SPIKE VIAL GLASS SM (MISCELLANEOUS) ×3 IMPLANT
DERMABOND ADVANCED (GAUZE/BANDAGES/DRESSINGS)
DERMABOND ADVANCED .7 DNX12 (GAUZE/BANDAGES/DRESSINGS) IMPLANT
DRAPE C-ARM 42X120 X-RAY (DRAPES) IMPLANT
ELECT L-HOOK LAP 45CM DISP (ELECTROSURGICAL)
ELECT REM PT RETURN 15FT ADLT (MISCELLANEOUS) ×3 IMPLANT
ELECTRODE L-HOOK LAP 45CM DISP (ELECTROSURGICAL) IMPLANT
GLOVE BIO SURGEON STRL SZ 6 (GLOVE) ×3 IMPLANT
GLOVE INDICATOR 6.5 STRL GRN (GLOVE) ×3 IMPLANT
GOWN STRL REUS W/TWL 2XL LVL3 (GOWN DISPOSABLE) ×3 IMPLANT
GOWN STRL REUS W/TWL XL LVL3 (GOWN DISPOSABLE) ×6 IMPLANT
HEMOSTAT SNOW SURGICEL 2X4 (HEMOSTASIS) IMPLANT
KIT BASIN OR (CUSTOM PROCEDURE TRAY) ×3 IMPLANT
KIT TURNOVER KIT A (KITS) IMPLANT
L-HOOK LAP DISP 36CM (ELECTROSURGICAL)
LHOOK LAP DISP 36CM (ELECTROSURGICAL) IMPLANT
PENCIL SMOKE EVACUATOR (MISCELLANEOUS) IMPLANT
POUCH RETRIEVAL ECOSAC 10 (ENDOMECHANICALS) ×1 IMPLANT
POUCH RETRIEVAL ECOSAC 10MM (ENDOMECHANICALS) ×2
POUCH SPECIMEN RETRIEVAL 10MM (ENDOMECHANICALS) ×3 IMPLANT
PROTECTOR NERVE ULNAR (MISCELLANEOUS) IMPLANT
SCISSORS LAP 5X35 DISP (ENDOMECHANICALS) ×3 IMPLANT
SET CHOLANGIOGRAPH MIX (MISCELLANEOUS) IMPLANT
SET IRRIG TUBING LAPAROSCOPIC (IRRIGATION / IRRIGATOR) ×3 IMPLANT
SET TUBE SMOKE EVAC HIGH FLOW (TUBING) IMPLANT
SLEEVE XCEL OPT CAN 5 100 (ENDOMECHANICALS) ×3 IMPLANT
SUT MNCRL AB 4-0 PS2 18 (SUTURE) ×3 IMPLANT
TAPE CLOTH 4X10 WHT NS (GAUZE/BANDAGES/DRESSINGS) IMPLANT
TOWEL OR 17X26 10 PK STRL BLUE (TOWEL DISPOSABLE) ×3 IMPLANT
TOWEL OR NON WOVEN STRL DISP B (DISPOSABLE) ×3 IMPLANT
TRAY LAPAROSCOPIC (CUSTOM PROCEDURE TRAY) ×3 IMPLANT
TROCAR BLADELESS OPT 5 100 (ENDOMECHANICALS) ×3 IMPLANT
TROCAR XCEL BLUNT TIP 100MML (ENDOMECHANICALS) ×3 IMPLANT
TROCAR XCEL NON-BLD 11X100MML (ENDOMECHANICALS) ×3 IMPLANT

## 2019-10-29 NOTE — Op Note (Signed)
Laparoscopic Cholecystectomy  Indications: This patient presents with chronic calculous cholecystitis and will undergo laparoscopic cholecystectomy.  Pre-operative Diagnosis: chronic calculous cholecystitis  Post-operative Diagnosis: Same  Surgeon: Almond Lint   Assistants: n/a  Anesthesia: General endotracheal anesthesia and local  ASA Class: 3  Procedure Details  The patient was seen again in the Holding Room. The risks, benefits, complications, treatment options, and expected outcomes were discussed with the patient. The possibilities of  bleeding, recurrent infection, damage to nearby structures, the need for additional procedures, failure to diagnose a condition, the possible need to convert to an open procedure, and creating a complication requiring transfusion or operation were discussed with the patient. The likelihood of improving the patient's symptoms with return to their baseline status is good.    The patient and/or family concurred with the proposed plan, giving informed consent. The site of surgery properly noted. The patient was taken to Operating Room, and the procedure verified as Laparoscopic Cholecystectomy with possible Intraoperative Cholangiogram. A Time Out was held and the above information confirmed.  Prior to the induction of general anesthesia, antibiotic prophylaxis was administered. General endotracheal anesthesia was then administered and tolerated well. After the induction, the abdomen was prepped with Chloraprep and draped in the sterile fashion. The patient was positioned in the supine position.  Local anesthetic agent was injected into the skin near the umbilicus and a 1.5 cm vertical incision was made with a #11 blade. I dissected down to the abdominal fascia with blunt dissection.  The fascia was incised vertically and we entered the peritoneal cavity bluntly.  A pursestring suture of 0-Vicryl was placed around the fascial opening.  The Hasson cannula was  inserted and secured with the stay suture.  Pneumoperitoneum was then created with CO2 and tolerated well without any adverse changes in the patient's vital signs. An 11-mm port was placed in the subxiphoid position.  Two 5-mm ports were placed in the right upper quadrant. All skin incisions were infiltrated with a local anesthetic agent before making the incision and placing the trocars.   We positioned the patient in reverse Trendelenburg, tilted slightly to the patient's left.  The gallbladder was identified, the fundus grasped and retracted cephalad. Adhesions were lysed bluntly and with the electrocautery where indicated, taking care not to injure any adjacent organs or viscus. The infundibulum was grasped and retracted laterally, exposing the peritoneum overlying the triangle of Calot. This was then divided and exposed in a blunt fashion. A critical view of the cystic duct and cystic artery was obtained.  The cystic duct was clearly identified and bluntly dissected circumferentially.  It was very short, so a cholangiogram was not performed.    The cystic duct was then ligated with clips and divided. The cystic artery was identified, dissected free, ligated with clips and divided as well.   The gallbladder was dissected from the liver bed in retrograde fashion with the electrocautery. The gallbladder was removed and placed in an Endocatch bag.  The gallbladder and Endocatch bag were then removed through the umbilical port site.  The liver bed was irrigated and inspected. Hemostasis was achieved with the electrocautery. Copious irrigation was utilized and was repeatedly aspirated until clear.    We again inspected the right upper quadrant for hemostasis.  Pneumoperitoneum was released as we removed the trocars.   The pursestring suture was used to close the umbilical fascia.  4-0 Monocryl was used to close the skin.   The skin was cleaned and dry, and Dermabond was  applied. The patient was then extubated  and brought to the recovery room in stable condition. Instrument, sponge, and needle counts were correct at closure and at the conclusion of the case.   Findings: Mild chronic inflammation, short cystic duct.    Estimated Blood Loss: min         Drains: none          Specimens: Gallbladder to pathology       Complications: None; patient tolerated the procedure well.         Disposition: PACU - hemodynamically stable.         Condition: stable

## 2019-10-29 NOTE — Discharge Instructions (Addendum)
[facility] Office Phone Number [phone]   POST OP INSTRUCTIONS  Always review your discharge instruction sheet given to you by the facility where your surgery was performed.  IF YOU HAVE DISABILITY OR FAMILY LEAVE FORMS, YOU MUST BRING THEM TO THE OFFICE FOR PROCESSING.  DO NOT GIVE THEM TO YOUR DOCTOR.  1. A prescription for pain medication may be given to you upon discharge.  Take your pain medication as prescribed, if needed.  If narcotic pain medicine is not needed, then you may take acetaminophen (Tylenol) or ibuprofen (Advil) as needed. 2. Take your usually prescribed medications unless otherwise directed 3. If you need a refill on your pain medication, please contact your pharmacy.  They will contact our office to request authorization.  Prescriptions will not be filled after 5pm or on week-ends. 4. You should eat very light the first 24 hours after surgery, such as soup, crackers, pudding, etc.  Resume your normal diet the day after surgery 5. It is common to experience some constipation if taking pain medication after surgery.  Increasing fluid intake and taking a stool softener will usually help or prevent this problem from occurring.  A mild laxative (Milk of Magnesia or Miralax) should be taken according to package directions if there are no bowel movements after 48 hours. 6. You may shower in 48 hours.  The surgical glue will flake off in 2-3 weeks.   7. ACTIVITIES:  No strenuous activity or heavy lifting for 2 week.   a. You may drive when you no longer are taking prescription pain medication, you can comfortably wear a seatbelt, and you can safely maneuver your car and apply brakes. b. RETURN TO WORK:  __________as tolerated 1-2 weeks if no lifting.  _______________ Christy Bell should see your doctor in the office for a follow-up appointment approximately three-four weeks after your surgery.    WHEN TO CALL YOUR DOCTOR: 1. Fever over 101.0 2. Nausea and/or  vomiting. 3. Extreme swelling or bruising. 4. Continued bleeding from incision. 5. Increased pain, redness, or drainage from the incision.  The clinic staff is available to answer your questions during regular business hours.  Please don't hesitate to call and ask to speak to one of the nurses for clinical concerns.  If you have a medical emergency, go to the nearest emergency room or call 911.  A surgeon from [facility] is always on call at the hospital.  For further questions, please visit [email or web address]       General Anesthesia, Adult, Care After This sheet gives you information about how to care for yourself after your procedure. Your health care provider may also give you more specific instructions. If you have problems or questions, contact your health care provider. What can I expect after the procedure? After the procedure, the following side effects are common:  Pain or discomfort at the IV site.  Nausea.  Vomiting.  Sore throat.  Trouble concentrating.  Feeling cold or chills.  Weak or tired.  Sleepiness and fatigue.  Soreness and body aches. These side effects can affect parts of the body that were not involved in surgery. Follow these instructions at home:  For at least 24 hours after the procedure:  Have a responsible adult stay with you. It is important to have someone help care for you until you are awake and alert.  Rest as needed.  Do not: ? Participate in activities in which you could fall or become injured. ? Drive. ? Use heavy  machinery. ? Drink alcohol. ? Take sleeping pills or medicines that cause drowsiness. ? Make important decisions or sign legal documents. ? Take care of children on your own. Eating and drinking  Follow any instructions from your health care provider about eating or drinking restrictions.  When you feel hungry, start by eating small amounts of foods that are soft and easy to digest (bland),  such as toast. Gradually return to your regular diet.  Drink enough fluid to keep your urine pale yellow.  If you vomit, rehydrate by drinking water, juice, or clear broth. General instructions  If you have sleep apnea, surgery and certain medicines can increase your risk for breathing problems. Follow instructions from your health care provider about wearing your sleep device: ? Anytime you are sleeping, including during daytime naps. ? While taking prescription pain medicines, sleeping medicines, or medicines that make you drowsy.  Return to your normal activities as told by your health care provider. Ask your health care provider what activities are safe for you.  Take over-the-counter and prescription medicines only as told by your health care provider.  If you smoke, do not smoke without supervision.  Keep all follow-up visits as told by your health care provider. This is important. Contact a health care provider if:  You have nausea or vomiting that does not get better with medicine.  You cannot eat or drink without vomiting.  You have pain that does not get better with medicine.  You are unable to pass urine.  You develop a skin rash.  You have a fever.  You have redness around your IV site that gets worse. Get help right away if:  You have difficulty breathing.  You have chest pain.  You have blood in your urine or stool, or you vomit blood. Summary  After the procedure, it is common to have a sore throat or nausea. It is also common to feel tired.  Have a responsible adult stay with you for the first 24 hours after general anesthesia. It is important to have someone help care for you until you are awake and alert.  When you feel hungry, start by eating small amounts of foods that are soft and easy to digest (bland), such as toast. Gradually return to your regular diet.  Drink enough fluid to keep your urine pale yellow.  Return to your normal activities as  told by your health care provider. Ask your health care provider what activities are safe for you. This information is not intended to replace advice given to you by your health care provider. Make sure you discuss any questions you have with your health care provider. Document Revised: 01/25/2017 Document Reviewed: 09/07/2016 Elsevier Patient Education  2020 [employer].

## 2019-10-29 NOTE — Interval H&P Note (Signed)
History and Physical Interval Note:  10/29/2019 7:51 AM  Christy Bell  has presented today for surgery, with the diagnosis of CHRONIC CALCULOUS CHOLECYSTITIS.  The various methods of treatment have been discussed with the patient and family. After consideration of risks, benefits and other options for treatment, the patient has consented to  Procedure(s): LAPAROSCOPIC CHOLECYSTECTOMY WITH INTRAOPERATIVE CHOLANGIOGRAM (N/A) as a surgical intervention.  The patient's history has been reviewed, patient examined, no change in status, stable for surgery.  I have reviewed the patient's chart and labs.  Questions were answered to the patient's satisfaction.     Almond Lint

## 2019-10-29 NOTE — Anesthesia Procedure Notes (Signed)
Procedure Name: Intubation Date/Time: 10/29/2019 8:03 AM Performed by: Garrel Ridgel, CRNA Pre-anesthesia Checklist: Patient identified, Emergency Drugs available, Suction available and Patient being monitored Patient Re-evaluated:Patient Re-evaluated prior to induction Oxygen Delivery Method: Circle system utilized Preoxygenation: Pre-oxygenation with 100% oxygen Induction Type: IV induction Ventilation: Mask ventilation without difficulty and Oral airway inserted - appropriate to patient size Laryngoscope Size: Mac and 3 Grade View: Grade I Tube type: Oral Tube size: 7.0 mm Number of attempts: 1 Airway Equipment and Method: Stylet and Oral airway Placement Confirmation: ETT inserted through vocal cords under direct vision,  positive ETCO2 and breath sounds checked- equal and bilateral Secured at: 21 cm Tube secured with: Tape Dental Injury: Teeth and Oropharynx as per pre-operative assessment

## 2019-10-29 NOTE — Anesthesia Postprocedure Evaluation (Signed)
Anesthesia Post Note  Patient: Christy Bell  Procedure(s) Performed: LAPAROSCOPIC CHOLECYSTECTOMY (N/A )     Patient location during evaluation: PACU Anesthesia Type: General Level of consciousness: awake and alert Pain management: pain level controlled Vital Signs Assessment: post-procedure vital signs reviewed and stable Respiratory status: spontaneous breathing, nonlabored ventilation, respiratory function stable and patient connected to nasal cannula oxygen Cardiovascular status: blood pressure returned to baseline and stable Postop Assessment: no apparent nausea or vomiting Anesthetic complications: no   No complications documented.  Last Vitals:  Vitals:   10/29/19 1118 10/29/19 1125  BP:  107/75  Pulse: 99 (!) 105  Resp:  18  Temp:    SpO2: 97% 98%    Last Pain:  Vitals:   10/29/19 1118  TempSrc:   PainSc: 4                  Trevor Iha

## 2019-10-29 NOTE — Transfer of Care (Signed)
Immediate Anesthesia Transfer of Care Note  Patient: Christy Bell  Procedure(s) Performed: LAPAROSCOPIC CHOLECYSTECTOMY (N/A )  Patient Location: PACU  Anesthesia Type:General  Level of Consciousness: awake, oriented and drowsy  Airway & Oxygen Therapy: Patient Spontanous Breathing and Patient connected to face mask oxygen  Post-op Assessment: Report given to RN, Post -op Vital signs reviewed and stable and Patient moving all extremities  Post vital signs: Reviewed and stable  Last Vitals:  Vitals Value Taken Time  BP 127/61 10/29/19 0911  Temp    Pulse 100 10/29/19 0914  Resp 15 10/29/19 0914  SpO2 100 % 10/29/19 0914  Vitals shown include unvalidated device data.  Last Pain:  Vitals:   10/29/19 0550  TempSrc: Oral         Complications: No complications documented.

## 2019-10-29 NOTE — H&P (Signed)
Christy Bell Appointment: 10/19/2019 10:45 AM Location: [facility] Patient #: [id number] DOB: 1991/01/21 Single / Language: [place] / Race: White Female   History of Present Illness Almond Lint MD; 10/19/2019 11:22 AM) The patient is a 29 year old female who presents for evaluation of gall stones. Pt is a 29 yo F who is referred by Charlies Silvers NP for abnormal liver enzymes and u/s showing gallstones. The patient has had RUQ/flank pain for around 3 months. It has progressively worsened and is present all the time. She also has episodes of severe pain and nausea that occur when she eats fatty foods. These resolve on their own after around 30 min or so. She states that the chronic pain is bad enough that she is very stiff and unable to move easily. When the other episodes occur, she has difficulty doing anything. She has two children, age 45 and 5, and she is a single mom. Of note, at least 3 family members have required gallbladder removal. She also has reflux, but this is different that the pain episodes.     RUQ u/s [facility] in [place] 10/03/19 gallstones and hepatic steatosis.  3.7 mm CBD   Past Surgical History (April Staton, [place]; 10/19/2019 10:57 AM) Cesarean Section - Multiple  Oral Surgery   Diagnostic Studies History (April Staton, [place]; 10/19/2019 10:57 AM) Colonoscopy  never Mammogram  never Pap Smear  1-5 years ago  Allergies (April Staton, CMA; 10/19/2019 10:57 AM) No Known Drug Allergies  [10/19/2019]:  Medication History (April Staton, CMA; 10/19/2019 10:58 AM) Symbicort (160-4.5MCG/ACT Aerosol, Inhalation) Active. Sertraline HCl (50MG  Tablet, Oral) Active. Medications Reconciled  Pregnancy / Birth History (April 01-10-1976, [place]; 10/19/2019 10:57 AM) Age at menarche  15 years. Contraceptive History  Intrauterine device. Gravida  2 Irregular periods  Length (months) of breastfeeding  3-6 Maternal age   73-25 Para  2  Other Problems (April Staton, CMA; 10/19/2019 10:57 AM) Anxiety Disorder  Asthma  Back Pain  Bladder Problems  Chest pain  Cholelithiasis  Depression  Gastroesophageal Reflux Disease  Heart murmur  Hemorrhoids  High blood pressure  Migraine Headache     Review of Systems (April Staton CMA; 10/19/2019 10:57 AM) General Not Present- Appetite Loss, Chills, Fatigue, Fever, Night Sweats, Weight Gain and Weight Loss. Skin Not Present- Change in Wart/Mole, Dryness, Hives, Jaundice, New Lesions, Non-Healing Wounds, Rash and Ulcer. HEENT Present- Seasonal Allergies, Sinus Pain, Sore Throat and Wears glasses/contact lenses. Not Present- Earache, Hearing Loss, Hoarseness, Nose Bleed, Oral Ulcers, Ringing in the Ears, Visual Disturbances and Yellow Eyes. Respiratory Present- Chronic Cough and Wheezing. Not Present- Bloody sputum, Difficulty Breathing and Snoring. Breast Present- Breast Pain. Not Present- Breast Mass, Nipple Discharge and Skin Changes. Cardiovascular Present- Chest Pain, Difficulty Breathing Lying Down, Leg Cramps, Palpitations and Shortness of Breath. Not Present- Rapid Heart Rate and Swelling of Extremities. Gastrointestinal Present- Abdominal Pain, Bloating, Change in Bowel Habits, Constipation, Excessive gas, Gets full quickly at meals, Hemorrhoids, Indigestion, Nausea and Rectal Pain. Not Present- Bloody Stool, Chronic diarrhea, Difficulty Swallowing and Vomiting. Female Genitourinary Present- Frequency. Not Present- Nocturia, Painful Urination, Pelvic Pain and Urgency. Musculoskeletal Present- Back Pain, Joint Pain, Joint Stiffness, Muscle Pain and Muscle Weakness. Not Present- Swelling of Extremities. Neurological Present- Decreased Memory, Fainting, Headaches and Tingling. Not Present- Numbness, Seizures, Tremor, Trouble walking and Weakness. Psychiatric Present- Anxiety, Change in Sleep Pattern, Depression and Frequent crying. Not Present- Bipolar  and Fearful. Endocrine Present- Excessive Hunger, Heat Intolerance and Hot flashes. Not  Present- Cold Intolerance, Hair Changes and New Diabetes. Hematology Present- Easy Bruising. Not Present- Blood Thinners, Excessive bleeding, Gland problems, HIV and Persistent Infections.  Vitals (April Staton CMA; 10/19/2019 10:58 AM) 10/19/2019 10:58 AM Weight: 225.25 lb Height: 63in Body Surface Area: 2.03 m Body Mass Index: 39.9 kg/m  Temp.: 97.58F (Temporal)  Pulse: 85 (Regular)  P.OX: 96% (Room air) BP: 110/74(Sitting, Left Arm, Standard)       Physical Exam Almond Lint MD; 10/19/2019 11:22 AM) General Mental Status-Alert. General Appearance-Consistent with stated age. Hydration-Well hydrated. Voice-Normal.  Head and Neck Head-normocephalic, atraumatic with no lesions or palpable masses. Trachea-midline. Thyroid Gland Characteristics - normal size and consistency.  Eye Eyeball - Bilateral-Extraocular movements intact. Sclera/Conjunctiva - Bilateral-No scleral icterus.  Chest and Lung Exam Chest and lung exam reveals -quiet, even and easy respiratory effort with no use of accessory muscles and on auscultation, normal breath sounds, no adventitious sounds and normal vocal resonance. Inspection Chest Wall - Normal. Back - normal.  Cardiovascular Cardiovascular examination reveals -normal heart sounds, regular rate and rhythm with no murmurs and normal pedal pulses bilaterally.  Abdomen Inspection Inspection of the abdomen reveals - No Hernias. Palpation/Percussion Palpation and Percussion of the abdomen reveal - Soft, No Rebound tenderness, No Rigidity (guarding) and No hepatosplenomegaly. Note: Mild RUQ tenderness. Auscultation Auscultation of the abdomen reveals - Bowel sounds normal.  Neurologic Neurologic evaluation reveals -alert and oriented x 3 with no impairment of recent or remote memory. Mental  Status-Normal.  Musculoskeletal Global Assessment -Note: no gross deformities.  Normal Exam - Left-Upper Extremity Strength Normal and Lower Extremity Strength Normal. Normal Exam - Right-Upper Extremity Strength Normal and Lower Extremity Strength Normal.  Lymphatic Head & Neck  General Head & Neck Lymphatics: Bilateral - Description - Normal. Axillary  General Axillary Region: Bilateral - Description - Normal. Tenderness - Non Tender. Femoral & Inguinal  Generalized Femoral & Inguinal Lymphatics: Bilateral - Description - No Generalized lymphadenopathy.    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 The patient was given educational material. I discussed the incision type and location, the location of the gallbladder, the anatomy of the bile ducts and arteries, and the typical progression of surgery. I discussed the possibility of converting to an open operation. I advised of the risks of bleeding, infection, damage to other structures (such as the bile duct, intestine or liver), bile leak, need for other procedures or surgeries, and post op diarrhea/constipation. We discussed the risk of blood clot. We discussed the recovery period and post operative restrictions. The patient was advised against taking blood thinners the week before surgery. nic cholecystitis.  I would expect her RUQ pain to improve with surgery. Current Plans You are being scheduled for surgery- Our schedulers will call you.  You should hear from our office's scheduling department within 5 working days about the location, date, and time of surgery. We try to make accommodations for patient's preferences in scheduling surgery, but sometimes the OR schedule or the surgeon's schedule prevents [place] from making those accommodations.  If you have not  heard from our office [phone]) in 5 working days, call the office and ask for your surgeon's nurse.  If you have other questions about your diagnosis, plan, or surgery, call the office and ask for your surgeon's nurse.  Pt Education - Laparoscopic Cholecystectomy: gallbladder

## 2019-10-30 ENCOUNTER — Encounter (HOSPITAL_COMMUNITY): Payer: Self-pay | Admitting: General Surgery

## 2019-10-30 LAB — SURGICAL PATHOLOGY

## 2019-11-04 ENCOUNTER — Emergency Department (HOSPITAL_COMMUNITY): Payer: Medicaid Other

## 2019-11-04 ENCOUNTER — Encounter (HOSPITAL_COMMUNITY): Payer: Self-pay

## 2019-11-04 ENCOUNTER — Other Ambulatory Visit: Payer: Self-pay

## 2019-11-04 ENCOUNTER — Emergency Department (HOSPITAL_COMMUNITY)
Admission: EM | Admit: 2019-11-04 | Discharge: 2019-11-04 | Disposition: A | Discharge status: Home / Self Care | Payer: Medicaid Other | Attending: Emergency Medicine | Admitting: Emergency Medicine

## 2019-11-04 DIAGNOSIS — R1013 Epigastric pain: Secondary | ICD-10-CM | POA: Insufficient documentation

## 2019-11-04 DIAGNOSIS — Z951 Presence of aortocoronary bypass graft: Secondary | ICD-10-CM | POA: Insufficient documentation

## 2019-11-04 DIAGNOSIS — Z87891 Personal history of nicotine dependence: Secondary | ICD-10-CM | POA: Insufficient documentation

## 2019-11-04 DIAGNOSIS — J45909 Unspecified asthma, uncomplicated: Secondary | ICD-10-CM | POA: Insufficient documentation

## 2019-11-04 DIAGNOSIS — G8918 Other acute postprocedural pain: Secondary | ICD-10-CM

## 2019-11-04 DIAGNOSIS — R072 Precordial pain: Secondary | ICD-10-CM | POA: Insufficient documentation

## 2019-11-04 LAB — COMPREHENSIVE METABOLIC PANEL
ALT: 65 U/L — ABNORMAL HIGH (ref 0–44)
AST: 39 U/L (ref 15–41)
Albumin: 4.2 g/dL (ref 3.5–5.0)
Alkaline Phosphatase: 74 U/L (ref 38–126)
Anion gap: 12 (ref 5–15)
BUN: 8 mg/dL (ref 6–20)
CO2: 25 mmol/L (ref 22–32)
Calcium: 9 mg/dL (ref 8.9–10.3)
Chloride: 100 mmol/L (ref 98–111)
Creatinine, Ser: 0.72 mg/dL (ref 0.44–1.00)
GFR calc Af Amer: >60 mL/min (ref >60)
GFR calc non Af Amer: >60 mL/min (ref >60)
Glucose, Bld: 83 mg/dL (ref 70–99)
Potassium: 4.3 mmol/L (ref 3.5–5.1)
Sodium: 137 mmol/L (ref 135–145)
Total Bilirubin: 1.3 mg/dL — ABNORMAL HIGH (ref 0.3–1.2)
Total Protein: 7.4 g/dL (ref 6.5–8.1)

## 2019-11-04 LAB — CBC WITH DIFFERENTIAL/PLATELET
Abs Immature Granulocytes: 0.02 10*3/uL (ref 0.00–0.07)
Basophils Absolute: 0 10*3/uL (ref 0.0–0.1)
Basophils Relative: 0 %
Eosinophils Absolute: 0.1 10*3/uL (ref 0.0–0.5)
Eosinophils Relative: 1 %
HCT: 41.1 % (ref 36.0–46.0)
Hemoglobin: 13.6 g/dL (ref 12.0–15.0)
Immature Granulocytes: 0 %
Lymphocytes Relative: 27 %
Lymphs Abs: 2.1 10*3/uL (ref 0.7–4.0)
MCH: 29.1 pg (ref 26.0–34.0)
MCHC: 33.1 g/dL (ref 30.0–36.0)
MCV: 88 fL (ref 80.0–100.0)
Monocytes Absolute: 0.3 10*3/uL (ref 0.1–1.0)
Monocytes Relative: 3 %
Neutro Abs: 5.4 10*3/uL (ref 1.7–7.7)
Neutrophils Relative %: 69 %
Platelets: 288 10*3/uL (ref 150–400)
RBC: 4.67 MIL/uL (ref 3.87–5.11)
RDW: 13.2 % (ref 11.5–15.5)
WBC: 7.9 10*3/uL (ref 4.0–10.5)
nRBC: 0 % (ref 0.0–0.2)

## 2019-11-04 LAB — LIPASE, BLOOD: Lipase: 26 U/L (ref 11–51)

## 2019-11-04 LAB — I-STAT BETA HCG BLOOD, ED (MC, WL, AP ONLY): I-stat hCG, quantitative: <5 m[IU]/mL (ref <5)

## 2019-11-04 IMAGING — CT CT ABD-PELV W/ CM
2 of 5 series · 16 of 46 positions shown, 18 images · IV contrast (omnipaque)
Comparison: None

CLINICAL DATA: Nonlocalized upper abdominal pain, laparoscopic
cholecystectomy 10/29/2019

EXAM:
CT ABDOMEN AND PELVIS WITH CONTRAST
TECHNIQUE: Multidetector CT imaging of the abdomen and pelvis was performed
using the standard protocol following bolus administration of
intravenous contrast.
CONTRAST:  100mL OMNIPAQUE IOHEXOL 300 MG/ML  SOLN

[Series 2: axial st · axial · 0.85mm/px · z∈[+1023,+1413]mm · 13 of 92 slices shown, 15 images]
[im 7/92  soft-tissue]
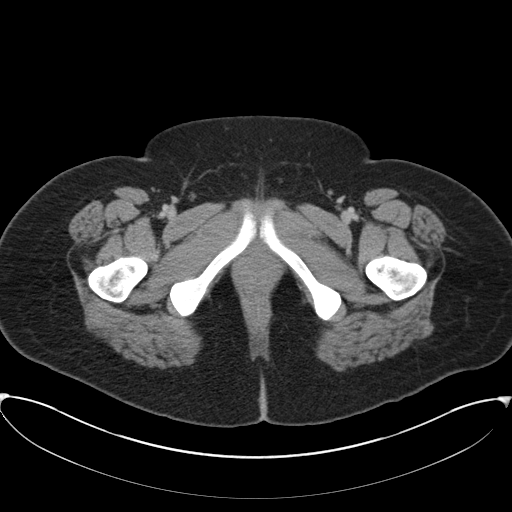
[im 7/92  bone]
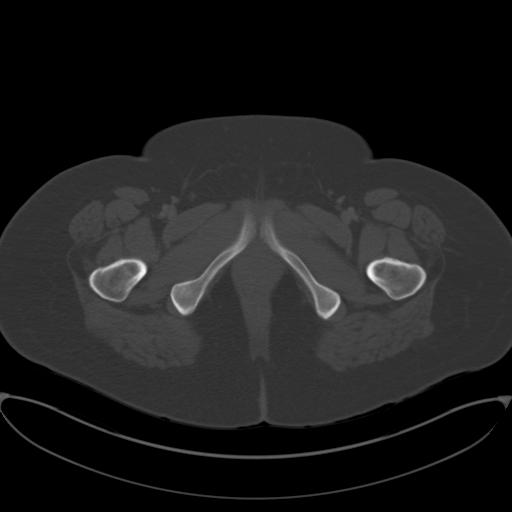
[im 13/92  soft-tissue]
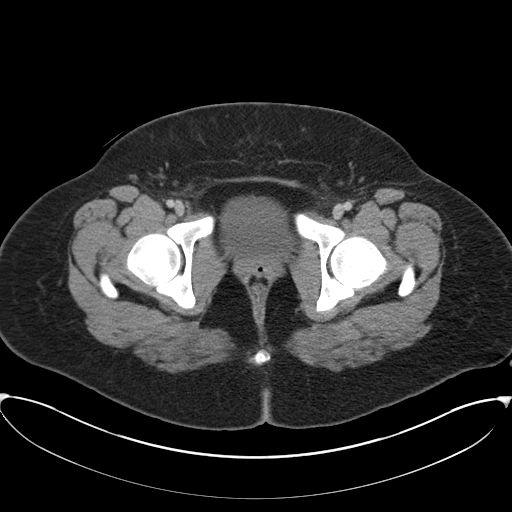
[im 19/92  soft-tissue]
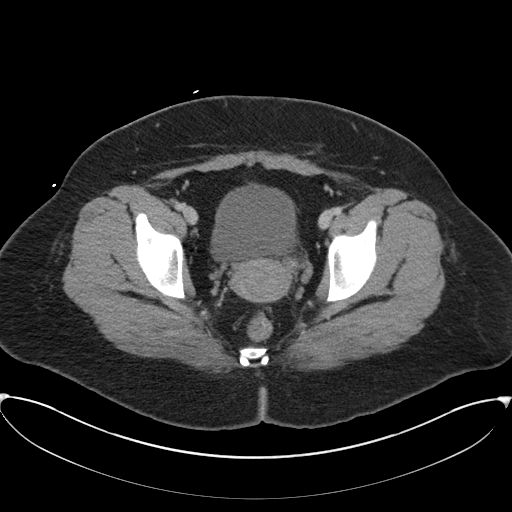
[im 25/92  soft-tissue]
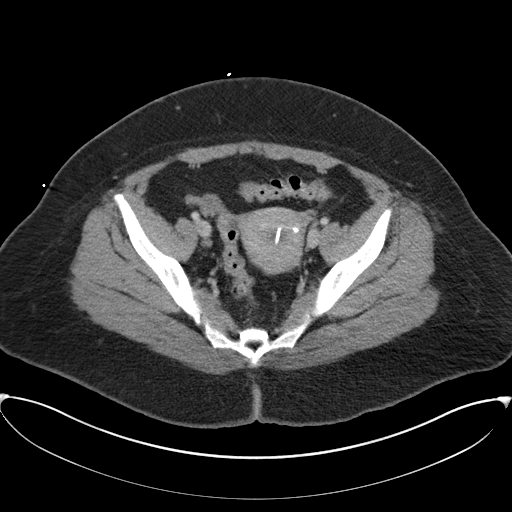
[im 31/92  soft-tissue]
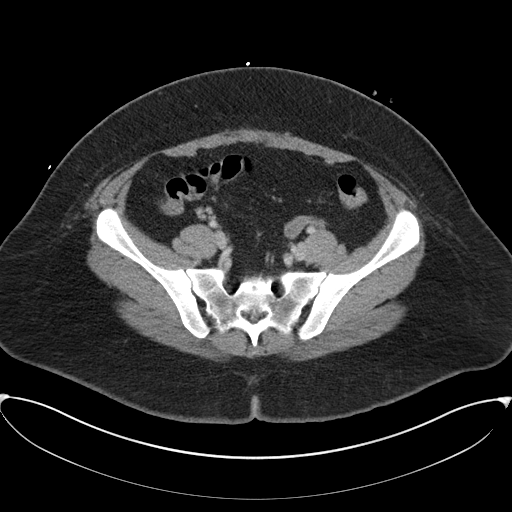
[im 37/92  soft-tissue]
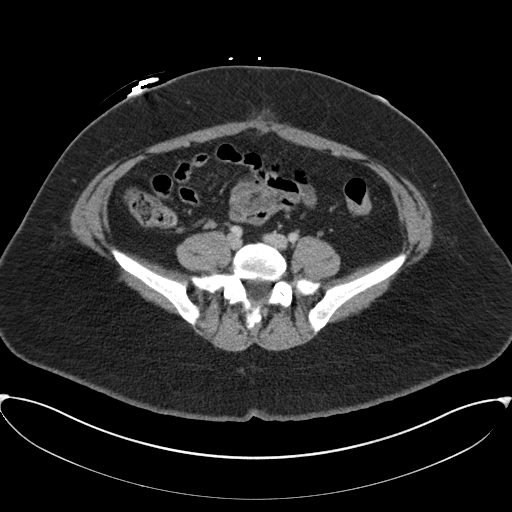
[im 49/92  soft-tissue]
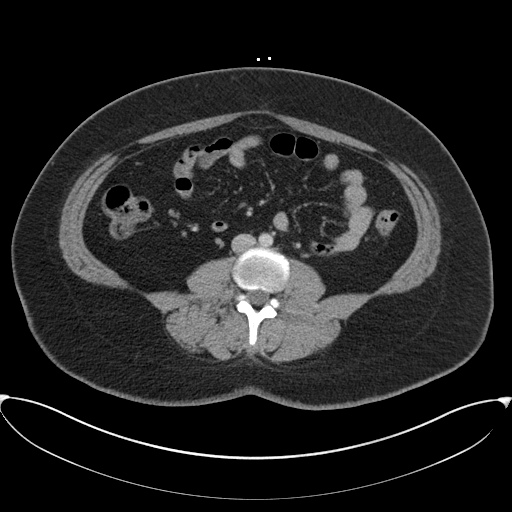
[im 55/92  soft-tissue]
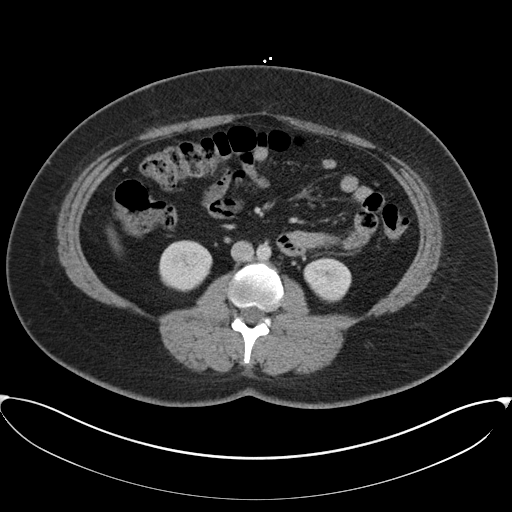
[im 61/92  soft-tissue]
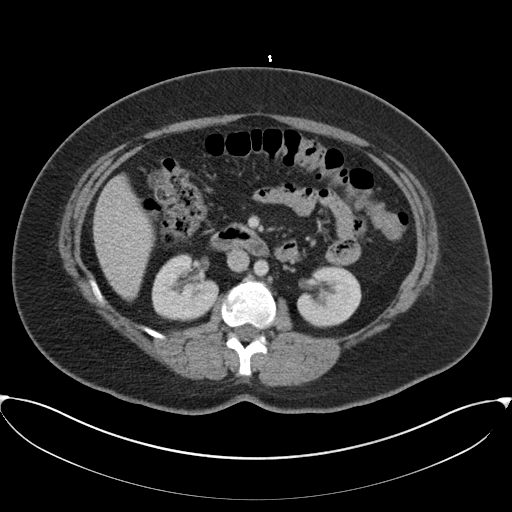
[im 61/92  bone]
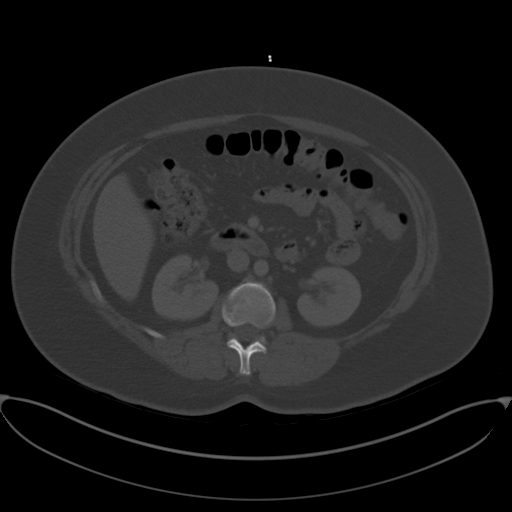
[im 67/92  soft-tissue]
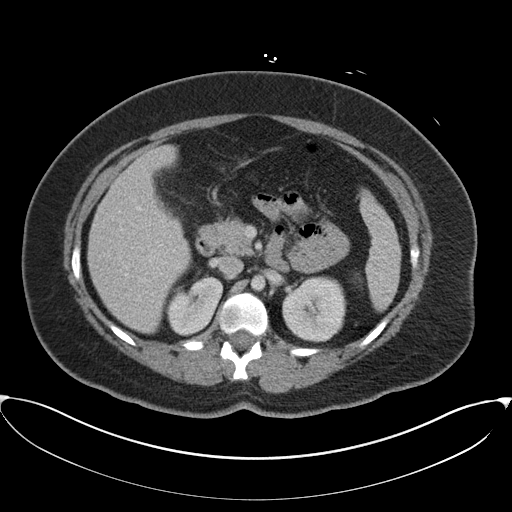
[im 73/92  soft-tissue]
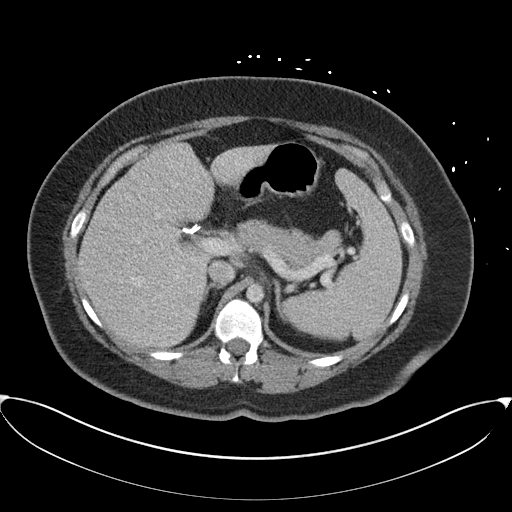
[im 79/92  soft-tissue]
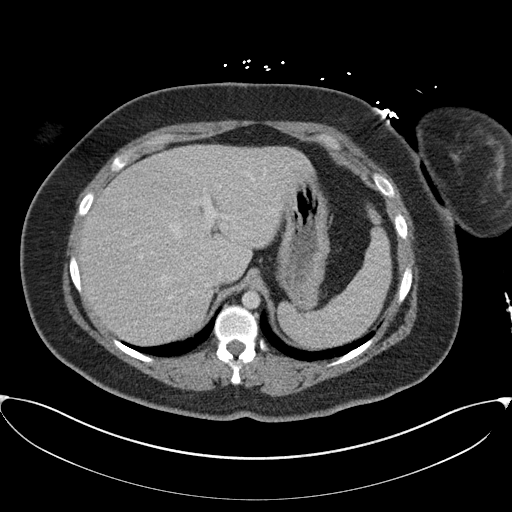
[im 85/92  soft-tissue]
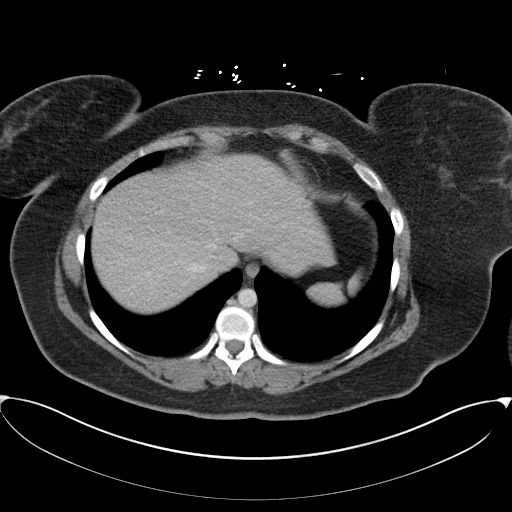

[Series 4: coronal st · coronal · 0.88mm/px · 3 of 151 slices shown]
[im 51/151  soft-tissue]
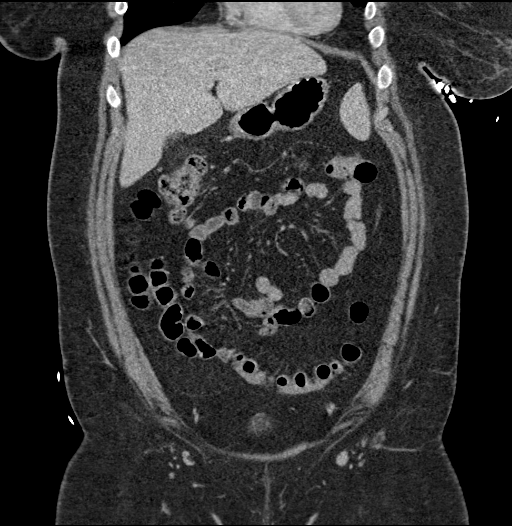
[im 67/151  soft-tissue]
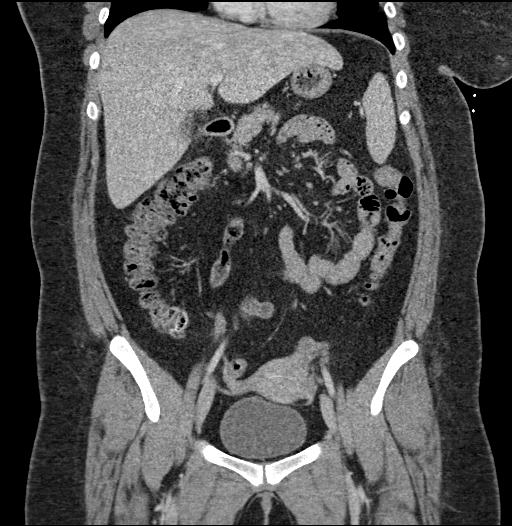
[im 84/151  soft-tissue]
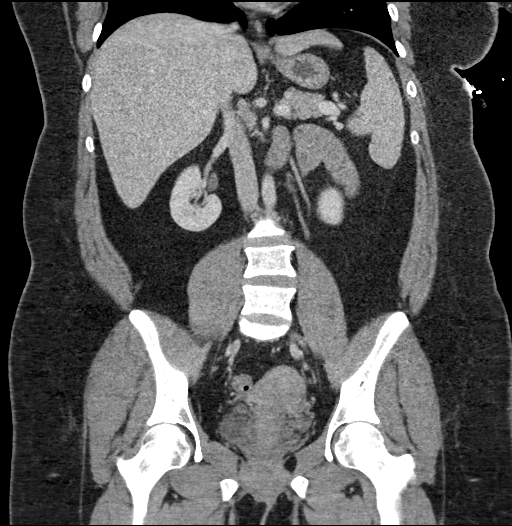

[16 of 46 positions shown; findings below may reference images not displayed]

FINDINGS: Lower chest: Lung bases are clear. Normal heart size. No pericardial
effusion.

Hepatobiliary: No worrisome focal liver lesions. Smooth liver
surface contour. Normal hepatic attenuation. Minimal stranding is
present in the gallbladder fossa with several surgical clips. These
findings are nonspecific given the recency of the operation.
However, there is no large organized collection to suggest bile leak
or biloma.

Pancreas: Unremarkable. No pancreatic ductal dilatation or
surrounding inflammatory changes.

Spleen: Normal in size. No concerning splenic lesions.

Adrenals/Urinary Tract: Normal adrenal glands. Kidneys are normally
located with symmetric enhancement and excretion. No suspicious
renal lesion, urolithiasis or hydronephrosis. Urinary bladder is
unremarkable.

Stomach/Bowel: Distal esophagus, stomach in duodenum are free of
acute abnormality. No small bowel thickening or dilatation. No
colonic dilatation or wall thickening. A normal appendix is
visualized.

Vascular/Lymphatic: No significant vascular findings are present. No
enlarged abdominal or pelvic lymph nodes.

Reproductive: Anteverted uterus. Normal positioning of the
radiopaque IUD. No concerning adnexal lesions.

Other: Focal soft tissue thickening along the anterior abdominal
wall at the level of the umbilicus possibly a site of port placement
for recent laparoscopic cholecystectomy, could reflect a small
amount of resolving postoperative hemorrhage/inflammation. Small
superimposed fat containing umbilical hernia is present at this
location as well. No bowel containing hernias. No abdominopelvic
free air or fluid.

Musculoskeletal: Degenerative changes are present in the spine
including Schmorl's node formation in the anterior inferior corner
L1. Levocurvature of the lumbar spine with an apex at the L4-5
level. No acute osseous abnormality or suspicious osseous lesion.
IMPRESSION: 1. Minimal stranding in the gallbladder fossa with several surgical
clips compatible with history of recent laparoscopic
cholecystectomy. These findings are nonspecific given the recency of
the operation. However, there is no large organized collection to
suggest bile leak or biloma.
2. Focal soft tissue thickening along the anterior abdominal wall at
the level of the umbilicus adjacent a tiny fat containing umbilical
hernia. Possibly a site of port placement for recent laparoscopic
cholecystectomy, could reflect a small amount of resolving
postoperative hemorrhage/inflammation, correlate for point
tenderness.
3. No other acute abnormality in the abdomen or pelvis.

## 2019-11-04 MED ORDER — FENTANYL CITRATE (PF) 100 MCG/2ML IJ SOLN
50.0000 ug | Freq: Once | INTRAMUSCULAR | Status: AC
  Administered 2019-11-04: 50 ug via INTRAVENOUS
  Filled 2019-11-04: qty 2

## 2019-11-04 MED ORDER — ONDANSETRON HCL 4 MG/2ML IJ SOLN
4.0000 mg | Freq: Once | INTRAMUSCULAR | Status: AC
  Administered 2019-11-04: 4 mg via INTRAVENOUS
  Filled 2019-11-04: qty 2

## 2019-11-04 MED ORDER — IOHEXOL 300 MG/ML  SOLN
100.0000 mL | Freq: Once | INTRAMUSCULAR | Status: AC | PRN
  Administered 2019-11-04: 100 mL via INTRAVENOUS

## 2019-11-04 NOTE — ED Provider Notes (Signed)
Care transferred from previous provider, [place], PA-C at shift change.  See note for full HPI  In summation 29 year old postop day 6 from cholecystectomy who presents for evaluation of epigastric pain, right abdominal pain which radiates into her back and shoulder blade.  Was in contact with her general surgeon.  Did have persistent elevation of liver enzymes.  General surgery recommended she proceed to the emergency department for CT abdomen pelvis to rule out bile leak.  Patient has not had any pain increased with eating.  No unilateral leg swelling, redness or warmth.  Patient denies any shortness of breath or trouble breathing.  Previous provider do not think symptoms consistent with PE.  Plan:  Follow-up on labs and imaging  Physical Exam  BP (!) 106/59   Pulse 85   Temp 98.9 F (37.2 C) (Oral)   Resp 17   Ht 5\' 3"  (1.6 m)   Wt 102.3 kg   SpO2 98%   BMI 39.95 kg/m   Physical Exam Vitals and nursing note reviewed.  Constitutional:      General: She is not in acute distress.    Appearance: She is well-developed. She is not ill-appearing, toxic-appearing or diaphoretic.  HENT:     Head: Normocephalic and atraumatic.     Nose: Nose normal.     Mouth/Throat:     Mouth: Mucous membranes are moist.  Eyes:     Pupils: Pupils are equal, round, and reactive to light.  Cardiovascular:     Rate and Rhythm: Normal rate.     Pulses: Normal pulses.     Heart sounds: Normal heart sounds.  Pulmonary:     Effort: Pulmonary effort is normal. No respiratory distress.     Breath sounds: Normal breath sounds.  Abdominal:     General: Bowel sounds are normal. There is no distension.     Tenderness: There is abdominal tenderness.     Comments: Generalized  Musculoskeletal:        General: Normal range of motion.     Cervical back: Normal range of motion.  Skin:    General: Skin is warm and dry.     Capillary Refill: Capillary refill takes less than 2 seconds.     Comments: Ecchymosis  to LUE  Neurological:     General: No focal deficit present.     Mental Status: She is alert and oriented to person, place, and time.    ED Course/Procedures     Procedures Labs Reviewed  COMPREHENSIVE METABOLIC PANEL - Abnormal; Notable for the following components:      Result Value   ALT 65 (*)    Total Bilirubin 1.3 (*)    All other components within normal limits  CBC WITH DIFFERENTIAL/PLATELET  LIPASE, BLOOD  I-STAT BETA HCG BLOOD, ED ([facility], [facility], [facility] ONLY)  CT ABDOMEN PELVIS W CONTRAST  Result Date: 11/04/2019 CLINICAL DATA:  Nonlocalized upper abdominal pain, laparoscopic cholecystectomy 10/29/2019 EXAM: CT ABDOMEN AND PELVIS WITH CONTRAST TECHNIQUE: Multidetector CT imaging of the abdomen and pelvis was performed using the standard protocol following bolus administration of intravenous contrast. CONTRAST:  10/31/2019 OMNIPAQUE IOHEXOL 300 MG/ML  SOLN COMPARISON:  None FINDINGS: Lower chest: Lung bases are clear. Normal heart size. No pericardial effusion. Hepatobiliary: No worrisome focal liver lesions. Smooth liver surface contour. Normal hepatic attenuation. Minimal stranding is present in the gallbladder fossa with several surgical clips. These findings are nonspecific given the recency of the operation. However, there is no large organized collection to suggest  bile leak or biloma. Pancreas: Unremarkable. No pancreatic ductal dilatation or surrounding inflammatory changes. Spleen: Normal in size. No concerning splenic lesions. Adrenals/Urinary Tract: Normal adrenal glands. Kidneys are normally located with symmetric enhancement and excretion. No suspicious renal lesion, urolithiasis or hydronephrosis. Urinary bladder is unremarkable. Stomach/Bowel: Distal esophagus, stomach in duodenum are free of acute abnormality. No small bowel thickening or dilatation. No colonic dilatation or wall thickening. A normal appendix is visualized. Vascular/Lymphatic: No significant vascular findings are  present. No enlarged abdominal or pelvic lymph nodes. Reproductive: Anteverted uterus. Normal positioning of the radiopaque IUD. No concerning adnexal lesions. Other: Focal soft tissue thickening along the anterior abdominal wall at the level of the umbilicus possibly a site of port placement for recent laparoscopic cholecystectomy, could reflect a small amount of resolving postoperative hemorrhage/inflammation. Small superimposed fat containing umbilical hernia is present at this location as well. No bowel containing hernias. No abdominopelvic free air or fluid. Musculoskeletal: Degenerative changes are present in the spine including Schmorl's node formation in the anterior inferior corner L1. Levocurvature of the lumbar spine with an apex at the L4-5 level. No acute osseous abnormality or suspicious osseous lesion. IMPRESSION: 1. Minimal stranding in the gallbladder fossa with several surgical clips compatible with history of recent laparoscopic cholecystectomy. These findings are nonspecific given the recency of the operation. However, there is no large organized collection to suggest bile leak or biloma. 2. Focal soft tissue thickening along the anterior abdominal wall at the level of the umbilicus adjacent a tiny fat containing umbilical hernia. Possibly a site of port placement for recent laparoscopic cholecystectomy, could reflect a small amount of resolving postoperative hemorrhage/inflammation, correlate for point tenderness. 3. No other acute abnormality in the abdomen or pelvis. Electronically Signed   By: Kreg Shropshire M.D.   On: 11/04/2019 19:10   MDM  Patient unfortunately to CT scanner and her IV had blown prior to administering contrast dye.  Patient was taken back to room for IV placement.  Unfortunately difficulty with additional IV placement.  IV team was subsequently called.  1800: Patient reassessed.  Is requesting pain medicine however states more of a new IV as a cause of her pain  currently.  She denies any emesis here in the emergency department.  She is pending CT scan.  2010: Patient reassessed. Denies pain. Tolerating PO intake without difficulty. Discussed CT findings consistent with recent surgery however no evidence of abscess, bile leak. Discussed close follow up with [facility] who performed her surgery. Symptoms do not seem consistent with PE at this time.  The patient has been appropriately medically screened and/or stabilized in the ED. I have low suspicion for any other emergent medical condition which would require further screening, evaluation or treatment in the ED or require inpatient management.  Patient is hemodynamically stable and in no acute distress.  Patient able to ambulate in department prior to ED.  Evaluation does not show acute pathology that would require ongoing or additional emergent interventions while in the emergency department or further inpatient treatment.  I have discussed the diagnosis with the patient and answered all questions.  Pain is been managed while in the emergency department and patient has no further complaints prior to discharge.  Patient is comfortable with plan discussed in room and is stable for discharge at this time.  I have discussed strict return precautions for returning to the emergency department.  Patient was encouraged to follow-up with PCP/specialist refer to at discharge.  Linwood Dibbles, PA-C 11/04/19 2013    Alvira Monday, MD 11/06/19 [phone]

## 2019-11-04 NOTE — ED Notes (Signed)
CT made aware patient has new IV and ready for CT scan at this time.

## 2019-11-04 NOTE — ED Provider Notes (Signed)
[facility]-EMERGENCY DEPT Provider Note   CSN: [id number] Arrival date & time: 11/04/19  1231     History Chief Complaint  Patient presents with  . Post-op Problem    Christy Bell is a 29 y.o. female.  With recent cholecystectomy, on postop day 6, presents the emergency department with gradually worsening pain over the mid chest, epigastrium with radiation to the right back and shoulder.  Patient has been in contact with her surgeon.  She states that she had lab work which was recently reassuring, showing persistent elevation of liver enzymes.  Her symptoms were not better today and they asked her to come to the emergency department for evaluation.  Patient states that she has had a good appetite and the pain is not worse with eating.  It is worse with deep breathing however she denies significant shortness of breath or trouble breathing.  No fevers, vomiting, or diarrhea.  Onset of symptoms acute.           Past Medical History:  Diagnosis Date  . Anxiety   . Asthma   . Bronchitis   . Depression   . GERD (gastroesophageal reflux disease)   . Gestational diabetes   . Headache   . Hx of chlamydia infection   . Preeclampsia    Resolved after childbirth    Patient Active Problem List   Diagnosis Date Noted  . Status post repeat low transverse cesarean section 02/26/2017    Past Surgical History:  Procedure Laterality Date  . CESAREAN SECTION N/A 09/07/2014   Procedure: CESAREAN SECTION;  Surgeon: Essie Hart, MD;  Location: [facility];  Service: Obstetrics;  Laterality: N/A;  . CESAREAN SECTION N/A 02/26/2017   Procedure: REPEAT CESAREAN SECTION;  Surgeon: Essie Hart, MD;  Location: [facility] BIRTHING SUITES;  Service: Obstetrics;  Laterality: N/A;  Tracey RNFA  . CHOLECYSTECTOMY N/A 10/29/2019   Procedure: LAPAROSCOPIC CHOLECYSTECTOMY;  Surgeon: Almond Lint, MD;  Location: [facility] ORS;  Service: General;  Laterality: N/A;  . WISDOM TOOTH EXTRACTION     x1      OB History    Gravida  2   Para  1   Term      Preterm  1   AB      Living  1     SAB      TAB      Ectopic      Multiple  0   Live Births  1           Family History  Problem Relation Age of Onset  . Mental illness Father   . Hypertension Maternal Grandmother   . Heart disease Maternal Grandmother   . Mental illness Maternal Grandmother   . Diabetes Paternal Grandmother     Social History   Tobacco Use  . Smoking status: Former Smoker    Quit date: 02/07/2014    Years since quitting: 5.7  . Smokeless tobacco: Never Used  Vaping Use  . Vaping Use: Never used  Substance Use Topics  . Alcohol use: No    Alcohol/week: 0.0 standard drinks    Comment: Social  . Drug use: No    Home Medications Prior to Admission medications   Medication Sig Start Date End Date Taking? Authorizing Provider  albuterol (VENTOLIN HFA) 108 (90 Base) MCG/ACT inhaler Inhale 1-2 puffs into the lungs every 4 (four) hours as needed for wheezing or shortness of breath. 08/31/19   [provider]  cetirizine (ZYRTEC) 10 MG  tablet Take 10 mg by mouth daily as needed for allergies. 08/31/19   [provider]  hydrOXYzine (ATARAX/VISTARIL) 10 MG tablet Take 10 mg by mouth at bedtime as needed for anxiety. 09/23/19   [provider]  oxyCODONE (OXY IR/ROXICODONE) 5 MG immediate release tablet Take 1 tablet (5 mg total) by mouth every 6 (six) hours as needed for severe pain. 10/29/19   Almond Lint, MD  sertraline (ZOLOFT) 50 MG tablet Take 50 mg by mouth daily.    [provider]  SYMBICORT 160-4.5 MCG/ACT inhaler Inhale 2 puffs into the lungs daily. 09/21/19   [provider]    Allergies    Patient has no known allergies.  Review of Systems   Review of Systems  Constitutional: Negative for appetite change and fever.  HENT: Negative for rhinorrhea and sore throat.   Eyes: Negative for redness.  Respiratory: Negative for cough and  shortness of breath.   Cardiovascular: Positive for chest pain.  Gastrointestinal: Positive for abdominal pain. Negative for diarrhea, nausea and vomiting.  Genitourinary: Negative for dysuria, frequency, hematuria and urgency.  Musculoskeletal: Negative for myalgias.  Skin: Negative for rash.  Neurological: Negative for headaches.    Physical Exam Updated Vital Signs BP (!) 152/92   Pulse 96   Temp 98.9 F (37.2 C) (Oral)   Resp (!) 29   SpO2 100%   Physical Exam Vitals and nursing note reviewed.  Constitutional:      General: She is not in acute distress.    Appearance: She is well-developed.  HENT:     Head: Normocephalic and atraumatic.     Right Ear: External ear normal.     Left Ear: External ear normal.     Nose: Nose normal.  Eyes:     Conjunctiva/sclera: Conjunctivae normal.  Cardiovascular:     Rate and Rhythm: Normal rate and regular rhythm.     Heart sounds: No murmur heard.      Comments: Mild sternal tenderness Pulmonary:     Effort: No respiratory distress.     Breath sounds: No wheezing, rhonchi or rales.  Abdominal:     Palpations: Abdomen is soft.     Tenderness: There is no abdominal tenderness. There is no guarding or rebound.     Comments: Postoperative laparotomy scars appear to be healing well.  No significant abdominal tenderness.  Musculoskeletal:     Cervical back: Normal range of motion and neck supple.     Right lower leg: No edema.     Left lower leg: No edema.     Comments: No clinical signs and symptoms of DVT.  Skin:    General: Skin is warm and dry.     Findings: No rash.  Neurological:     General: No focal deficit present.     Mental Status: She is alert. Mental status is at baseline.     Motor: No weakness.  Psychiatric:        Mood and Affect: Mood normal.     ED Results / Procedures / Treatments   Labs (all labs ordered are listed, but only abnormal results are displayed) Labs Reviewed  CBC WITH DIFFERENTIAL/PLATELET   COMPREHENSIVE METABOLIC PANEL  LIPASE, BLOOD  I-STAT BETA HCG BLOOD, ED ([facility], [facility], [facility] ONLY)    EKG None  Radiology No results found.  Procedures Procedures (including critical care time)  Medications Ordered in ED Medications  fentaNYL (SUBLIMAZE) injection 50 mcg (50 mcg Intravenous Given 11/04/19 1445)  ondansetron (  ZOFRAN) injection 4 mg (4 mg Intravenous Given 11/04/19 1447)    ED Course  I have reviewed the triage vital signs and the nursing notes.  Pertinent labs & imaging results that were available during my care of the patient were reviewed by me and considered in my medical decision making (see chart for details).  Patient seen and examined. Work-up initiated. Medications ordered. Sent by general surgery, per patient, to rule out "bowel leak".  CT ordered to evaluate for postoperative complications.  Patient looks well, nontoxic.  Vital signs reviewed and are as follows: BP (!) 152/92   Pulse 96   Temp 98.9 F (37.2 C) (Oral)   Resp (!) 29   SpO2 100%   2:31 PM Awaiting lab, CT.   3:15 PM Signout to [place] PA-C at shift change.   Plan: Follow-up on CT and labs.  Contact general surgery for recommendations if any positive findings.    MDM Rules/Calculators/A&P                          Pending completion of evaluation for postsurgical upper abdominal pain.  Low concern for PE, ACS.  Patient is not hypoxic, tachycardic, and in no distress   Final Clinical Impression(s) / ED Diagnoses Final diagnoses:  None    Rx / DC Orders ED Discharge Orders    None       Renne Crigler, PA-C 11/04/19 1516    Virgina Norfolk, DO 11/07/19 1125

## 2019-11-04 NOTE — ED Triage Notes (Signed)
Pt presents with c/o back pain and pain under her right rib cage x 2 days. Pt reports that she had gallbladder surgery on 9/23 and over the last couple of days has had increasing pain in those area. Pt consulted with her surgeon and was told to come here for a CT to rule out a bowel leak.

## 2019-11-04 NOTE — Discharge Instructions (Signed)
Return for new or worsening symptoms  Follow up with Dr. Donell Beers within the next few days

## 2019-11-06 ENCOUNTER — Other Ambulatory Visit (HOSPITAL_COMMUNITY): Payer: Self-pay | Admitting: General Surgery

## 2019-11-06 ENCOUNTER — Other Ambulatory Visit: Payer: Self-pay | Admitting: General Surgery

## 2019-11-06 DIAGNOSIS — Z9049 Acquired absence of other specified parts of digestive tract: Secondary | ICD-10-CM

## 2019-11-09 ENCOUNTER — Other Ambulatory Visit (HOSPITAL_COMMUNITY): Payer: Self-pay | Admitting: General Surgery

## 2019-11-09 ENCOUNTER — Ambulatory Visit (HOSPITAL_COMMUNITY)
Admission: RE | Admit: 2019-11-09 | Discharge: 2019-11-09 | Disposition: A | Discharge status: Home / Self Care | Payer: Medicaid Other | Source: Ambulatory Visit | Attending: General Surgery | Admitting: General Surgery

## 2019-11-09 ENCOUNTER — Other Ambulatory Visit: Payer: Self-pay

## 2019-11-09 DIAGNOSIS — Z9049 Acquired absence of other specified parts of digestive tract: Secondary | ICD-10-CM

## 2019-11-09 IMAGING — MR MR ABDOMEN WO/W CM MRCP
19 of 22 series · 43 of 48 positions shown · IV contrast (gadavist)
Comparison: CT on 11/04/2019

CLINICAL DATA: Abdominal pain. Approximately 10 days status post
laparoscopic cholecystectomy.

EXAM:
MRI ABDOMEN WITHOUT AND WITH CONTRAST (INCLUDING MRCP)
TECHNIQUE: Multiplanar multisequence MR imaging of the abdomen was performed
both before and after the administration of intravenous contrast.
Heavily T2-weighted images of the biliary and pancreatic ducts were
obtained, and three-dimensional MRCP images were rendered by post
processing.
CONTRAST:  10mL GADAVIST GADOBUTROL 1 MMOL/ML IV SOLN

[Series 4: ax haste · axial · 6.0mm · 1.19mm/px · 1 of 36 slices shown]
[im 1/36]
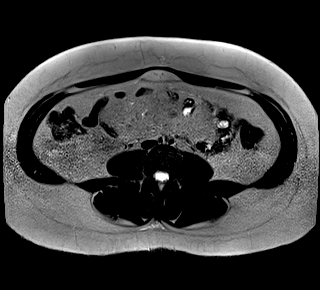

[Series 5: bSSFP · coronal · 6.0mm · 0.78mm/px · 1 of 32 slices shown]
[im 1/32]
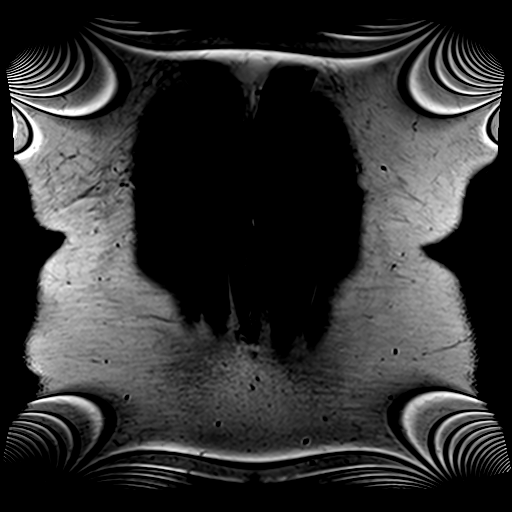

[Series 8: T2 fat-sat · axial · 6.0mm · 1.19mm/px · 1 of 36 slices shown]
[im 1/36]
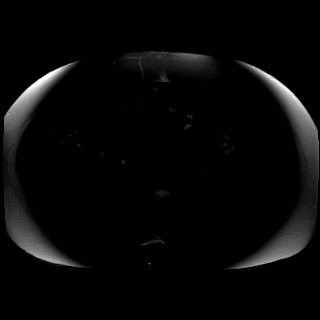

[Series 9: DWI · axial · 6.0mm · 1.49mm/px · z∈[-194,+58]mm · 3 of 108 slices shown (1 of 2)]
[im 1/108]
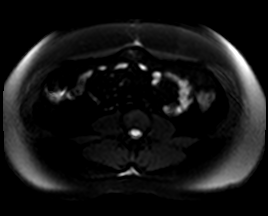
[im 54/108]
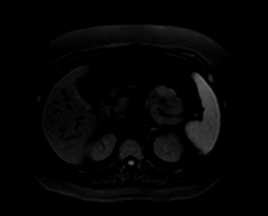
[im 108/108]
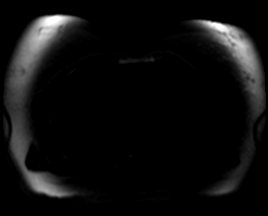

[Series 10: DWI · axial · 6.0mm · 1.49mm/px · 1 of 36 slices shown (2 of 2)]
[im 1/36]
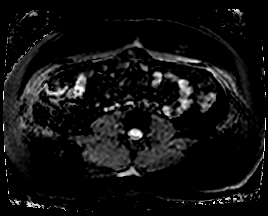

[Series 11: ax in and · axial · 3.0mm · 1.25mm/px · z∈[-198,+63]mm · 2 of 88 slices shown (1 of 2)]
[im 1/88]
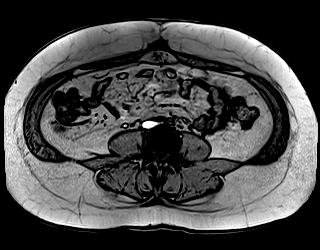
[im 88/88]
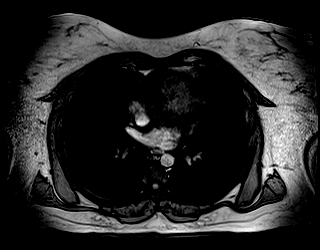

[Series 11: ax in and · axial · 3.0mm · 1.25mm/px · z∈[-198,+63]mm · 3 of 88 slices shown (2 of 2)]
[im 1/88]
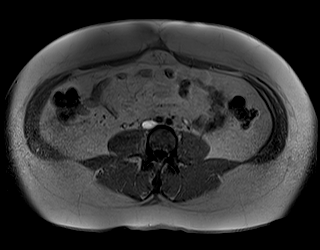
[im 44/88]
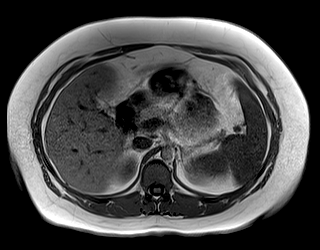
[im 88/88]
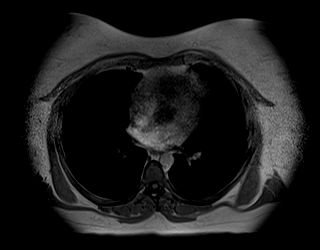

[Series 16: MRCP · coronal · 4.0mm · 1.12mm/px · 1 of 15 slices shown]
[im 1/15]
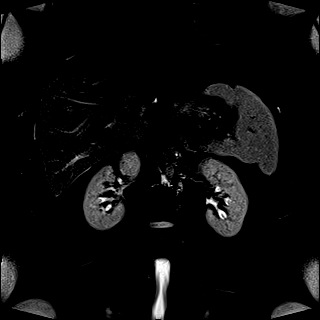

[Series 17: radials · coronal · 50.0mm · 0.78mm/px · 1 of 5 slices shown]
[im 1/5]
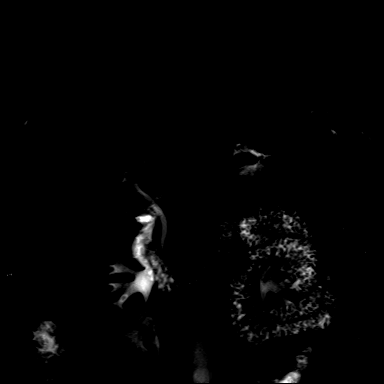

[Series 18: T1 dynamic · axial · non-contrast · 3.0mm · 1.25mm/px · z∈[-198,+63]mm · 3 of 88 slices shown (1 of 5)]
[im 1/88]
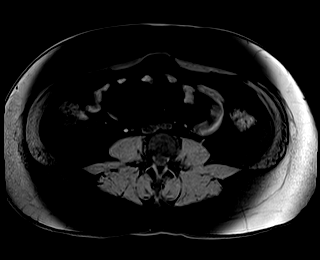
[im 44/88]
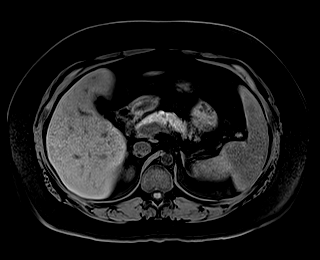
[im 88/88]
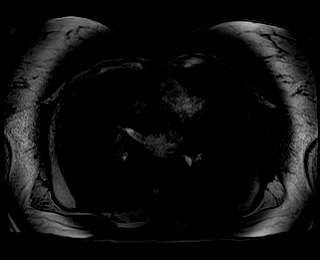

[Series 20: T1 dynamic post-contrast · axial · 3.0mm · 1.25mm/px · z∈[-198,+63]mm · 3 of 88 slices shown (1 of 5)]
[im 1/88]
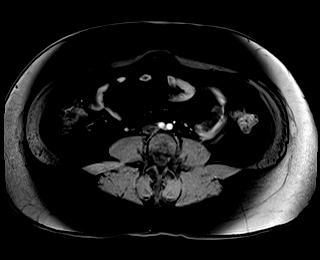
[im 44/88]
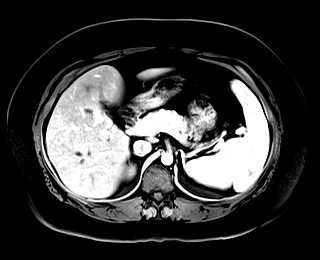
[im 88/88]
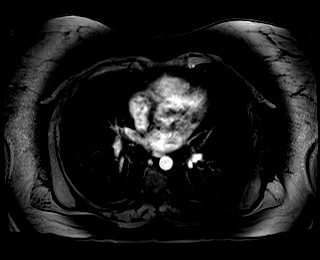

[Series 21: T1 dynamic · axial · 3.0mm · 1.25mm/px · z∈[-198,+63]mm · 3 of 88 slices shown (2 of 5)]
[im 1/88]
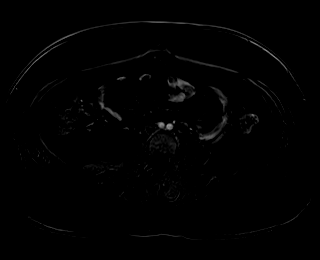
[im 44/88]
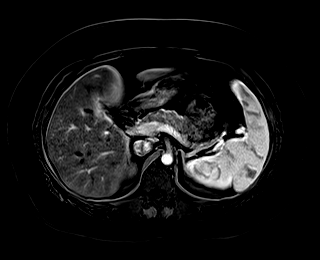
[im 88/88]
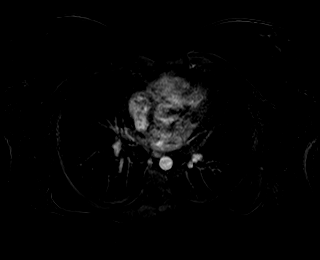

[Series 22: T1 dynamic post-contrast · axial · 3.0mm · 1.25mm/px · z∈[-198,+63]mm · 3 of 88 slices shown (2 of 5)]
[im 1/88]
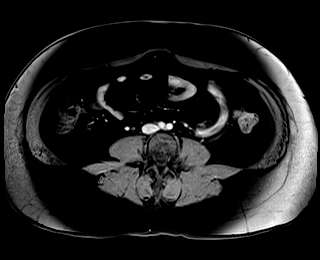
[im 44/88]
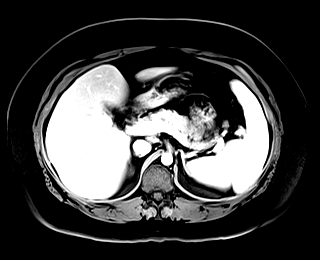
[im 88/88]
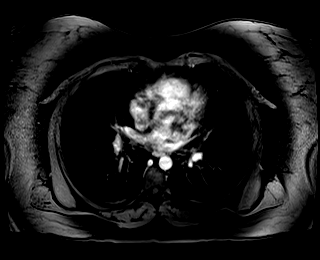

[Series 23: T1 dynamic · axial · 3.0mm · 1.25mm/px · z∈[-198,+63]mm · 3 of 88 slices shown (3 of 5)]
[im 1/88]
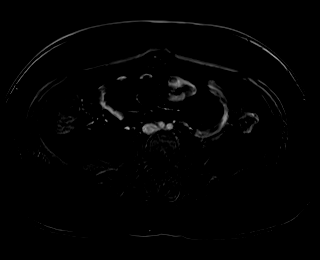
[im 44/88]
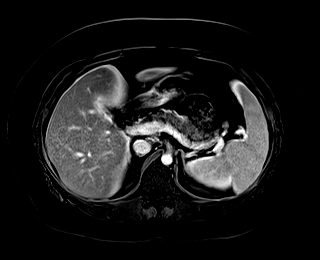
[im 88/88]
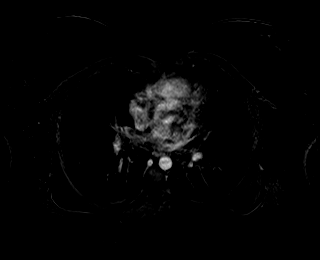

[Series 24: T1 dynamic post-contrast · axial · 3.0mm · 1.25mm/px · z∈[-198,+63]mm · 3 of 88 slices shown (3 of 5)]
[im 1/88]
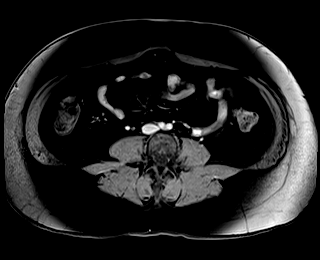
[im 44/88]
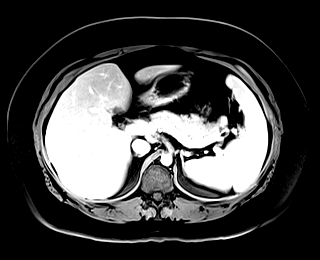
[im 88/88]
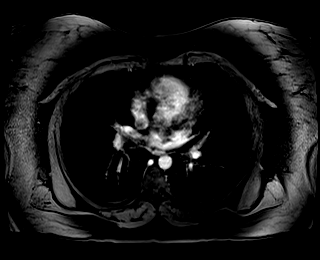

[Series 25: T1 dynamic · axial · 3.0mm · 1.25mm/px · z∈[-198,+63]mm · 3 of 88 slices shown (4 of 5)]
[im 1/88]
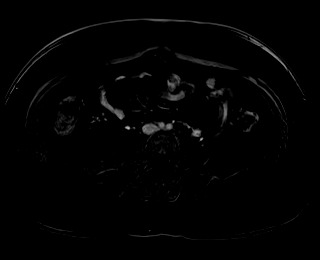
[im 44/88]
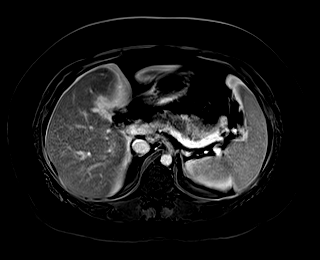
[im 88/88]
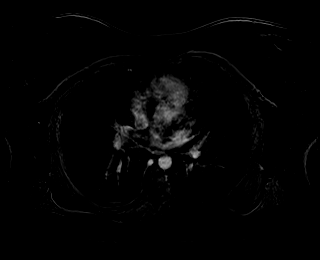

[Series 26: T1 dynamic post-contrast · axial · 3.0mm · 1.25mm/px · z∈[-198,+63]mm · 3 of 88 slices shown (4 of 5)]
[im 1/88]
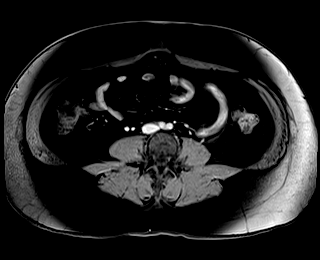
[im 44/88]
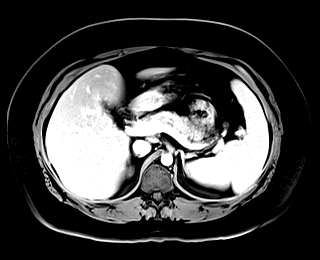
[im 88/88]
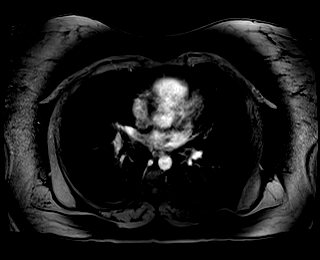

[Series 27: T1 dynamic · axial · 3.0mm · 1.25mm/px · z∈[-198,+63]mm · 3 of 88 slices shown (5 of 5)]
[im 1/88]
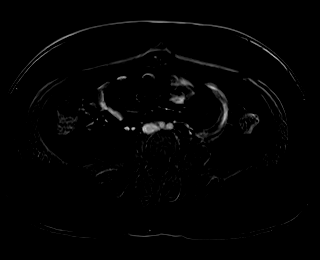
[im 44/88]
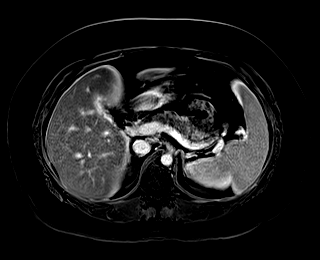
[im 88/88]
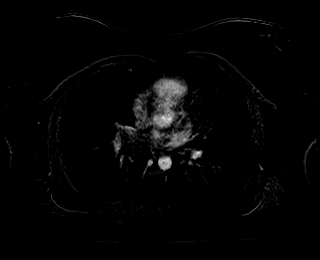

[Series 28: T1 dynamic post-contrast · coronal · 3.0mm · 1.31mm/px · 2 of 72 slices shown (5 of 5)]
[im 1/72]
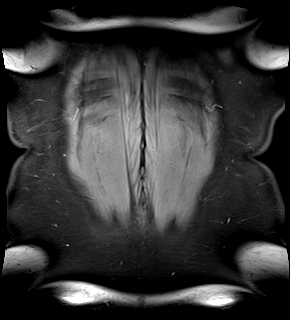
[im 72/72]
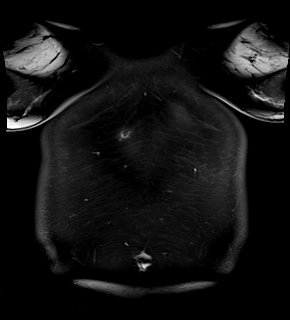

[43 of 48 positions shown; findings below may reference images not displayed]

FINDINGS: Lower chest: No acute findings.

Hepatobiliary: No hepatic masses identified. Prior cholecystectomy
noted. No abnormal postop fluid collections or inflammatory changes
seen. No evidence of biliary ductal dilatation, with common bile
duct measuring 4 mm. No evidence of choledocholithiasis or common
bile duct stricture.

Pancreas: No mass or inflammatory changes. No evidence of pancreatic
ductal dilatation or pancreas divisum.

Spleen:  Within normal limits in size and appearance.

Adrenals/Urinary Tract: No masses identified. No evidence of
hydronephrosis.

Stomach/Bowel: Visualized portion unremarkable.

Vascular/Lymphatic: No pathologically enlarged lymph nodes
identified. No abdominal aortic aneurysm.

Other:  None.

Musculoskeletal:  No suspicious bone lesions identified.
IMPRESSION: Status post cholecystectomy. No acute findings. No evidence of
biliary ductal dilatation, choledocholithiasis, or postop fluid
collection.

## 2019-11-09 MED ORDER — GADOBUTROL 1 MMOL/ML IV SOLN
10.0000 mL | Freq: Once | INTRAVENOUS | Status: AC | PRN
  Administered 2019-11-09: 10 mL via INTRAVENOUS

## 2020-03-31 ENCOUNTER — Ambulatory Visit (INDEPENDENT_AMBULATORY_CARE_PROVIDER_SITE_OTHER): Payer: Medicaid Other

## 2020-03-31 ENCOUNTER — Other Ambulatory Visit: Payer: Self-pay

## 2020-03-31 ENCOUNTER — Ambulatory Visit
Admission: EM | Admit: 2020-03-31 | Discharge: 2020-03-31 | Disposition: A | Discharge status: Home / Self Care | Payer: Medicaid Other | Attending: Emergency Medicine | Admitting: Emergency Medicine

## 2020-03-31 DIAGNOSIS — R0789 Other chest pain: Secondary | ICD-10-CM | POA: Diagnosis not present

## 2020-03-31 DIAGNOSIS — R519 Headache, unspecified: Secondary | ICD-10-CM | POA: Diagnosis not present

## 2020-03-31 HISTORY — DX: Fatty (change of) liver, not elsewhere classified: K76.0

## 2020-03-31 IMAGING — DX DG CHEST 2V
2 series · 2 of 2 positions shown · non-contrast
Comparison: 04/21/2016

CLINICAL DATA: Right-sided chest pain for 2 months

EXAM:
CHEST - 2 VIEW

[chest pa]
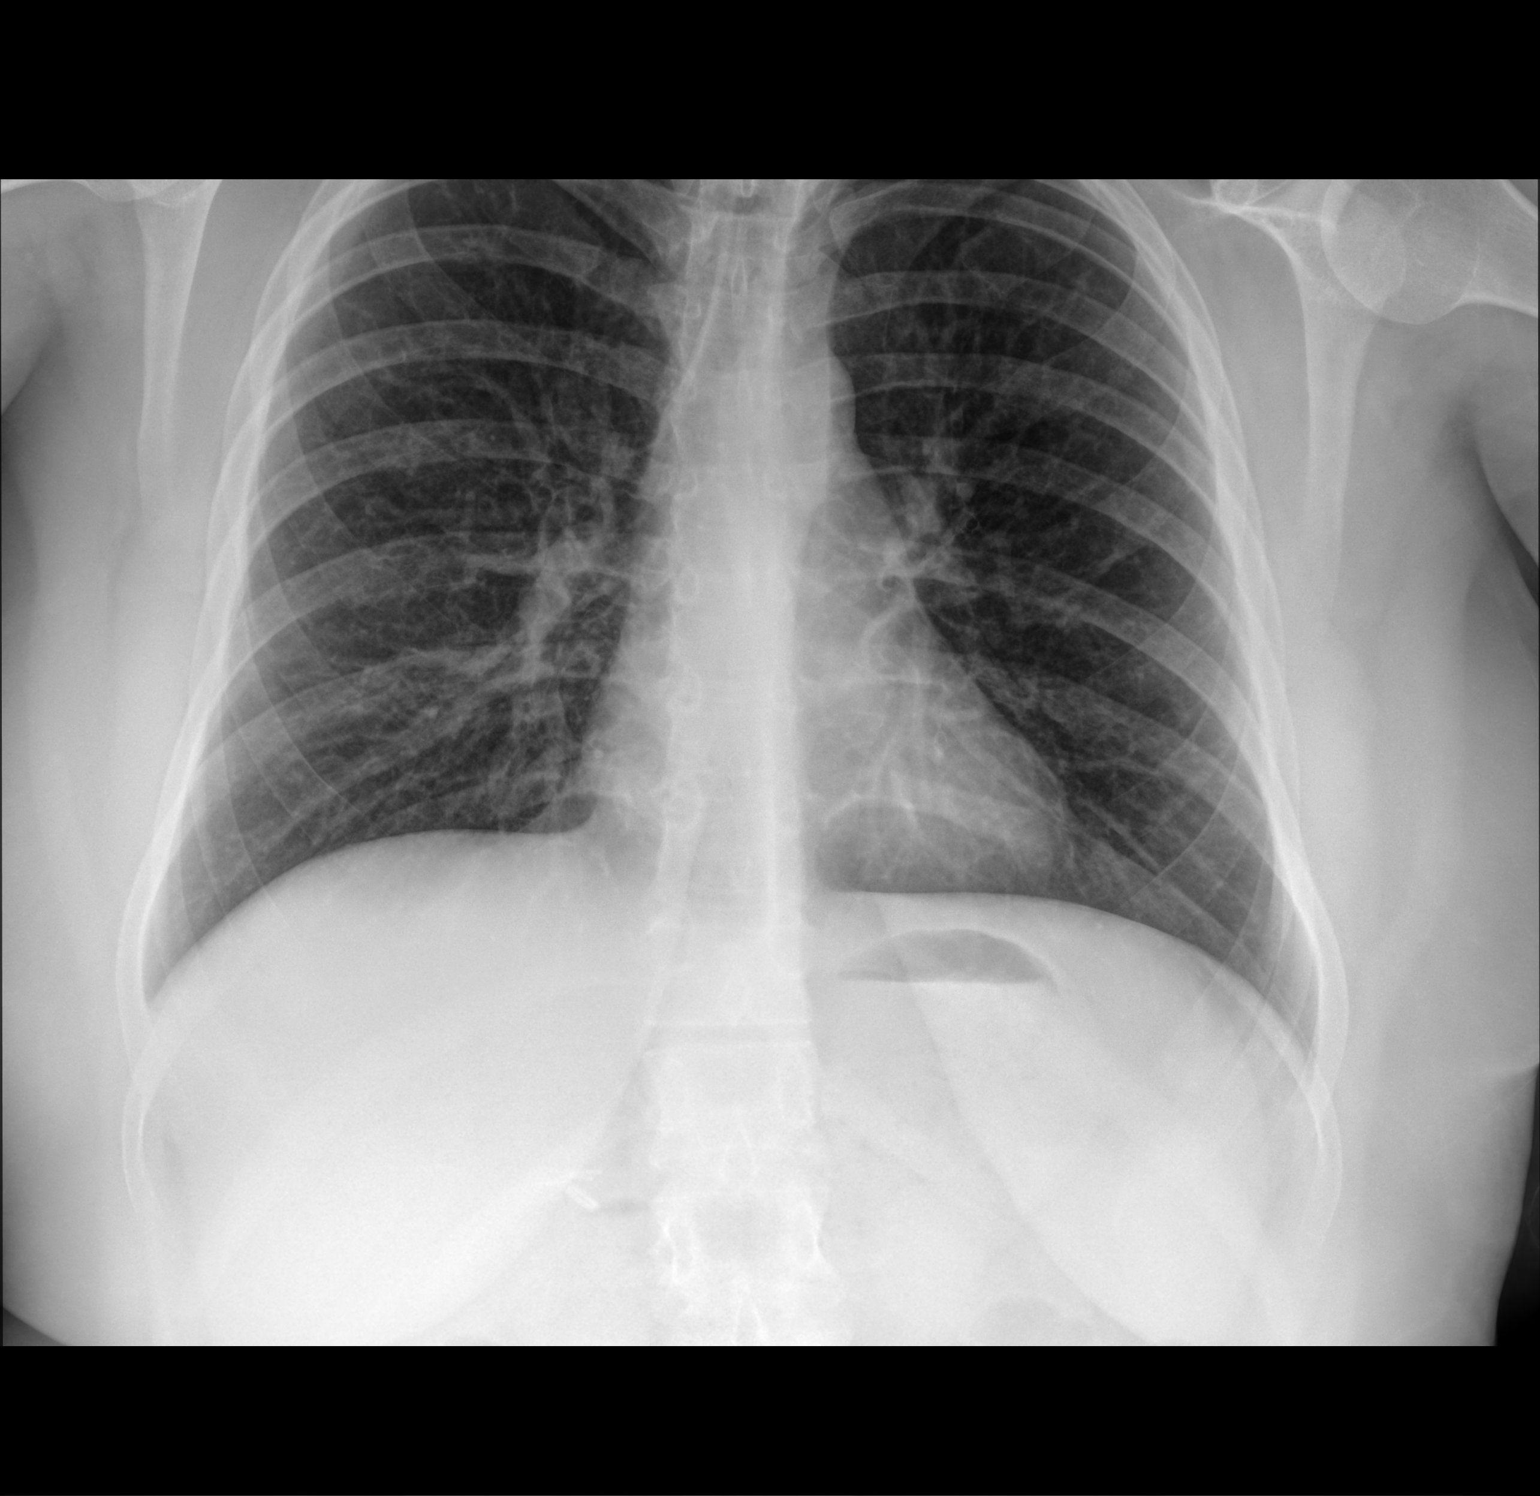

[chest lat]
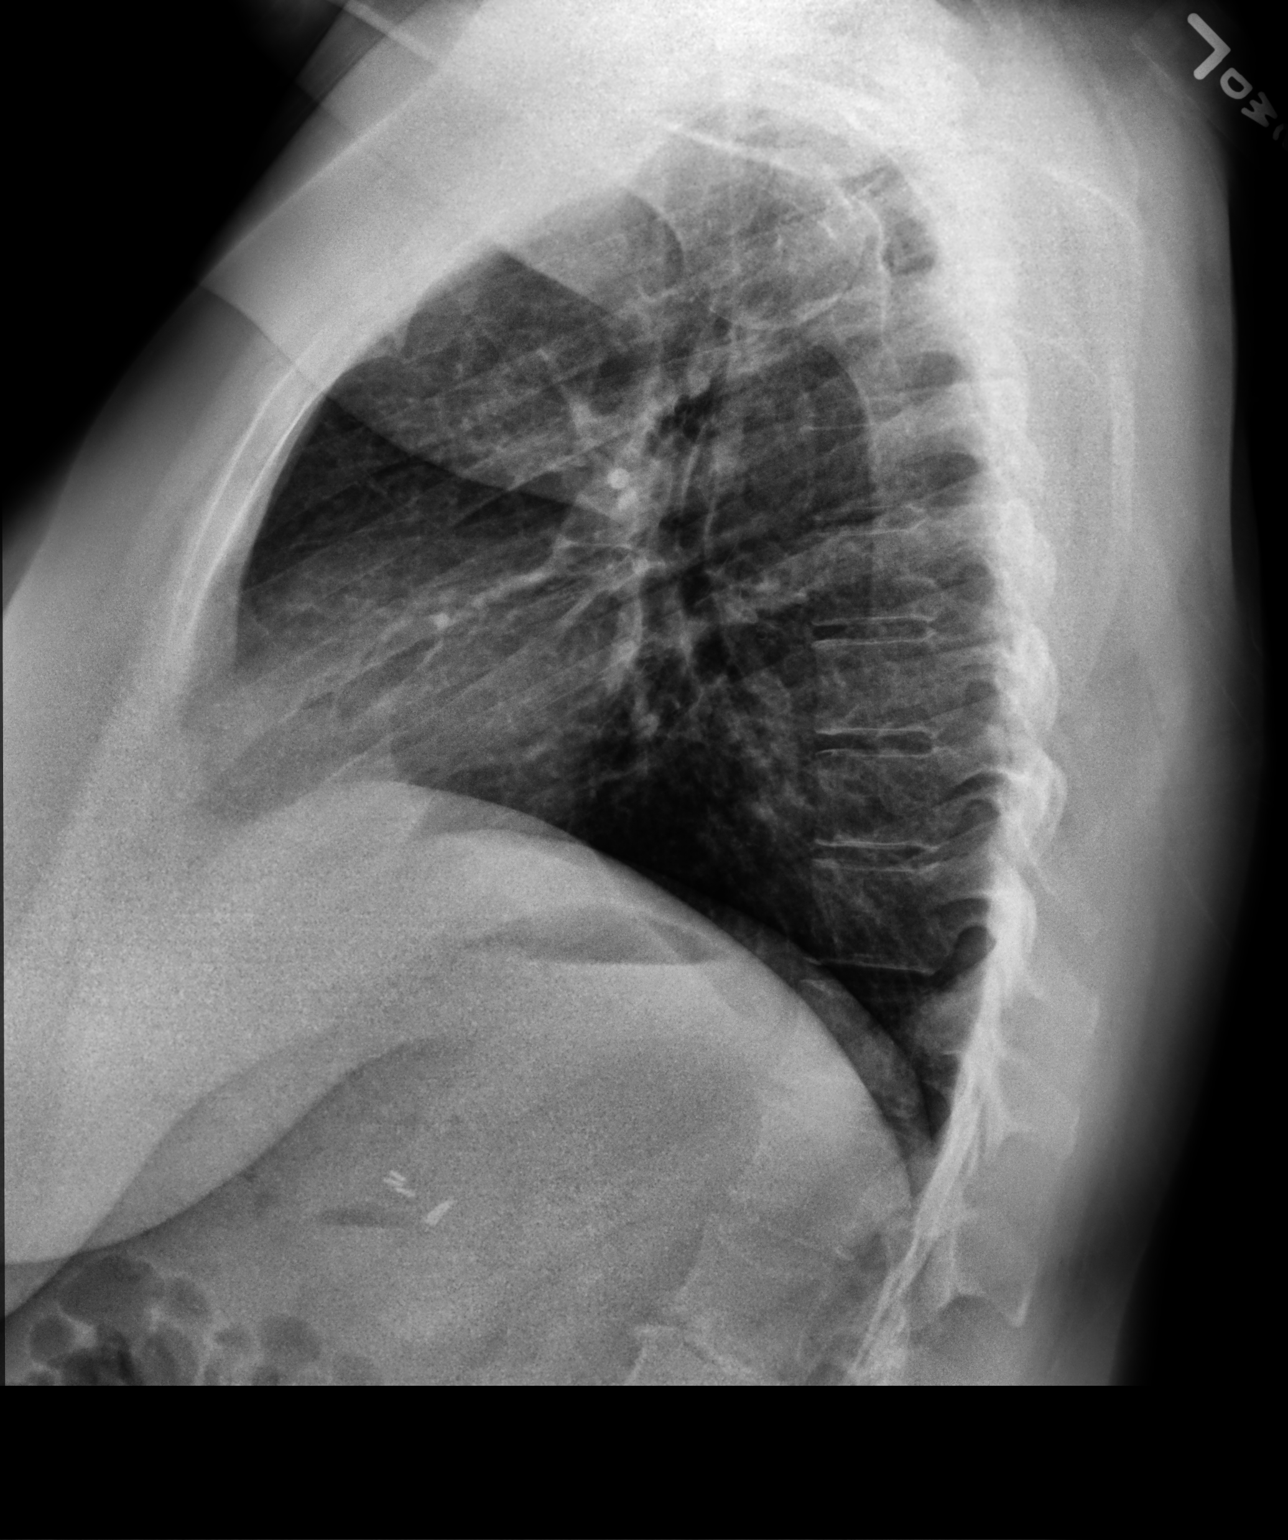

[2 of 2 positions shown; findings below may reference images not displayed]

FINDINGS: Cholecystectomy. Patient rotated minimally left on the frontal.
Midline trachea. Normal heart size and mediastinal contours. No
pleural effusion or pneumothorax. Clear lungs.
IMPRESSION: No acute cardiopulmonary disease.

## 2020-03-31 MED ORDER — NAPROXEN 500 MG PO TABS: 500.0000 mg | ORAL_TABLET | Freq: Two times a day (BID) | ORAL | 0 refills | Status: DC

## 2020-03-31 MED ORDER — DEXAMETHASONE SODIUM PHOSPHATE 10 MG/ML IJ SOLN
10.0000 mg | Freq: Once | INTRAMUSCULAR | Status: AC
  Administered 2020-03-31: 10 mg via INTRAMUSCULAR

## 2020-03-31 MED ORDER — KETOROLAC TROMETHAMINE 30 MG/ML IJ SOLN
30.0000 mg | Freq: Once | INTRAMUSCULAR | Status: AC
  Administered 2020-03-31: 30 mg via INTRAMUSCULAR

## 2020-03-31 NOTE — ED Triage Notes (Signed)
Patient presents to Urgent Care with complaints of intermittent right sided chest discomfort x 2 months and headache today. Pt states she reported problems to PCP no new orders were placed. She has a  hx of migraines. Treating pain with ibuprofen.

## 2020-03-31 NOTE — Discharge Instructions (Addendum)
Chest x-ray normal We gave you a shot of Toradol and Decadron today to help with your headache and side pain Continue with Naprosyn twice daily over the next 7 to 10 days to help with side pain and headaches Alternate ice and heat to side  please follow-up if any symptoms not improving or worsening

## 2020-03-31 NOTE — ED Provider Notes (Signed)
[facility]    CSN: [id number] Arrival date & time: 03/31/20  1615      History   Chief Complaint Chief Complaint  Patient presents with  . Headache  . Rib pain     HPI Christy Bell is a 30 y.o. female presenting today for evaluation of headache and side pain.  Patient reports over the past 2 months she has had discomfort in her right lower ribs and underneath right breast.  Reports pain has been persistent.  Denies any injury or trauma.  Denies associated cough, recent URI symptoms or fevers.  She denies difficulty breathing, but does have increased discomfort with deep breaths.  Denies any issues or pain with moving right arm.  Denies any changes within breast tissue.  Also reports today woke up with severe headache located on the right side.  Said some mild associated congestion.  Denies associated vision changes or nausea.  Took ibuprofen without relief of headache.  HPI  Past Medical History:  Diagnosis Date  . Anxiety   . Asthma   . Bronchitis   . Depression   . Fatty liver   . GERD (gastroesophageal reflux disease)   . Gestational diabetes   . Headache   . Hx of chlamydia infection   . Preeclampsia    Resolved after childbirth    Patient Active Problem List   Diagnosis Date Noted  . Status post repeat low transverse cesarean section 02/26/2017    Past Surgical History:  Procedure Laterality Date  . CESAREAN SECTION N/A 09/07/2014   Procedure: CESAREAN SECTION;  Surgeon: Essie Hart, MD;  Location: [facility];  Service: Obstetrics;  Laterality: N/A;  . CESAREAN SECTION N/A 02/26/2017   Procedure: REPEAT CESAREAN SECTION;  Surgeon: Essie Hart, MD;  Location: [facility] BIRTHING SUITES;  Service: Obstetrics;  Laterality: N/A;  Tracey RNFA  . CHOLECYSTECTOMY N/A 10/29/2019   Procedure: LAPAROSCOPIC CHOLECYSTECTOMY;  Surgeon: Almond Lint, MD;  Location: [facility];  Service: General;  Laterality: N/A;  . GALLBLADDER SURGERY    . WISDOM TOOTH EXTRACTION     x1     OB History    Gravida  2   Para  1   Term      Preterm  1   AB      Living  1     SAB      IAB      Ectopic      Multiple  0   Live Births  1            Home Medications    Prior to Admission medications   Medication Sig Start Date End Date Taking? Authorizing Provider  naproxen (NAPROSYN) 500 MG tablet Take 1 tablet (500 mg total) by mouth 2 (two) times daily. 03/31/20  Yes Curby Carswell C, PA-C  acetaminophen (TYLENOL) 500 MG tablet Take 500 mg by mouth every 6 (six) hours as needed for moderate pain.    [provider]  albuterol (VENTOLIN HFA) 108 (90 Base) MCG/ACT inhaler Inhale 1-2 puffs into the lungs every 4 (four) hours as needed for wheezing or shortness of breath. 08/31/19   [provider]  cetirizine (ZYRTEC) 10 MG tablet Take 10 mg by mouth daily as needed for allergies. 08/31/19   [provider]  hydrOXYzine (ATARAX/VISTARIL) 10 MG tablet Take 10 mg by mouth at bedtime as needed for anxiety. 09/23/19   [provider]  ibuprofen (ADVIL) 200 MG tablet Take 200 mg by mouth every 6 (  six) hours as needed for moderate pain.    [provider]  sertraline (ZOLOFT) 50 MG tablet Take 50 mg by mouth daily.    [provider]  SYMBICORT 160-4.5 MCG/ACT inhaler Inhale 2 puffs into the lungs daily as needed (sob/wheezing).  09/21/19   [provider]    Family History Family History  Problem Relation Age of Onset  . Mental illness Father   . Hypertension Maternal Grandmother   . Heart disease Maternal Grandmother   . Mental illness Maternal Grandmother   . Diabetes Paternal Grandmother     Social History Social History   Tobacco Use  . Smoking status: Former Smoker    Quit date: 02/07/2014    Years since quitting: 6.1  . Smokeless tobacco: Never Used  Vaping Use  . Vaping Use: Never used  Substance Use Topics  . Alcohol use: No    Alcohol/week: 0.0 standard drinks    Comment: Social   . Drug use: No     Allergies   Patient has no known allergies.   Review of Systems Review of Systems  Constitutional: Negative for fever.  Respiratory: Negative for shortness of breath.   Cardiovascular: Positive for chest pain.  Gastrointestinal: Negative for abdominal pain, diarrhea, nausea and vomiting.  Genitourinary: Positive for flank pain. Negative for dysuria, genital sores, hematuria, menstrual problem, vaginal bleeding, vaginal discharge and vaginal pain.  Musculoskeletal: Negative for back pain.  Skin: Negative for rash.  Neurological: Positive for headaches. Negative for dizziness and light-headedness.     Physical Exam Triage Vital Signs ED Triage Vitals  Enc Vitals Group     BP      Pulse      Resp      Temp      Temp src      SpO2      Weight      Height      Head Circumference      Peak Flow      Pain Score      Pain Loc      Pain Edu?      Excl. in GC?    No data found.  Updated Vital Signs BP 124/78 (BP Location: Left Arm)   Pulse 83   Temp 98.8 F (37.1 C) (Oral)   Resp 16   SpO2 98%   Visual Acuity Right Eye Distance:   Left Eye Distance:   Bilateral Distance:    Right Eye Near:   Left Eye Near:    Bilateral Near:     Physical Exam Vitals and nursing note reviewed.  Constitutional:      Appearance: She is well-developed and well-nourished.     Comments: No acute distress  HENT:     Head: Normocephalic and atraumatic.     Ears:     Comments: Bilateral ears without tenderness to palpation of external auricle, tragus and mastoid, EAC's without erythema or swelling, TM's with good bony landmarks and cone of light. Non erythematous.     Nose: Nose normal.     Mouth/Throat:     Comments: Oral mucosa pink and moist, no tonsillar enlargement or exudate. Posterior pharynx patent and nonerythematous, no uvula deviation or swelling. Normal phonation. Eyes:     Extraocular Movements: Extraocular movements intact.      Conjunctiva/sclera: Conjunctivae normal.     Pupils: Pupils are equal, round, and reactive to light.  Cardiovascular:     Rate and Rhythm: Normal rate and regular rhythm.  Pulmonary:     Effort: Pulmonary effort is normal. No respiratory distress.     Comments: Breathing comfortably at rest, CTABL, no wheezing, rales or other adventitious sounds auscultated Abdominal:     General: There is no distension.  Musculoskeletal:        General: Normal range of motion.     Cervical back: Neck supple.     Comments: Right chest with tenderness to palpation centrally and extending underneath right breast and into right axillary area  Full active range of motion of right shoulder  Skin:    General: Skin is warm and dry.  Neurological:     Mental Status: She is alert and oriented to person, place, and time.  Psychiatric:        Mood and Affect: Mood and affect normal.      UC Treatments / Results  Labs (all labs ordered are listed, but only abnormal results are displayed) Labs Reviewed - No data to display  EKG   Radiology DG Chest 2 View  Result Date: 03/31/2020 CLINICAL DATA:  Right-sided chest pain for 2 months EXAM: CHEST - 2 VIEW COMPARISON:  04/21/2016 FINDINGS: Cholecystectomy. Patient rotated minimally left on the frontal. Midline trachea. Normal heart size and mediastinal contours. No pleural effusion or pneumothorax. Clear lungs. IMPRESSION: No acute cardiopulmonary disease. Electronically Signed   By: Jeronimo Greaves M.D.   On: 03/31/2020 17:49    Procedures Procedures (including critical care time)  Medications Ordered in UC Medications  ketorolac (TORADOL) 30 MG/ML injection 30 mg (30 mg Intramuscular Given 03/31/20 1754)  dexamethasone (DECADRON) injection 10 mg (10 mg Intramuscular Given 03/31/20 1754)    Initial Impression / Assessment and Plan / UC Course  I have reviewed the triage vital signs and the nursing notes.  Pertinent labs & imaging results that were  available during my care of the patient were reviewed by me and considered in my medical decision making (see chart for details).     1.  Rib pain-chest x-ray unremarkable, lungs clear to auscultation, suspect most likely chest wall inflammation and recommending continued anti-inflammatories and close monitoring.  Symptoms seem to be around breast and not within breast tissue advised patient to continue to monitor her breast and follow-up if having any changing or worsening symptoms.  2.  Headache-no neuro deficits, no red flags, treating with migraine cocktail with Decadron and Toradol, sending home with Naprosyn for further headache relief.  Discussed strict return precautions. Patient verbalized understanding and is agreeable with plan.  Final Clinical Impressions(s) / UC Diagnoses   Final diagnoses:  Acute nonintractable headache, unspecified headache type  Chest wall pain     Discharge Instructions     Chest x-ray normal We gave you a shot of Toradol and Decadron today to help with your headache and side pain Continue with Naprosyn twice daily over the next 7 to 10 days to help with side pain and headaches Alternate ice and heat to side  please follow-up if any symptoms not improving or worsening    ED Prescriptions    Medication Sig Dispense Auth. Provider   naproxen (NAPROSYN) 500 MG tablet Take 1 tablet (500 mg total) by mouth 2 (two) times daily. 30 tablet Eda Magnussen, [place] C, PA-C     PDMP not reviewed this encounter.   Lew Dawes, [place] 03/31/20 1820

## 2020-04-06 ENCOUNTER — Ambulatory Visit: Payer: Medicaid Other | Attending: Nurse Practitioner | Admitting: Physical Therapy

## 2020-10-26 ENCOUNTER — Ambulatory Visit (INDEPENDENT_AMBULATORY_CARE_PROVIDER_SITE_OTHER): Payer: Medicaid Other

## 2020-10-26 ENCOUNTER — Encounter: Payer: Self-pay | Admitting: Emergency Medicine

## 2020-10-26 ENCOUNTER — Other Ambulatory Visit: Payer: Self-pay

## 2020-10-26 ENCOUNTER — Ambulatory Visit
Admission: EM | Admit: 2020-10-26 | Discharge: 2020-10-26 | Disposition: A | Discharge status: Home / Self Care | Payer: Medicaid Other | Attending: Urgent Care | Admitting: Urgent Care

## 2020-10-26 DIAGNOSIS — S93602A Unspecified sprain of left foot, initial encounter: Secondary | ICD-10-CM | POA: Diagnosis not present

## 2020-10-26 DIAGNOSIS — M79672 Pain in left foot: Secondary | ICD-10-CM | POA: Diagnosis not present

## 2020-10-26 IMAGING — DX DG FOOT COMPLETE 3+V*L*
3 series · 3 of 3 positions shown · non-contrast
Comparison: None.

CLINICAL DATA: Left foot pain.

EXAM:
LEFT FOOT - COMPLETE 3+ VIEW

[foot supine dp]
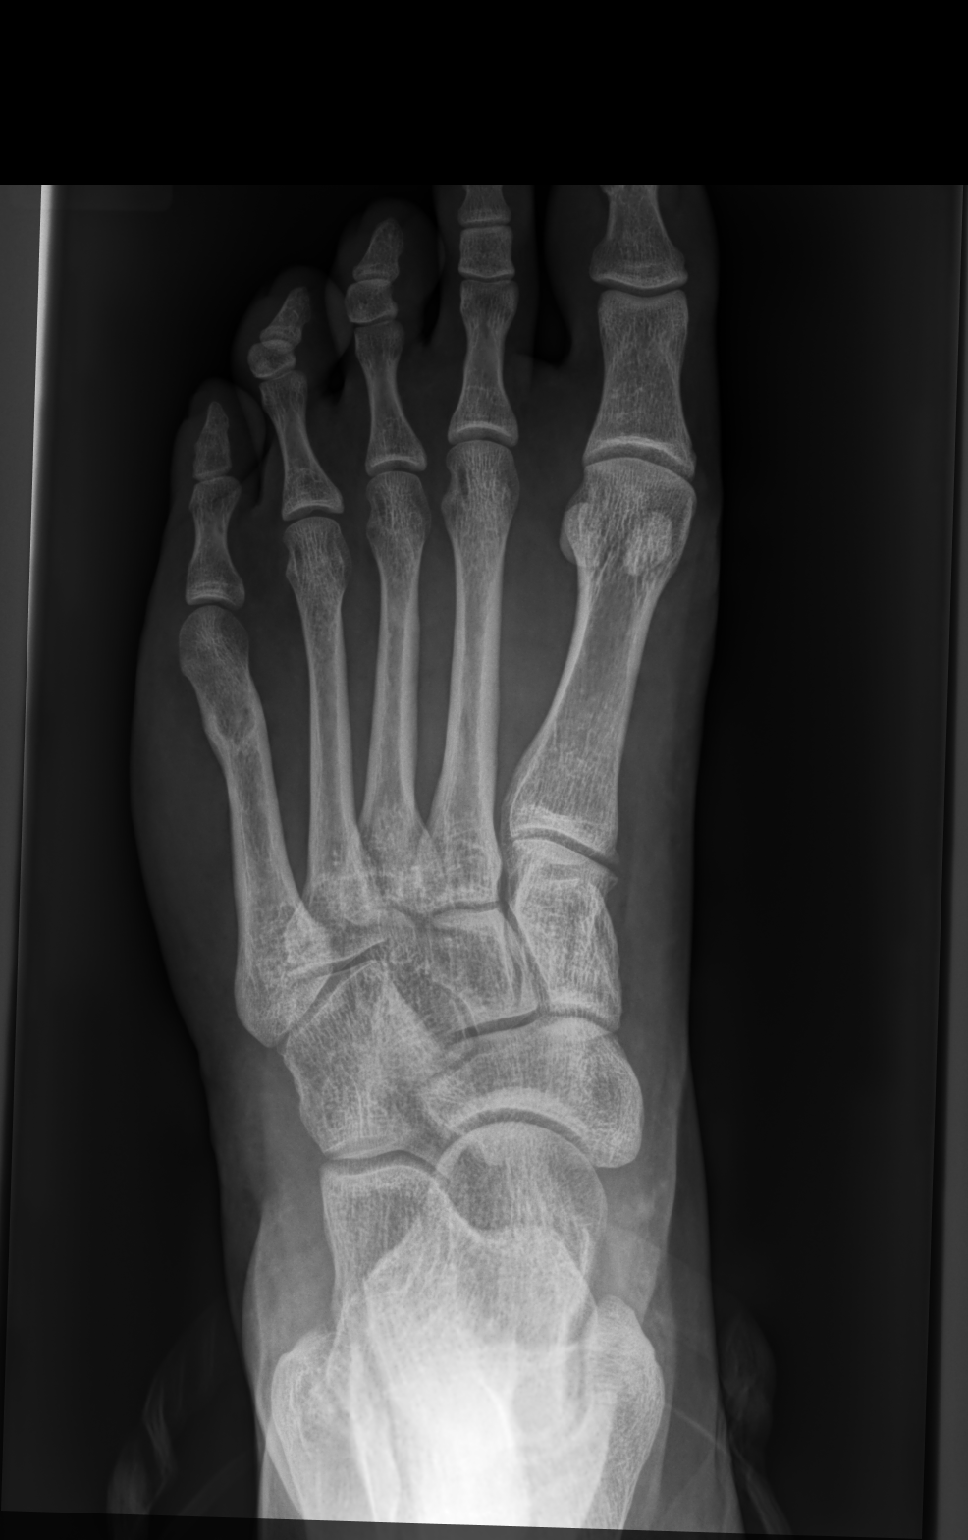

[foot medial oblique]
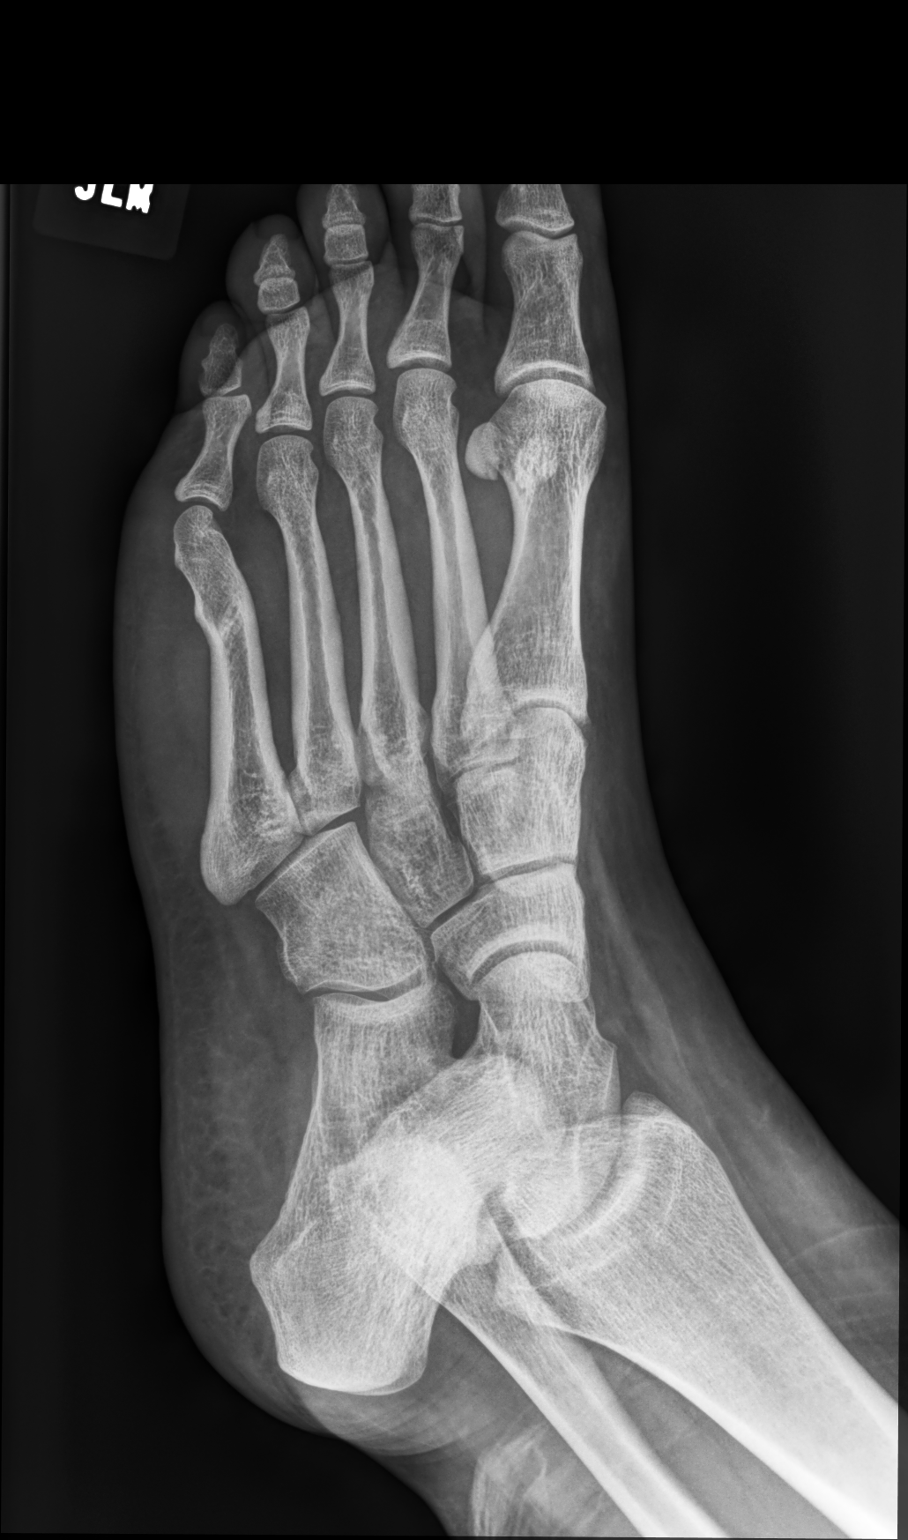

[foot supine lat]
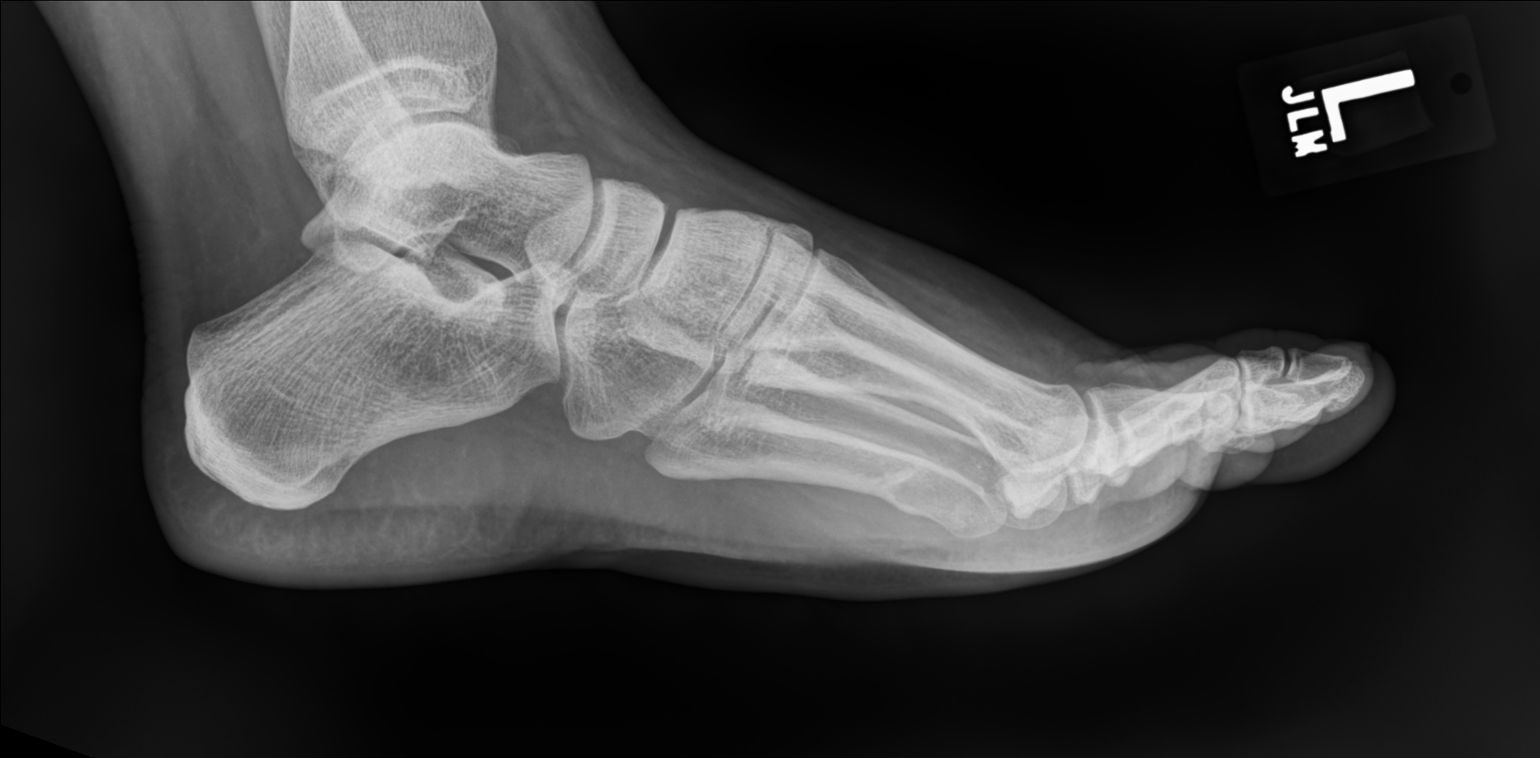

[3 of 3 positions shown; findings below may reference images not displayed]

FINDINGS: No signs of acute fracture or dislocation. Remote healed fracture
deformity is noted involving the distal shaft of the fifth mild
dorsal soft tissue swelling overlying the metatarsal bones. No
underlying fracture. Metatarsal bone.
IMPRESSION: 1. No acute bone abnormality.
2. Remote healed fracture deformity involving the distal shaft of
the fifth metatarsal.

## 2020-10-26 MED ORDER — NAPROXEN 500 MG PO TABS: 500.0000 mg | ORAL_TABLET | Freq: Two times a day (BID) | ORAL | 0 refills | Status: DC

## 2020-10-26 NOTE — ED Triage Notes (Signed)
Stepped on left foot wrong on Sunday, still painful today. Walking aggravates, rest relieves. Using compression stocking to help. Ibuprofen mildly helps relieves the pain

## 2020-10-26 NOTE — ED Provider Notes (Signed)
[facility]   MRN: [id number] DOB: [birth year]  Subjective:   Christy Bell is a 30 y.o. female presenting for 3-day history of acute onset persistent left foot pain.  Patient states that she rolled her foot and ankle and has since had some persistent intermittent left foot pain over the lateral aspect with slight swelling.  Has been using ibuprofen 400 mg 1-2 times a day without much relief.  Has also used a muscle relaxant at bedtime.  She is able to walk and bear weight but has pain with every step.  Has been using compression as well with some relief.  No current facility-administered medications for this encounter.  Current Outpatient Medications:    acetaminophen (TYLENOL) 500 MG tablet, Take 500 mg by mouth every 6 (six) hours as needed for moderate pain., Disp: , Rfl:    albuterol (VENTOLIN HFA) 108 (90 Base) MCG/ACT inhaler, Inhale 1-2 puffs into the lungs every 4 (four) hours as needed for wheezing or shortness of breath., Disp: , Rfl:    cetirizine (ZYRTEC) 10 MG tablet, Take 10 mg by mouth daily as needed for allergies., Disp: , Rfl:    hydrOXYzine (ATARAX/VISTARIL) 10 MG tablet, Take 10 mg by mouth at bedtime as needed for anxiety., Disp: , Rfl:    ibuprofen (ADVIL) 200 MG tablet, Take 200 mg by mouth every 6 (six) hours as needed for moderate pain., Disp: , Rfl:    naproxen (NAPROSYN) 500 MG tablet, Take 1 tablet (500 mg total) by mouth 2 (two) times daily., Disp: 30 tablet, Rfl: 0   sertraline (ZOLOFT) 50 MG tablet, Take 50 mg by mouth daily., Disp: , Rfl:    SYMBICORT 160-4.5 MCG/ACT inhaler, Inhale 2 puffs into the lungs daily as needed (sob/wheezing). , Disp: , Rfl:    No Known Allergies  Past Medical History:  Diagnosis Date   Anxiety    Asthma    Bronchitis    Depression    Fatty liver    GERD (gastroesophageal reflux disease)    Gestational diabetes    Headache    Hx of chlamydia infection    Preeclampsia    Resolved after childbirth      Past Surgical History:  Procedure Laterality Date   CESAREAN SECTION N/A 09/07/2014   Procedure: CESAREAN SECTION;  Surgeon: Essie Hart, MD;  Location: [facility];  Service: Obstetrics;  Laterality: N/A;   CESAREAN SECTION N/A 02/26/2017   Procedure: REPEAT CESAREAN SECTION;  Surgeon: Essie Hart, MD;  Location: [facility];  Service: Obstetrics;  Laterality: N/A;  Tracey RNFA   CHOLECYSTECTOMY N/A 10/29/2019   Procedure: LAPAROSCOPIC CHOLECYSTECTOMY;  Surgeon: Almond Lint, MD;  Location: [facility];  Service: General;  Laterality: N/A;   GALLBLADDER SURGERY     WISDOM TOOTH EXTRACTION     x1    Family History  Problem Relation Age of Onset   Mental illness Father    Hypertension Maternal Grandmother    Heart disease Maternal Grandmother    Mental illness Maternal Grandmother    Diabetes Paternal Grandmother     Social History   Tobacco Use   Smoking status: Former    Types: Cigarettes    Quit date: 02/07/2014    Years since quitting: 6.7   Smokeless tobacco: Never  Vaping Use   Vaping Use: Never used  Substance Use Topics   Alcohol use: No    Alcohol/week: 0.0 standard drinks    Comment: Social   Drug use: No    ROS  Objective:   Vitals: BP 110/76 (BP Location: Left Arm)   Pulse 94   Temp 98.5 F (36.9 C) (Oral)   Resp 16   SpO2 97%   Physical Exam Constitutional:      General: She is not in acute distress.    Appearance: Normal appearance. She is well-developed. She is not ill-appearing, toxic-appearing or diaphoretic.  HENT:     Head: Normocephalic and atraumatic.     Nose: Nose normal.     Mouth/Throat:     Mouth: Mucous membranes are moist.     Pharynx: Oropharynx is clear.  Eyes:     General: No scleral icterus.       Right eye: No discharge.        Left eye: No discharge.     Extraocular Movements: Extraocular movements intact.     Conjunctiva/sclera: Conjunctivae normal.     Pupils: Pupils are equal, round, and reactive to light.   Cardiovascular:     Rate and Rhythm: Normal rate.  Pulmonary:     Effort: Pulmonary effort is normal.  Musculoskeletal:     Left ankle: No swelling, deformity, ecchymosis or lacerations. No tenderness. Normal range of motion.     Left Achilles Tendon: No tenderness or defects. Thompson's test negative.     Left foot: Normal range of motion and normal capillary refill. Tenderness and bony tenderness present. No swelling, deformity, laceration or crepitus.       Feet:  Skin:    General: Skin is warm and dry.  Neurological:     General: No focal deficit present.     Mental Status: She is alert and oriented to person, place, and time.     Motor: No weakness.     Coordination: Coordination normal.     Gait: Gait normal.     Deep Tendon Reflexes: Reflexes normal.  Psychiatric:        Mood and Affect: Mood normal.        Behavior: Behavior normal.        Thought Content: Thought content normal.        Judgment: Judgment normal.    DG Foot Complete Left  Result Date: 10/26/2020 CLINICAL DATA:  Left foot pain. EXAM: LEFT FOOT - COMPLETE 3+ VIEW COMPARISON:  None. FINDINGS: No signs of acute fracture or dislocation. Remote healed fracture deformity is noted involving the distal shaft of the fifth mild dorsal soft tissue swelling overlying the metatarsal bones. No underlying fracture. Metatarsal bone. IMPRESSION: 1. No acute bone abnormality. 2. Remote healed fracture deformity involving the distal shaft of the fifth metatarsal. Electronically Signed   By: Signa Kell M.D.   On: 10/26/2020 12:20     Assessment and Plan :   PDMP not reviewed this encounter.  1. Foot sprain, left, initial encounter   2. Left foot pain     Recommended conservative management for left wrist sprain with naproxen, postop shoe.  Advised patient that she has a remote healed fracture of the left distal shaft of the fifth metatarsal.  Does not believe she ever have this addressed.  She is agreeable to our  current management. Counseled patient on potential for adverse effects with medications prescribed/recommended today, ER and return-to-clinic precautions discussed, patient verbalized understanding.    Wallis Bamberg, PA-C 10/26/20 1231

## 2021-03-22 ENCOUNTER — Ambulatory Visit (INDEPENDENT_AMBULATORY_CARE_PROVIDER_SITE_OTHER): Payer: Medicaid Other

## 2021-03-22 ENCOUNTER — Ambulatory Visit
Admission: EM | Admit: 2021-03-22 | Discharge: 2021-03-22 | Disposition: A | Discharge status: Home / Self Care | Payer: Medicaid Other | Attending: Internal Medicine | Admitting: Internal Medicine

## 2021-03-22 ENCOUNTER — Other Ambulatory Visit: Payer: Self-pay

## 2021-03-22 DIAGNOSIS — M25532 Pain in left wrist: Secondary | ICD-10-CM | POA: Diagnosis not present

## 2021-03-22 DIAGNOSIS — J069 Acute upper respiratory infection, unspecified: Secondary | ICD-10-CM

## 2021-03-22 DIAGNOSIS — R059 Cough, unspecified: Secondary | ICD-10-CM

## 2021-03-22 DIAGNOSIS — R053 Chronic cough: Secondary | ICD-10-CM

## 2021-03-22 IMAGING — DX DG WRIST COMPLETE 3+V*L*
4 series · 4 of 4 positions shown · non-contrast
Comparison: None.

CLINICAL DATA: Wrist pain

EXAM:
LEFT WRIST - COMPLETE 3+ VIEW

[wrist pa (1 of 3)]
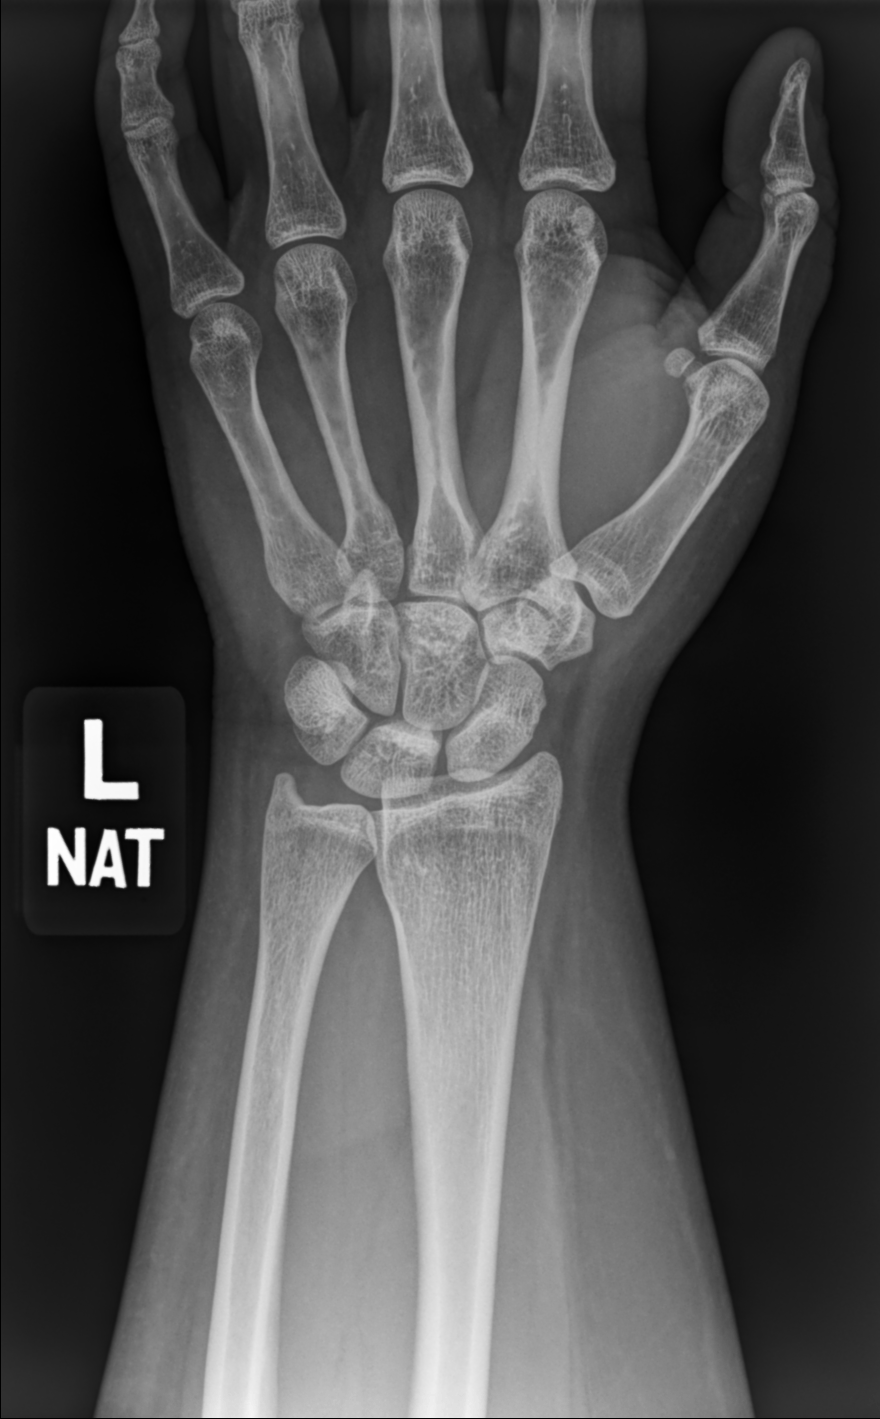

[wrist pa (2 of 3)]
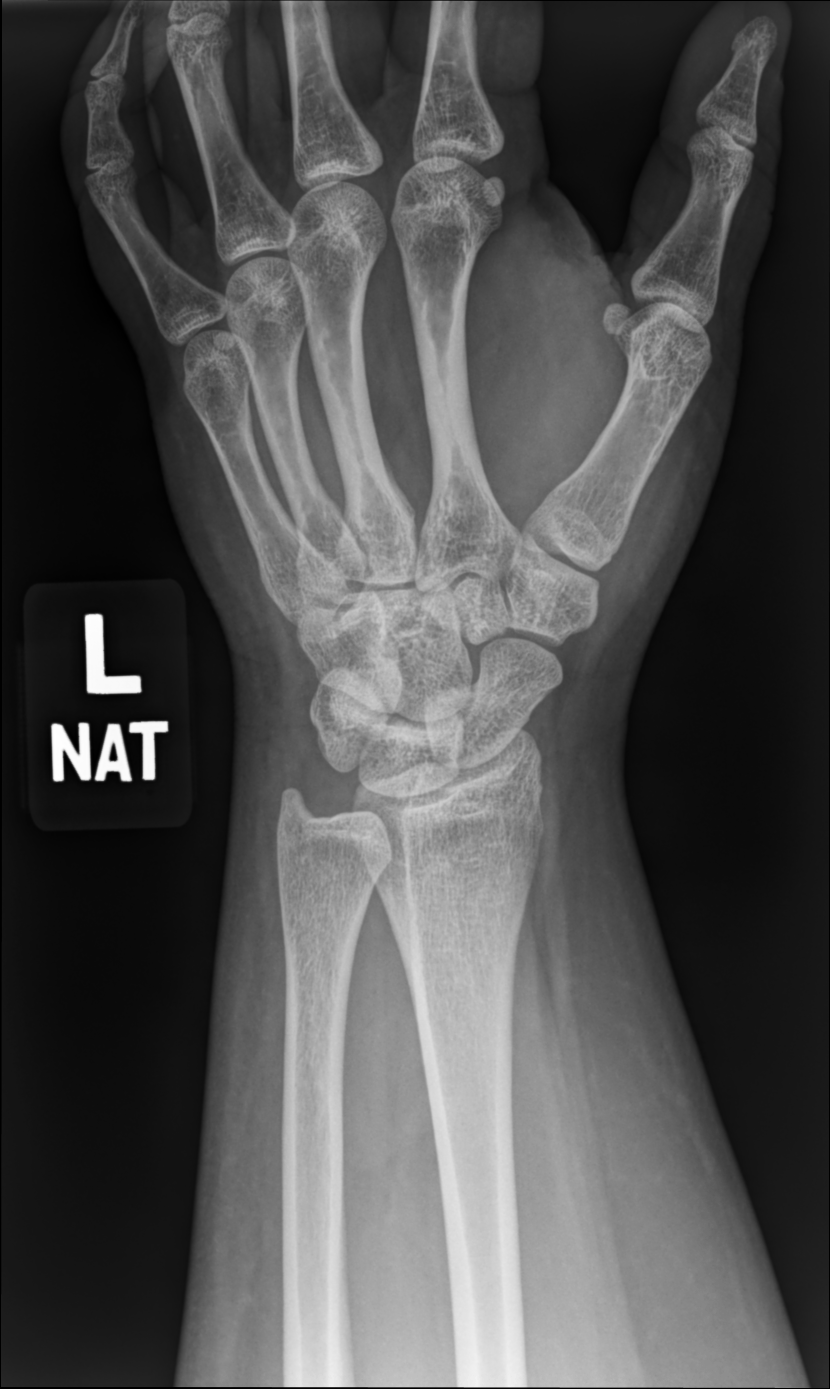

[wrist lat]
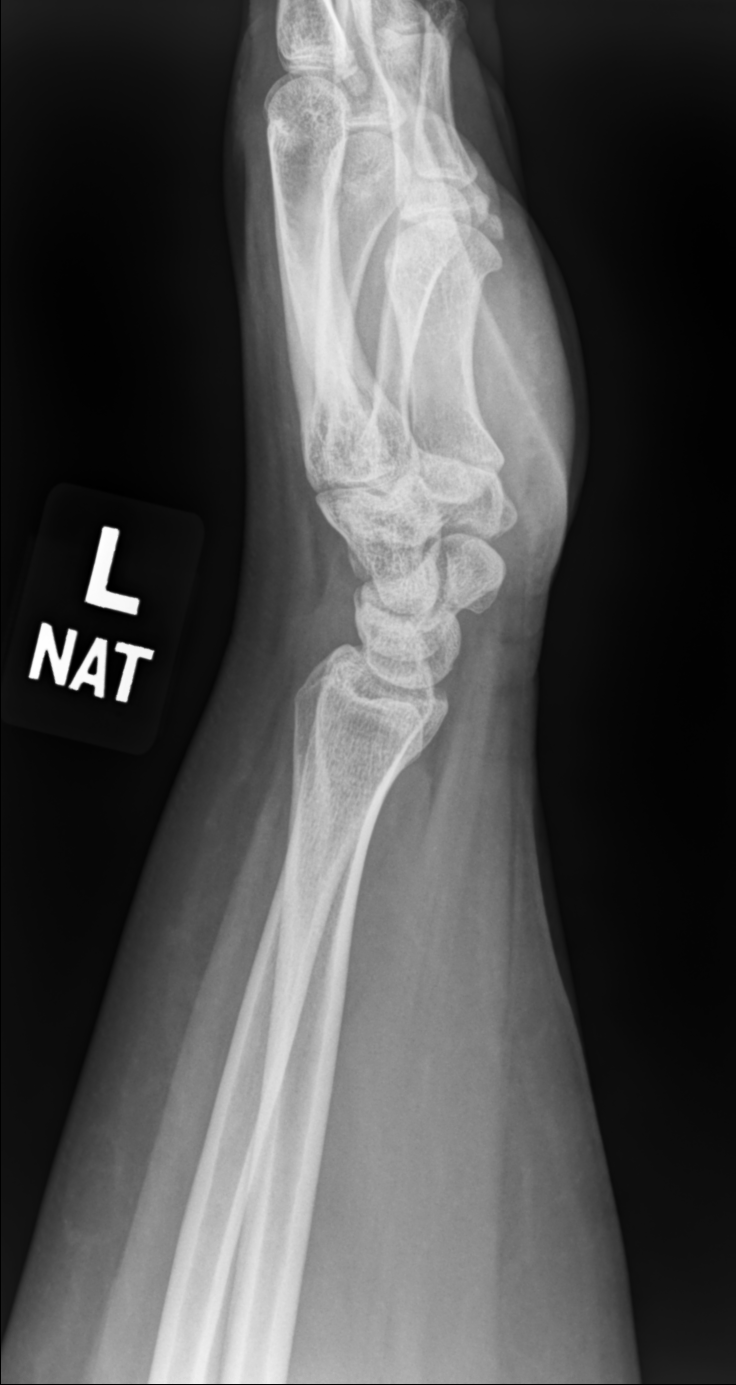

[wrist pa (3 of 3)]
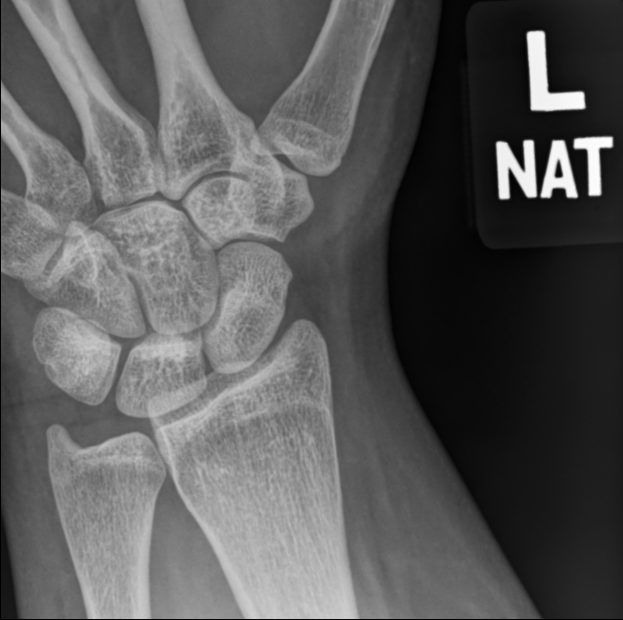

[4 of 4 positions shown; findings below may reference images not displayed]

FINDINGS: There is no evidence of fracture or dislocation. There is no
evidence of arthropathy or other focal bone abnormality. Soft
tissues are unremarkable.
IMPRESSION: No acute osseous abnormality identified.

## 2021-03-22 IMAGING — DX DG CHEST 2V
2 series · 2 of 2 positions shown · non-contrast
Comparison: March 31, 2020.

CLINICAL DATA: Cough.

EXAM:
CHEST - 2 VIEW

[chest pa]
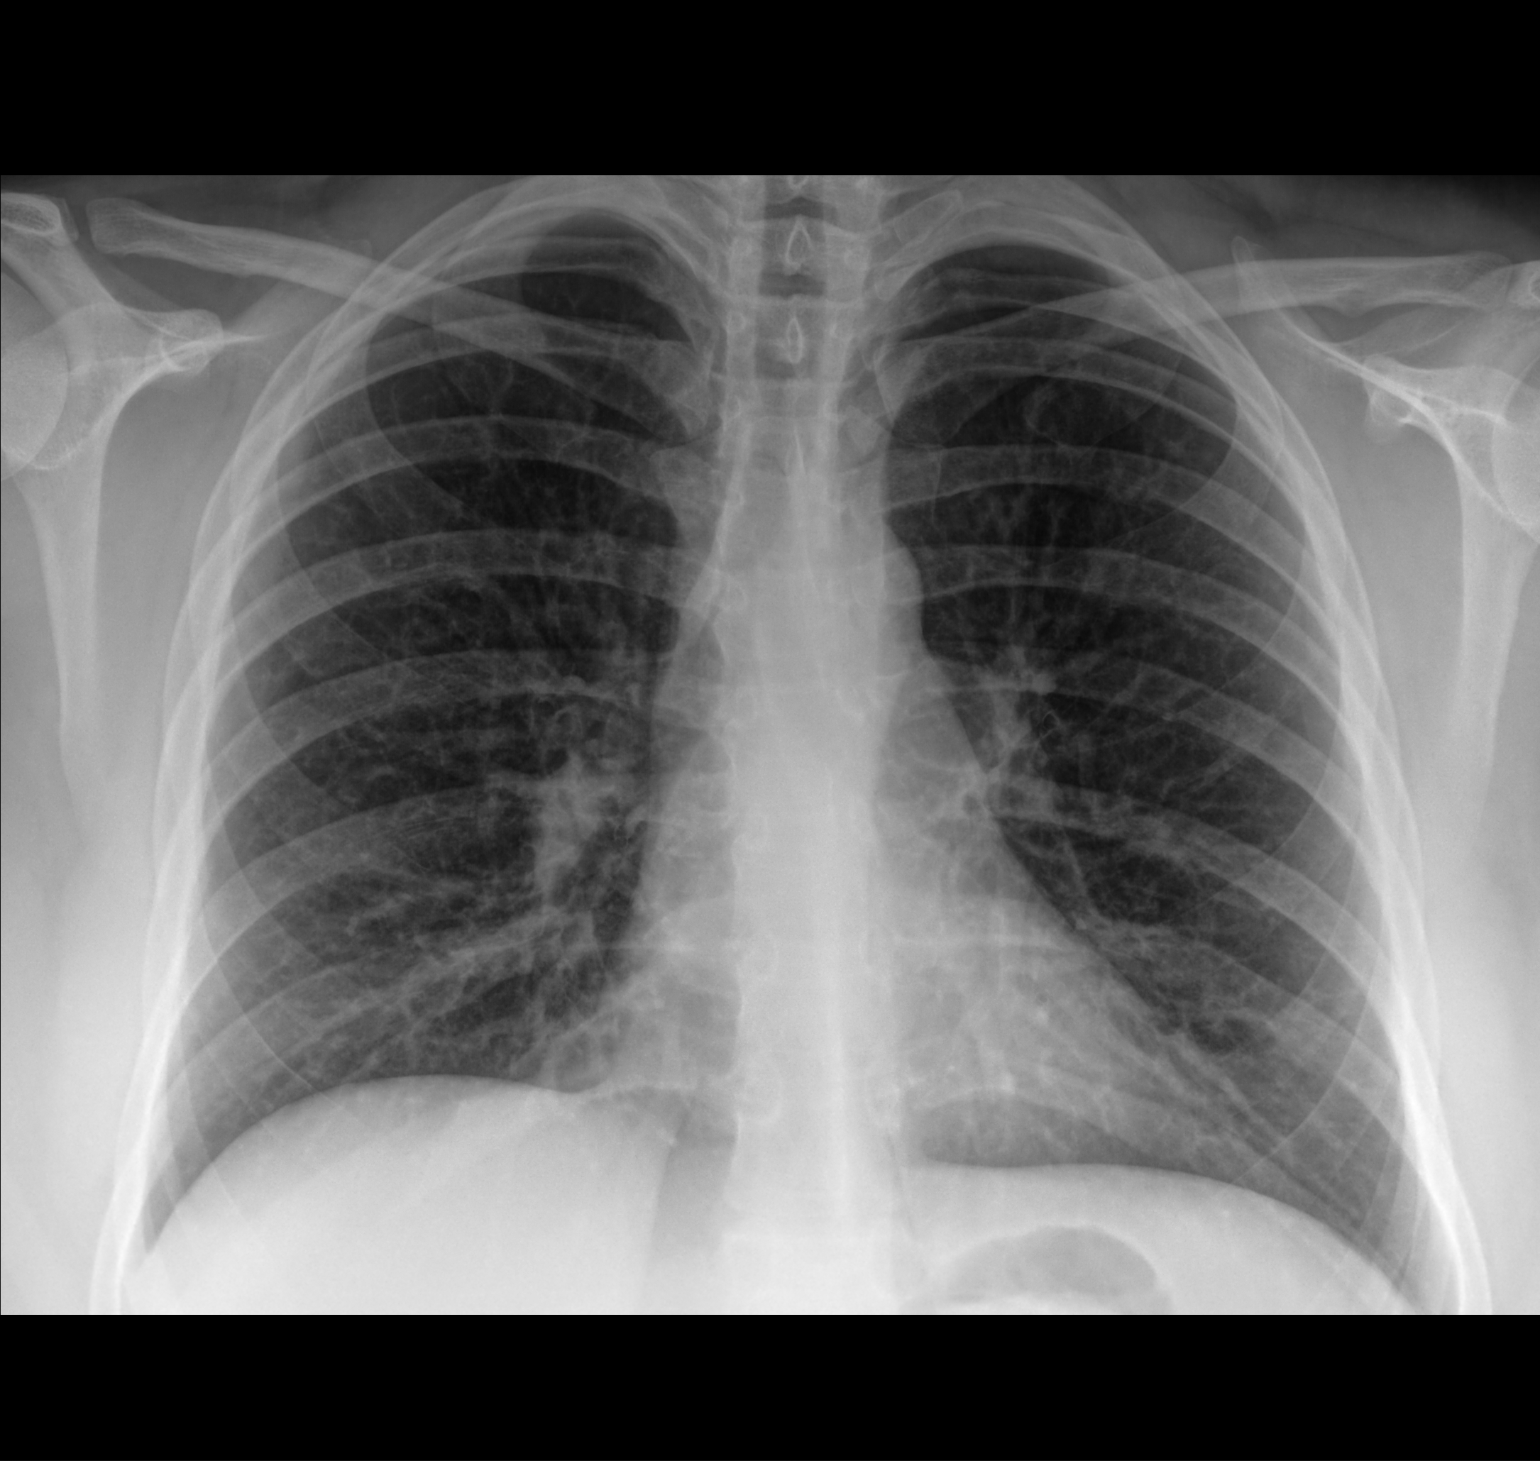

[chest lat]
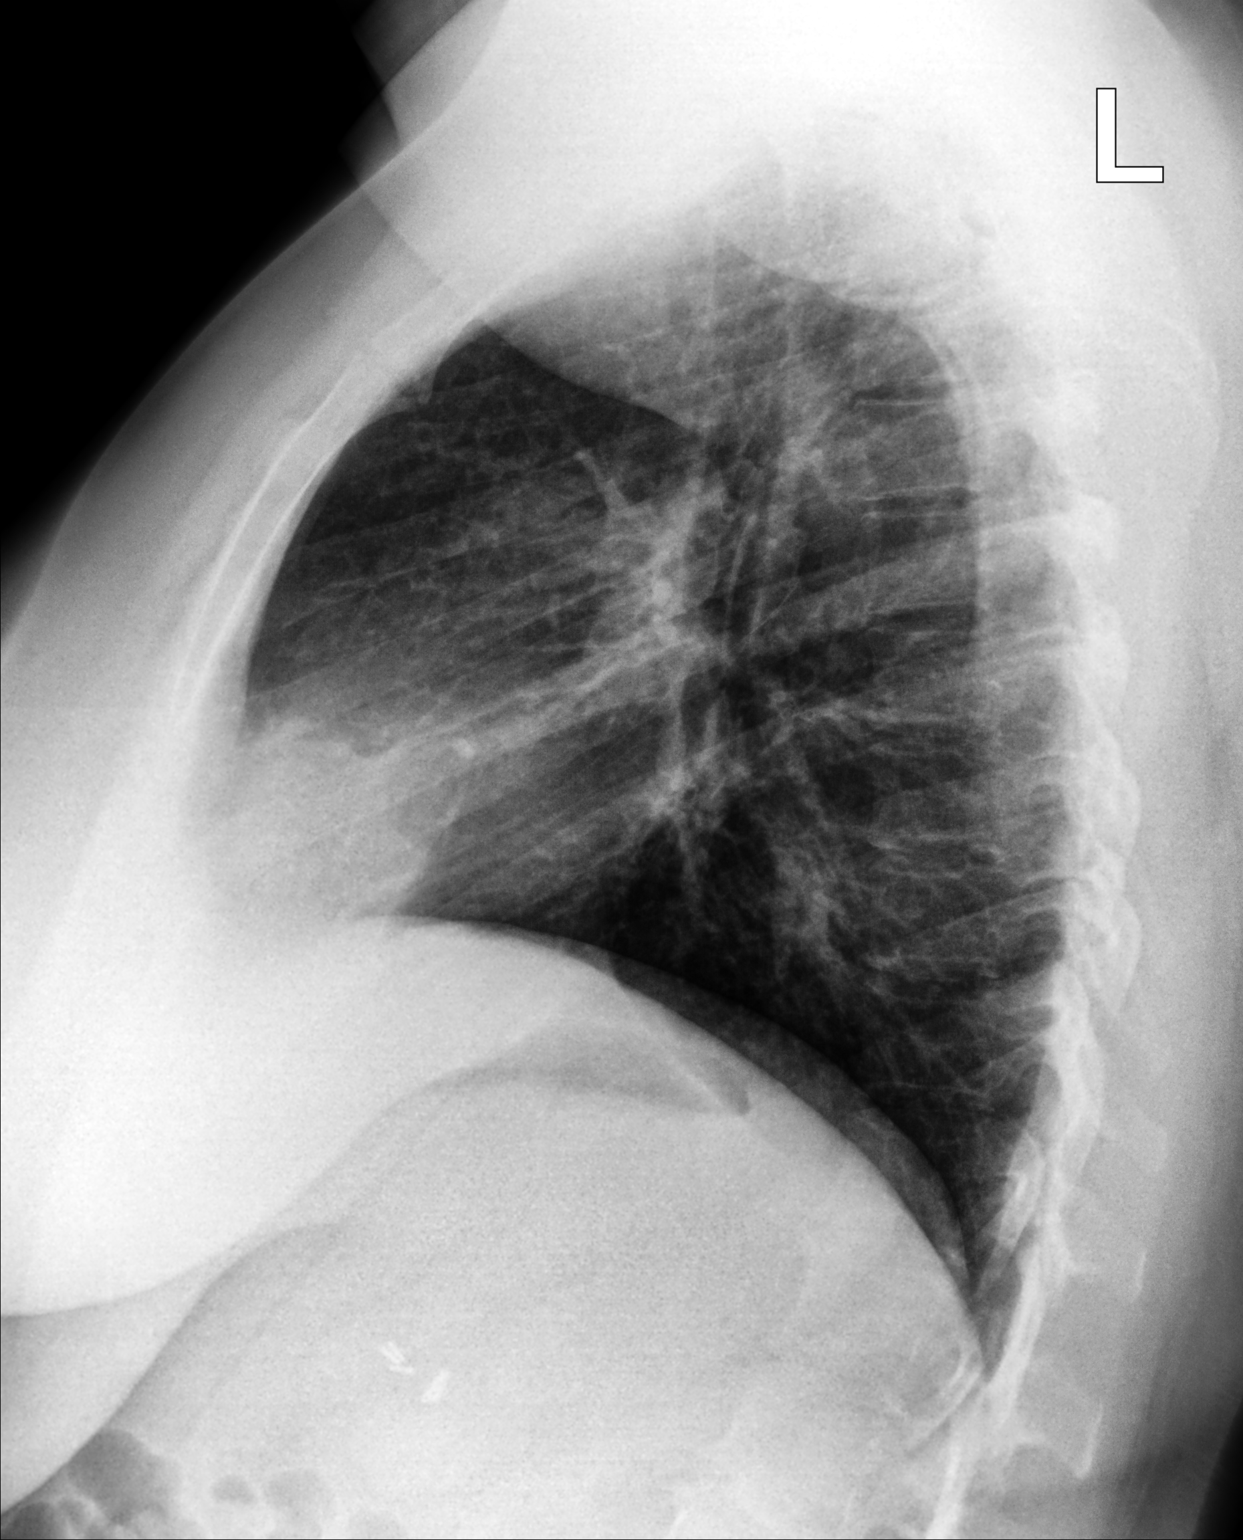

[2 of 2 positions shown; findings below may reference images not displayed]

FINDINGS: The heart size and mediastinal contours are within normal limits.
Both lungs are clear. The visualized skeletal structures are
unremarkable.
IMPRESSION: No active cardiopulmonary disease.

## 2021-03-22 MED ORDER — AMOXICILLIN 400 MG/5ML PO SUSR: 875.0000 mg | Freq: Two times a day (BID) | ORAL | 0 refills | Status: AC

## 2021-03-22 MED ORDER — BENZONATATE 100 MG PO CAPS: 100.0000 mg | ORAL_CAPSULE | Freq: Three times a day (TID) | ORAL | 0 refills | Status: DC | PRN

## 2021-03-22 MED ORDER — PREDNISONE 20 MG PO TABS: 40.0000 mg | ORAL_TABLET | Freq: Every day | ORAL | 0 refills | Status: AC

## 2021-03-22 NOTE — ED Provider Notes (Signed)
[facility]    CSN: [id number] Arrival date & time: 03/22/21  0940      History   Chief Complaint Chief Complaint  Patient presents with   Cough   Wrist Pain    left    HPI Christy Bell is a 31 y.o. female.   Patient presents with 1 month history of cough and nasal congestion.  Her daughter also has similar duration and characteristic of symptoms.  Patient has taken over-the-counter cough medication and has been using her albuterol inhaler with temporary relief.  She does have history of asthma.  Denies chest pain, sore throat, ear pain, nausea, vomiting, diarrhea, abdominal pain.  Denies any known fevers at home.  She is also complaining of left wrist pain and swelling that started approximately 3 to 4 days ago.  She does report that she slipped and fell while walking the dog approximately 1.5 months ago.  When she fell, she reached up to grab the railing and "stretched her wrist".  Denies any other apparent injuries prior to symptoms starting. Denies any numbness or tingling to left wrist.  She has been using wrist brace for pain with minimal improvement.   Cough Wrist Pain   Past Medical History:  Diagnosis Date   Anxiety    Asthma    Bronchitis    Depression    Fatty liver    GERD (gastroesophageal reflux disease)    Gestational diabetes    Headache    Hx of chlamydia infection    Preeclampsia    Resolved after childbirth    Patient Active Problem List   Diagnosis Date Noted   Status post repeat low transverse cesarean section 02/26/2017    Past Surgical History:  Procedure Laterality Date   CESAREAN SECTION N/A 09/07/2014   Procedure: CESAREAN SECTION;  Surgeon: Sanjuana Kava, MD;  Location: [facility];  Service: Obstetrics;  Laterality: N/A;   CESAREAN SECTION N/A 02/26/2017   Procedure: REPEAT CESAREAN SECTION;  Surgeon: Sanjuana Kava, MD;  Location: [place];  Service: Obstetrics;  Laterality: N/A;  Tracey RNFA   CHOLECYSTECTOMY N/A  10/29/2019   Procedure: LAPAROSCOPIC CHOLECYSTECTOMY;  Surgeon: Stark Klein, MD;  Location: [facility];  Service: General;  Laterality: N/A;   GALLBLADDER SURGERY     WISDOM TOOTH EXTRACTION     x1    OB History     Gravida  2   Para  1   Term      Preterm  1   AB      Living  1      SAB      IAB      Ectopic      Multiple  0   Live Births  1            Home Medications    Prior to Admission medications   Medication Sig Start Date End Date Taking? Authorizing Provider  amoxicillin (AMOXIL) 400 MG/5ML suspension Take 10.9 mLs (875 mg total) by mouth 2 (two) times daily for 10 days. 03/22/21 04/01/21 Yes Cailee Blanke, Michele Rockers, FNP  benzonatate (TESSALON) 100 MG capsule Take 1 capsule (100 mg total) by mouth every 8 (eight) hours as needed for cough. 03/22/21  Yes Laketta Soderberg, Hildred Alamin E, FNP  predniSONE (DELTASONE) 20 MG tablet Take 2 tablets (40 mg total) by mouth daily for 5 days. 03/22/21 03/27/21 Yes Yuli Lanigan, Michele Rockers, FNP  acetaminophen (TYLENOL) 500 MG tablet Take 500 mg by mouth every 6 (six) hours as needed  for moderate pain.    [provider]  albuterol (VENTOLIN HFA) 108 (90 Base) MCG/ACT inhaler Inhale 1-2 puffs into the lungs every 4 (four) hours as needed for wheezing or shortness of breath. 08/31/19   [provider]  cetirizine (ZYRTEC) 10 MG tablet Take 10 mg by mouth daily as needed for allergies. 08/31/19   [provider]  hydrOXYzine (ATARAX/VISTARIL) 10 MG tablet Take 10 mg by mouth at bedtime as needed for anxiety. 09/23/19   [provider]  ibuprofen (ADVIL) 200 MG tablet Take 200 mg by mouth every 6 (six) hours as needed for moderate pain.    [provider]  naproxen (NAPROSYN) 500 MG tablet Take 1 tablet (500 mg total) by mouth 2 (two) times daily. 10/26/20   Wallis Bamberg, PA-C  sertraline (ZOLOFT) 50 MG tablet Take 50 mg by mouth daily.    [provider]  SYMBICORT 160-4.5 MCG/ACT inhaler Inhale 2 puffs into the  lungs daily as needed (sob/wheezing).  09/21/19   [provider]    Family History Family History  Problem Relation Age of Onset   Healthy Mother    Mental illness Father    Hypertension Maternal Grandmother    Heart disease Maternal Grandmother    Mental illness Maternal Grandmother    Diabetes Paternal Grandmother     Social History Social History   Tobacco Use   Smoking status: Former    Types: Cigarettes    Quit date: 02/07/2014    Years since quitting: 7.1   Smokeless tobacco: Never  Vaping Use   Vaping Use: Never used  Substance Use Topics   Alcohol use: No    Alcohol/week: 0.0 standard drinks    Comment: Social   Drug use: No     Allergies   Patient has no known allergies.   Review of Systems Review of Systems Per HPI  Physical Exam Triage Vital Signs ED Triage Vitals  Enc Vitals Group     BP 03/22/21 0956 120/84     Pulse Rate 03/22/21 0956 90     Resp 03/22/21 0956 18     Temp 03/22/21 0956 98.6 F (37 C)     Temp Source 03/22/21 0956 Oral     SpO2 03/22/21 0956 95 %     Weight --      Height --      Head Circumference --      Peak Flow --      Pain Score 03/22/21 0958 0     Pain Loc --      Pain Edu? --      Excl. in GC? --    No data found.  Updated Vital Signs BP 120/84 (BP Location: Left Arm)    Pulse 90    Temp 98.6 F (37 C) (Oral)    Resp 18    SpO2 95%   Visual Acuity Right Eye Distance:   Left Eye Distance:   Bilateral Distance:    Right Eye Near:   Left Eye Near:    Bilateral Near:     Physical Exam Constitutional:      General: She is not in acute distress.    Appearance: Normal appearance. She is not toxic-appearing or diaphoretic.  HENT:     Head: Normocephalic and atraumatic.     Right Ear: Tympanic membrane and ear canal normal.     Left Ear: Tympanic membrane and ear canal normal.     Nose: Congestion present.  Mouth/Throat:     Mouth: Mucous membranes are moist.     Pharynx: No posterior  oropharyngeal erythema.  Eyes:     Extraocular Movements: Extraocular movements intact.     Conjunctiva/sclera: Conjunctivae normal.     Pupils: Pupils are equal, round, and reactive to light.  Cardiovascular:     Rate and Rhythm: Normal rate and regular rhythm.     Pulses: Normal pulses.     Heart sounds: Normal heart sounds.  Pulmonary:     Effort: Pulmonary effort is normal. No respiratory distress.     Breath sounds: Normal breath sounds. No stridor. No wheezing, rhonchi or rales.  Abdominal:     General: Abdomen is flat. Bowel sounds are normal.     Palpations: Abdomen is soft.  Musculoskeletal:        General: Normal range of motion.     Right wrist: Normal.     Left wrist: Tenderness present. No swelling, deformity, bony tenderness, snuff box tenderness or crepitus. Normal range of motion. Normal pulse.     Cervical back: Normal range of motion.     Comments: No tenderness to palpation generalized throughout left wrist.  Patient reports that pain occurs with range of motion.  Grip strength 5/5.  Neurovascular intact.  Skin:    General: Skin is warm and dry.  Neurological:     General: No focal deficit present.     Mental Status: She is alert and oriented to person, place, and time. Mental status is at baseline.  Psychiatric:        Mood and Affect: Mood normal.        Behavior: Behavior normal.     UC Treatments / Results  Labs (all labs ordered are listed, but only abnormal results are displayed) Labs Reviewed - No data to display  EKG   Radiology DG Chest 2 View  Result Date: 03/22/2021 CLINICAL DATA:  Cough. EXAM: CHEST - 2 VIEW COMPARISON:  March 31, 2020. FINDINGS: The heart size and mediastinal contours are within normal limits. Both lungs are clear. The visualized skeletal structures are unremarkable. IMPRESSION: No active cardiopulmonary disease. Electronically Signed   By: Marijo Conception M.D.   On: 03/22/2021 10:57   DG Wrist Complete Left  Result  Date: 03/22/2021 CLINICAL DATA:  Wrist pain EXAM: LEFT WRIST - COMPLETE 3+ VIEW COMPARISON:  None. FINDINGS: There is no evidence of fracture or dislocation. There is no evidence of arthropathy or other focal bone abnormality. Soft tissues are unremarkable. IMPRESSION: No acute osseous abnormality identified. Electronically Signed   By: Ofilia Neas M.D.   On: 03/22/2021 10:59    Procedures Procedures (including critical care time)  Medications Ordered in UC Medications - No data to display  Initial Impression / Assessment and Plan / UC Course  I have reviewed the triage vital signs and the nursing notes.  Pertinent labs & imaging results that were available during my care of the patient were reviewed by me and considered in my medical decision making (see chart for details).     Chest x-ray was negative for any acute cardiopulmonary process.  Suspect possible asthma exacerbation with patient's history of asthma and having to use her albuterol inhaler.  Will prescribe prednisone steroid to decrease inflammation.  I do think that patient would benefit from antibiotic given duration of symptoms and green nasal drainage as well.  Discussed supportive care and symptom management patient.  Left wrist x-ray was negative for any acute bony abnormality.  Suspect wrist sprain from possible fall.  Prednisone should help decrease inflammation associated with this as well.  Discussed ice application with patient.  Patient to follow-up with provided contact relation for orthopedist for further evaluation and management.  Discussed return precautions.  Patient verbalized understanding and was agreeable with plan. Final Clinical Impressions(s) / UC Diagnoses   Final diagnoses:  Persistent cough for 3 weeks or longer  Acute upper respiratory infection  Left wrist pain     Discharge Instructions      You have been prescribed prednisone steroid which helps alleviate inflammation associated with your  persistent cough as well as your left wrist pain.  Please continue wrist brace and use ice application.  Follow-up with provided contact admission for orthopedist for further evaluation and management of your wrist pain.  An antibiotic has been prescribed for your upper respiratory symptoms.  Cough medication has also prescribed to take as needed.     ED Prescriptions     Medication Sig Dispense Auth. Provider   predniSONE (DELTASONE) 20 MG tablet Take 2 tablets (40 mg total) by mouth daily for 5 days. 10 tablet [place], Amelia E, [place]   amoxicillin (AMOXIL) 400 MG/5ML suspension Take 10.9 mLs (875 mg total) by mouth 2 (two) times daily for 10 days. 218 mL Devin Ganaway, [place] E, [place]   benzonatate (TESSALON) 100 MG capsule Take 1 capsule (100 mg total) by mouth every 8 (eight) hours as needed for cough. 21 capsule [place], Michele Rockers, [place]      PDMP not reviewed this encounter.   Teodora Medici, Patrick 03/22/21 1122

## 2021-03-22 NOTE — Discharge Instructions (Signed)
You have been prescribed prednisone steroid which helps alleviate inflammation associated with your persistent cough as well as your left wrist pain.  Please continue wrist brace and use ice application.  Follow-up with provided contact admission for orthopedist for further evaluation and management of your wrist pain.  An antibiotic has been prescribed for your upper respiratory symptoms.  Cough medication has also prescribed to take as needed.

## 2021-03-22 NOTE — ED Triage Notes (Signed)
Approx 1 mo h/o productive cough and congestion. Pt reports that other URI sxs have resolved, but cough and congestion have persisted. Has been taking otc cough meds and using inhaler with temporary relief.  Pt also complains of a few days of left wrist pain and swelling. Has been wearing a brace. No falls or injuries at the onset.

## 2021-05-04 ENCOUNTER — Ambulatory Visit
Admission: EM | Admit: 2021-05-04 | Discharge: 2021-05-04 | Disposition: A | Discharge status: Home / Self Care | Payer: Medicaid Other | Attending: Internal Medicine | Admitting: Internal Medicine

## 2021-05-04 ENCOUNTER — Encounter: Payer: Self-pay | Admitting: Emergency Medicine

## 2021-05-04 ENCOUNTER — Ambulatory Visit (INDEPENDENT_AMBULATORY_CARE_PROVIDER_SITE_OTHER): Payer: Medicaid Other

## 2021-05-04 ENCOUNTER — Other Ambulatory Visit: Payer: Self-pay

## 2021-05-04 DIAGNOSIS — M25572 Pain in left ankle and joints of left foot: Secondary | ICD-10-CM | POA: Diagnosis not present

## 2021-05-04 DIAGNOSIS — M79672 Pain in left foot: Secondary | ICD-10-CM

## 2021-05-04 IMAGING — DX DG ANKLE COMPLETE 3+V*L*
3 series · 3 of 3 positions shown · non-contrast
Comparison: None.

CLINICAL DATA: Initial evaluation for acute pain at top of foot and
ankle.

EXAM:
LEFT ANKLE COMPLETE - 3+ VIEW

[ankle ap]
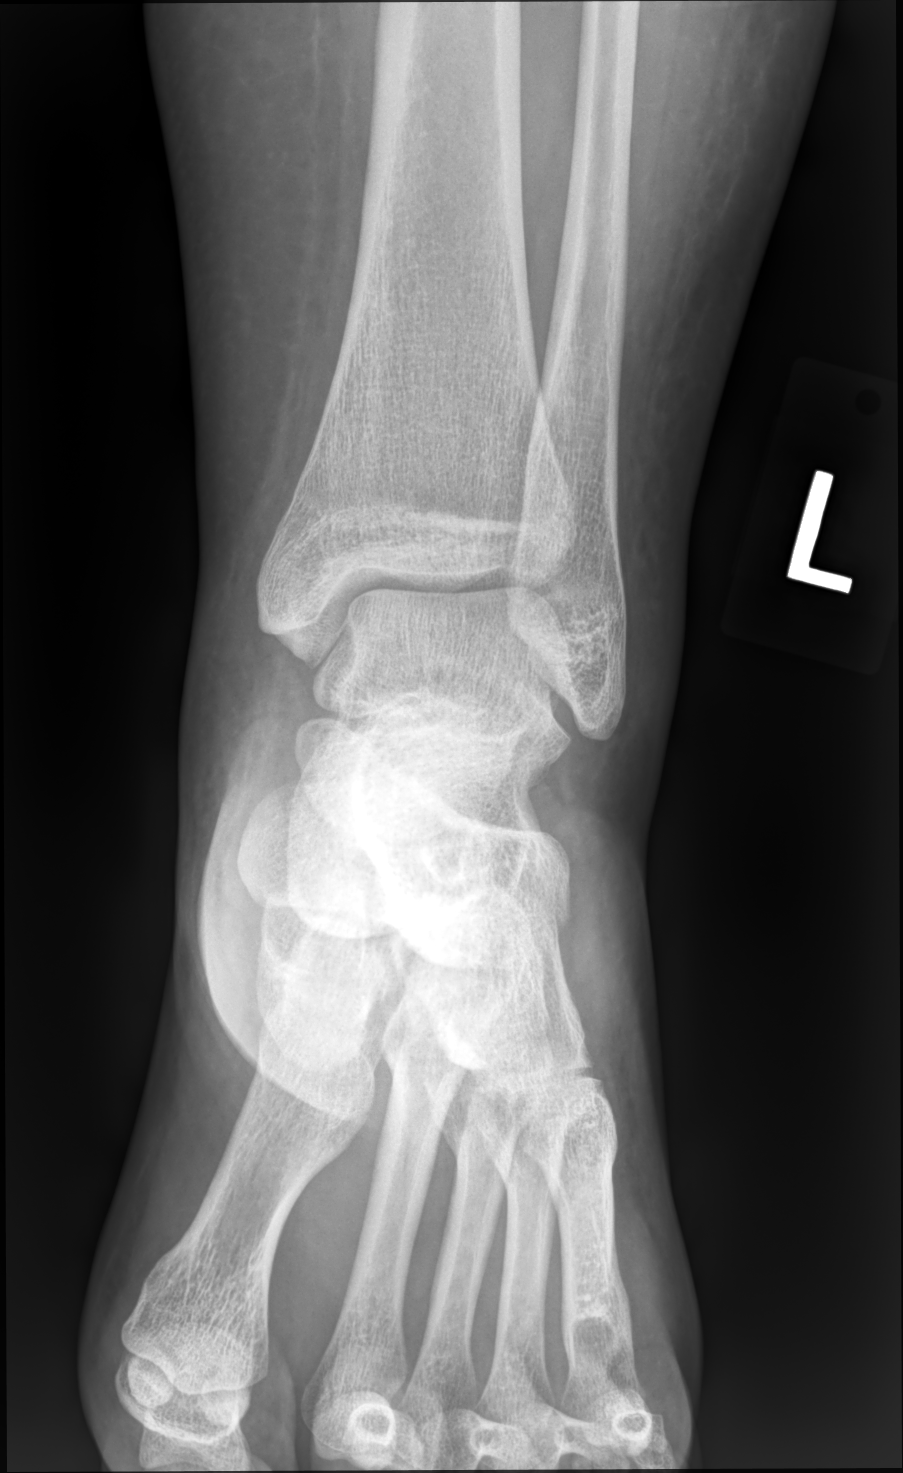

[ankle medial oblique]
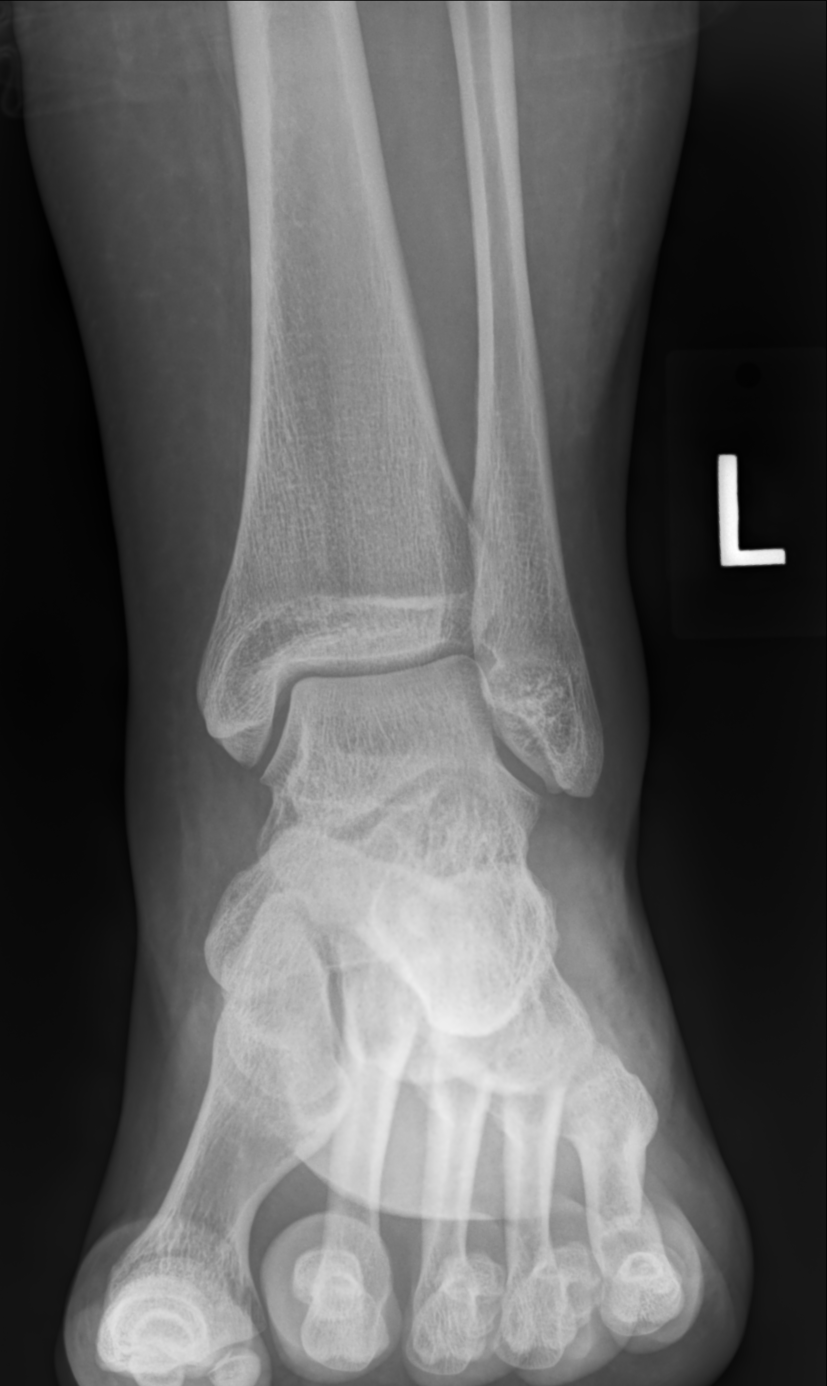

[ankle lat]
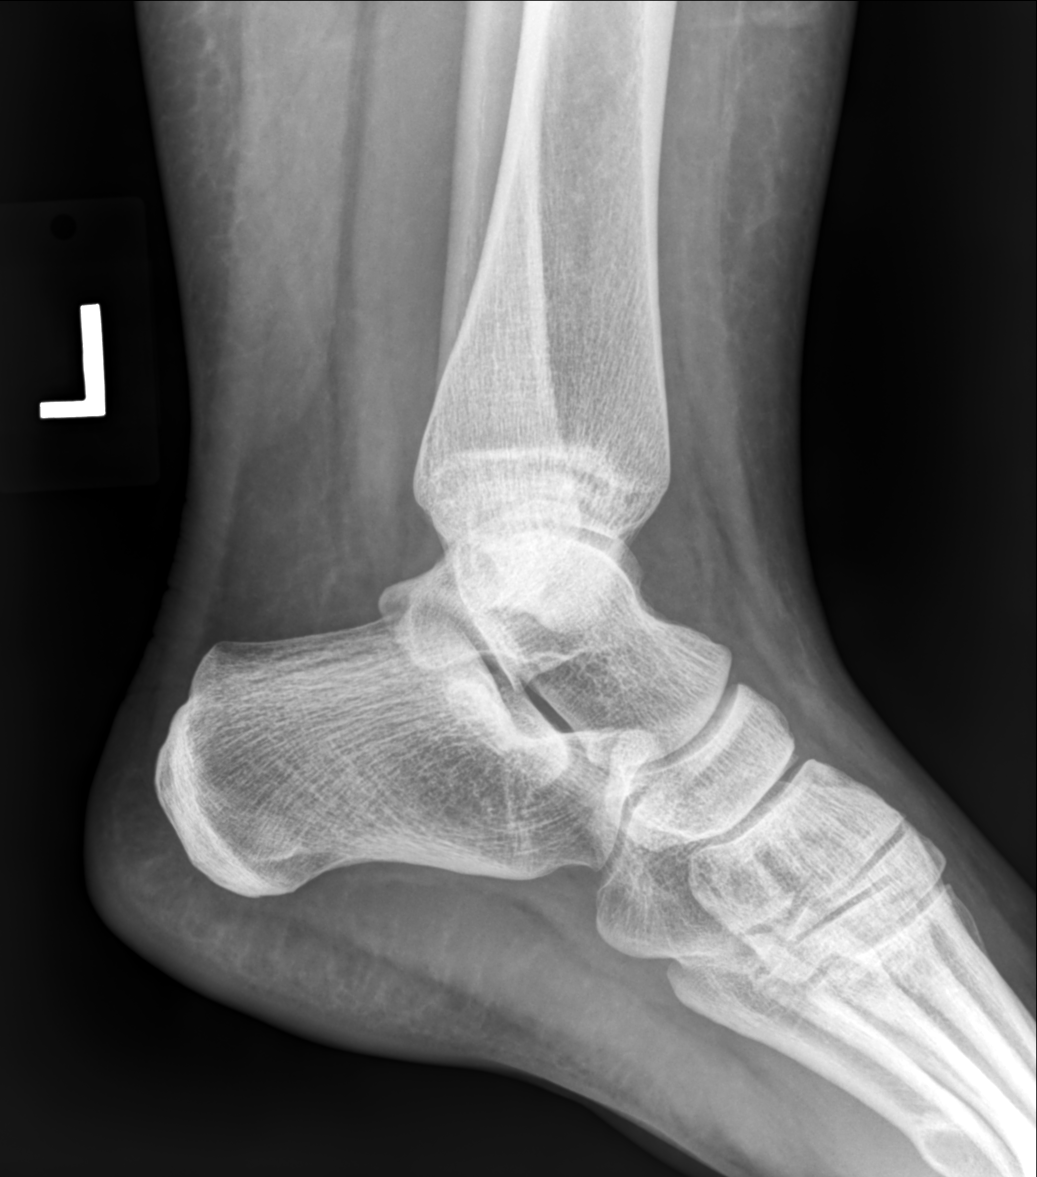

[3 of 3 positions shown; findings below may reference images not displayed]

FINDINGS: No acute fracture dislocation. Ankle mortise approximated. Talar
dome intact. No visible joint effusion. No significant
osteoarthritic changes about the ankle. Visualized soft tissues
within normal limits.
IMPRESSION: Normal radiograph of the left ankle. No acute osseous abnormality or
findings to explain patient's symptoms.

## 2021-05-04 IMAGING — DX DG FOOT COMPLETE 3+V*L*
3 series · 3 of 3 positions shown · non-contrast
Comparison: Prior radiograph from 10/26/2020.

CLINICAL DATA: Initial evaluation for acute pain at top of foot and
ankle.

EXAM:
LEFT FOOT - COMPLETE 3+ VIEW

[foot supine dp]
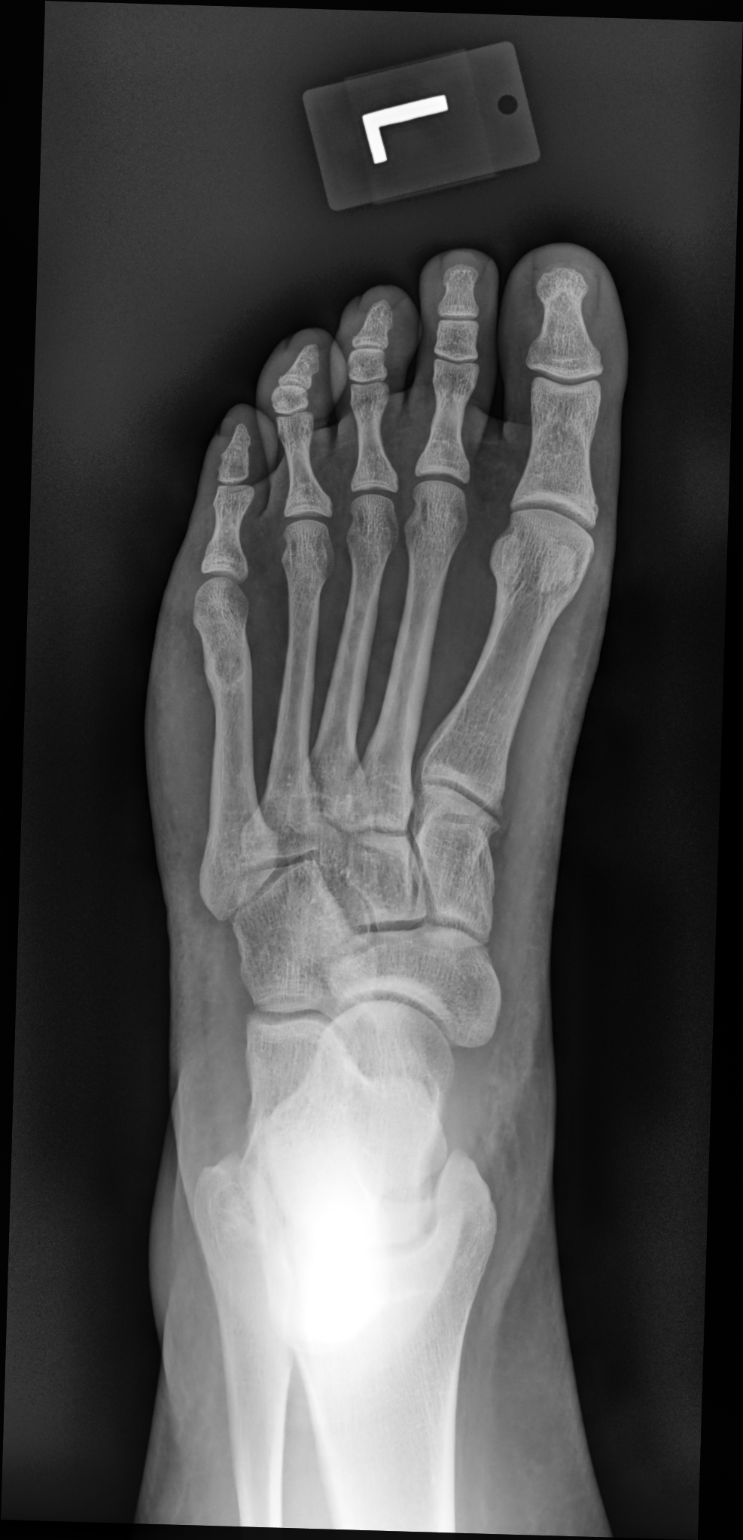

[foot medial oblique]
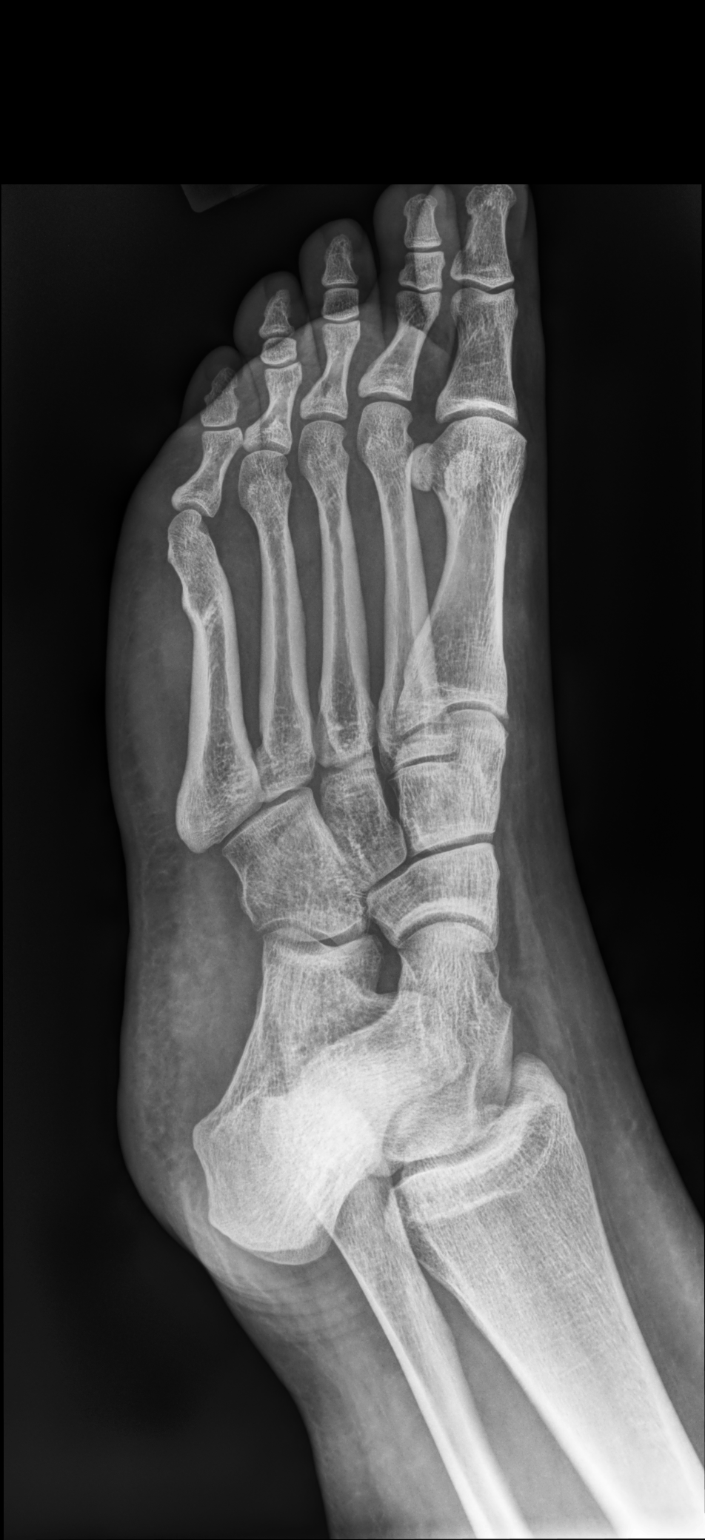

[foot supine lat]
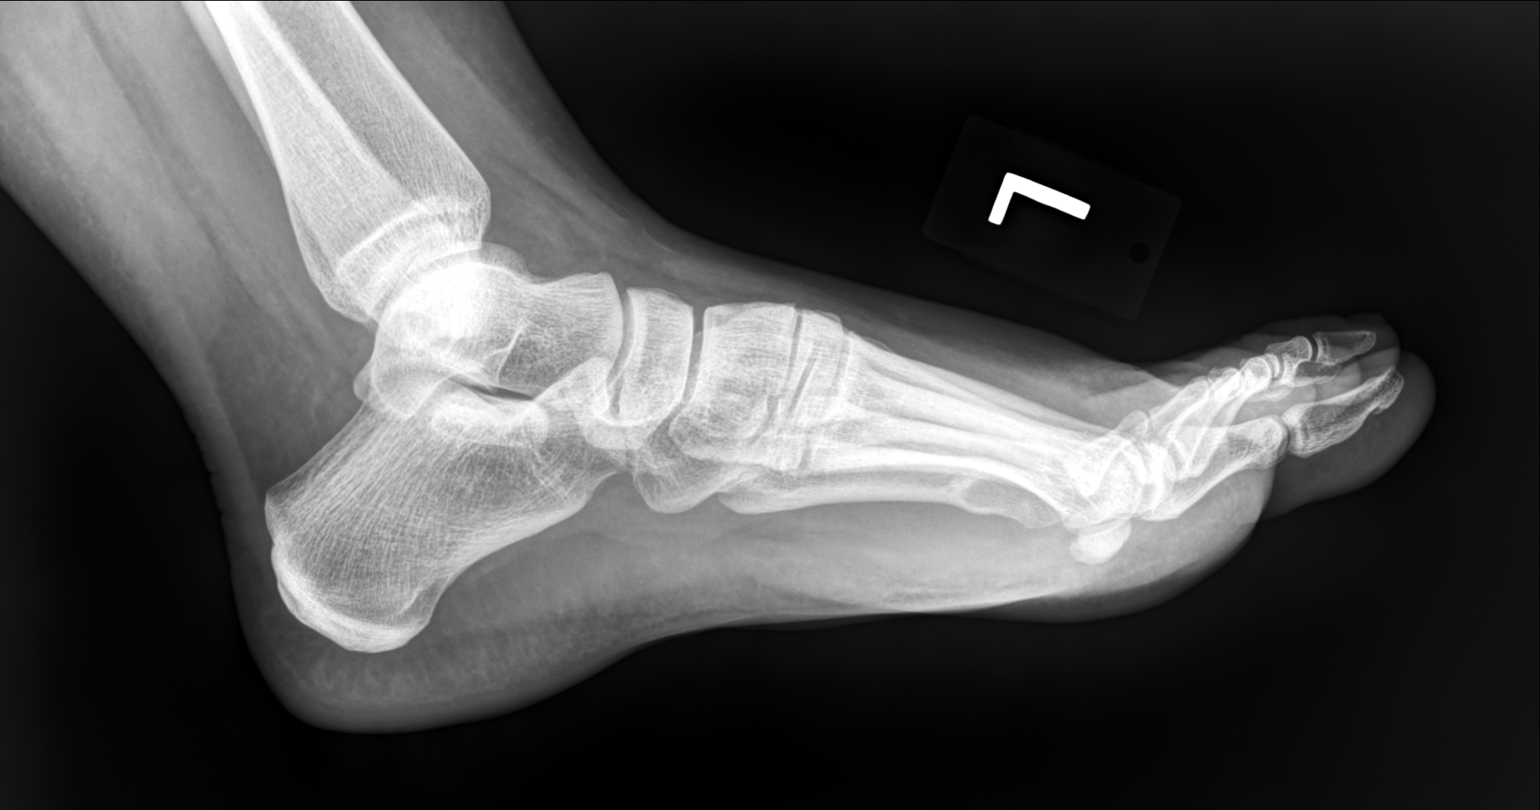

[3 of 3 positions shown; findings below may reference images not displayed]

FINDINGS: No acute fracture or dislocation. Remotely healed fracture of the
distal shaft of the left fifth metatarsal noted. Joint spaces well
maintained without evidence for degenerative or erosive arthropathy.
Osseous mineralization normal. No visible soft tissue abnormality.
IMPRESSION: 1. No acute osseous abnormality about the left foot.
2. Remotely healed fracture of the left fifth metatarsal.

## 2021-05-04 NOTE — ED Triage Notes (Signed)
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  Patient has pain in top of foot and ankle.  Pain with pressure and with movement.  Patient has taken ibuprofen and no relief.  Patient has also used an ointment for pain and has had some relief.  Pain is starting to radiate up left lower, lateral leg ?

## 2021-05-04 NOTE — ED Provider Notes (Signed)
?[facility] ? ? ? ?CSN: [id number] ?Arrival date & time: 05/04/21  1812 ? ? ?  ? ?History   ?Chief Complaint ?Chief Complaint  ?Patient presents with  ? Foot Pain  ? ? ?HPI ?Christy Bell is a 31 y.o. female.  ? ?Patient presents with left foot pain and left ankle pain that started a few days prior.  Patient denies any apparent injury but is not sure if she injured it at work as she started a new job recently and pain started with this new job.  Patient is taking ibuprofen with minimal improvement.  Denies any numbness or tingling.  Movement and bearing weight exacerbates pain.  Patient was seen in September 2022 and had x-ray completed that showed an old fracture that she was not aware of. ? ? ?Foot Pain ? ? ?Past Medical History:  ?Diagnosis Date  ? Anxiety   ? Asthma   ? Bronchitis   ? Depression   ? Fatty liver   ? GERD (gastroesophageal reflux disease)   ? Gestational diabetes   ? Headache   ? Hx of chlamydia infection   ? Preeclampsia   ? Resolved after childbirth  ? ? ?Patient Active Problem List  ? Diagnosis Date Noted  ? Status post repeat low transverse cesarean section 02/26/2017  ? ? ?Past Surgical History:  ?Procedure Laterality Date  ? CESAREAN SECTION N/A 09/07/2014  ? Procedure: CESAREAN SECTION;  Surgeon: Essie HartWalda Pinn, MD;  Location: [facility];  Service: Obstetrics;  Laterality: N/A;  ? CESAREAN SECTION N/A 02/26/2017  ? Procedure: REPEAT CESAREAN SECTION;  Surgeon: Essie HartPinn, Walda, MD;  [facility];  Service: Obstetrics;  Laterality: N/A;  Tracey RNFA  ? CHOLECYSTECTOMY N/A 10/29/2019  ? Procedure: LAPAROSCOPIC CHOLECYSTECTOMY;  Surgeon: Almond LintByerly, Faera, MD;  Location: [facility];  Service: General;  Laterality: N/A;  ? GALLBLADDER SURGERY    ? WISDOM TOOTH EXTRACTION    ? x1  ? ? ?OB History   ? ? Gravida  ?2  ? Para  ?1  ? Term  ?   ? Preterm  ?1  ? AB  ?   ? Living  ?1  ?  ? ? SAB  ?   ? IAB  ?   ? Ectopic  ?   ? Multiple  ?0  ? Live Births  ?1  ?   ?  ?  ? ? ? ?Home Medications    ? ?Prior to Admission medications   ?Medication Sig Start Date End Date Taking? Authorizing Provider  ?acetaminophen (TYLENOL) 500 MG tablet Take 500 mg by mouth every 6 (six) hours as needed for moderate pain.    [provider]  ?albuterol (VENTOLIN HFA) 108 (90 Base) MCG/ACT inhaler Inhale 1-2 puffs into the lungs every 4 (four) hours as needed for wheezing or shortness of breath. 08/31/19   [provider]  ?benzonatate (TESSALON) 100 MG capsule Take 1 capsule (100 mg total) by mouth every 8 (eight) hours as needed for cough. ?Patient not taking: Reported on 05/04/2021 03/22/21   Gustavus BryantMound, Adriene Knipfer E, FNP  ?cetirizine (ZYRTEC) 10 MG tablet Take 10 mg by mouth daily as needed for allergies. 08/31/19   [provider]  ?hydrOXYzine (ATARAX/VISTARIL) 10 MG tablet Take 10 mg by mouth at bedtime as needed for anxiety. 09/23/19   [provider]  ?ibuprofen (ADVIL) 200 MG tablet Take 200 mg by mouth every 6 (six) hours as needed for moderate pain.    [provider]  ?  naproxen (NAPROSYN) 500 MG tablet Take 1 tablet (500 mg total) by mouth 2 (two) times daily. ?Patient not taking: Reported on 05/04/2021 10/26/20   Wallis Bamberg, PA-C  ?sertraline (ZOLOFT) 50 MG tablet Take 50 mg by mouth daily.    [provider]  ?SYMBICORT 160-4.5 MCG/ACT inhaler Inhale 2 puffs into the lungs daily as needed (sob/wheezing).  09/21/19   [provider]  ? ? ?Family History ?Family History  ?Problem Relation Age of Onset  ? Healthy Mother   ? Mental illness Father   ? Hypertension Maternal Grandmother   ? Heart disease Maternal Grandmother   ? Mental illness Maternal Grandmother   ? Diabetes Paternal Grandmother   ? ? ?Social History ?Social History  ? ?Tobacco Use  ? Smoking status: Former  ?  Types: Cigarettes  ?  Quit date: 02/07/2014  ?  Years since quitting: 7.2  ? Smokeless tobacco: Never  ?Vaping Use  ? Vaping Use: Never used  ?Substance Use Topics  ? Alcohol use: No  ?   Alcohol/week: 0.0 standard drinks  ?  Comment: Social  ? Drug use: Yes  ?  Types: Marijuana  ? ? ? ?Allergies   ?Patient has no known allergies. ? ? ?Review of Systems ?Review of Systems ?Per HPI ? ?Physical Exam ?Triage Vital Signs ?ED Triage Vitals  ?Enc Vitals Group  ?   BP 05/04/21 1954 109/74  ?   Pulse Rate 05/04/21 1954 78  ?   Resp 05/04/21 1954 20  ?   Temp 05/04/21 1954 97.9 ?F (36.6 ?C)  ?   Temp Source 05/04/21 1954 Oral  ?   SpO2 05/04/21 1954 98 %  ?   Weight --   ?   Height --   ?   Head Circumference --   ?   Peak Flow --   ?   Pain Score 05/04/21 1957 9  ?   Pain Loc --   ?   Pain Edu? --   ?   Excl. in GC? --   ? ?No data found. ? ?Updated Vital Signs ?BP 109/74 (BP Location: Left Arm)   Pulse 78   Temp 97.9 ?F (36.6 ?C) (Oral)   Resp 20   SpO2 98%  ? ?Visual Acuity ?Right Eye Distance:   ?Left Eye Distance:   ?Bilateral Distance:   ? ?Right Eye Near:   ?Left Eye Near:    ?Bilateral Near:    ? ?Physical Exam ?Constitutional:   ?   General: She is not in acute distress. ?   Appearance: Normal appearance. She is not toxic-appearing or diaphoretic.  ?HENT:  ?   Head: Normocephalic and atraumatic.  ?Eyes:  ?   Extraocular Movements: Extraocular movements intact.  ?   Conjunctiva/sclera: Conjunctivae normal.  ?Pulmonary:  ?   Effort: Pulmonary effort is normal.  ?Musculoskeletal:  ?   Comments: Tenderness to palpation to dorsal surface of left foot overlying shaft of fifth metatarsal.  Tenderness to palpation generalized throughout left ankle.  Full range of motion of ankle is present.  Full range of motion of foot and toes are present as well.  Mild swelling noted to left dorsal surface of foot at fifth metatarsal.  Neurovascular intact.  ?Neurological:  ?   General: No focal deficit present.  ?   Mental Status: She is alert and oriented to person, place, and time. Mental status is at baseline.  ?Psychiatric:     ?   Mood and Affect:  Mood normal.     ?   Behavior: Behavior normal.     ?   Thought  Content: Thought content normal.     ?   Judgment: Judgment normal.  ? ? ? ?UC Treatments / Results  ?Labs ?(all labs ordered are listed, but only abnormal results are displayed) ?Labs Reviewed - No data to display ? ?EKG ? ? ?Radiology ?DG Ankle Complete Left ? ?Result Date: 05/04/2021 ?CLINICAL DATA:  Initial evaluation for acute pain at top of foot and ankle. EXAM: LEFT ANKLE COMPLETE - 3+ VIEW COMPARISON:  None. FINDINGS: No acute fracture dislocation. Ankle mortise approximated. Talar dome intact. No visible joint effusion. No significant osteoarthritic changes about the ankle. Visualized soft tissues within normal limits. IMPRESSION: Normal radiograph of the left ankle. No acute osseous abnormality or findings to explain patient's symptoms. Electronically Signed   By: Rise Mu M.D.   On: 05/04/2021 20:32  ? ?DG Foot Complete Left ? ?Result Date: 05/04/2021 ?CLINICAL DATA:  Initial evaluation for acute pain at top of foot and ankle. EXAM: LEFT FOOT - COMPLETE 3+ VIEW COMPARISON:  Prior radiograph from 10/26/2020. FINDINGS: No acute fracture or dislocation. Remotely healed fracture of the distal shaft of the left fifth metatarsal noted. Joint spaces well maintained without evidence for degenerative or erosive arthropathy. Osseous mineralization normal. No visible soft tissue abnormality. IMPRESSION: 1. No acute osseous abnormality about the left foot. 2. Remotely healed fracture of the left fifth metatarsal. Electronically Signed   By: Rise Mu M.D.   On: 05/04/2021 20:34   ? ?Procedures ?Procedures (including critical care time) ? ?Medications Ordered in UC ?Medications - No data to display ? ?Initial Impression / Assessment and Plan / UC Course  ?I have reviewed the triage vital signs and the nursing notes. ? ?Pertinent labs & imaging results that were available during my care of the patient were reviewed by me and considered in my medical decision making (see chart for details). ? ?   ? ?Left ankle x-ray was negative for any acute bony abnormality.  Left foot x-ray showing same remote healing fracture noted on last x-ray.  No new acute bony abnormality.  Suspect the patient's old injury could be exac

## 2021-05-04 NOTE — Discharge Instructions (Signed)
Follow-up with orthopedist for further evaluation and management. 

## 2021-06-08 ENCOUNTER — Ambulatory Visit
Admission: EM | Admit: 2021-06-08 | Discharge: 2021-06-08 | Disposition: A | Discharge status: Home / Self Care | Payer: Medicaid Other | Attending: Physician Assistant | Admitting: Physician Assistant

## 2021-06-08 ENCOUNTER — Encounter: Payer: Self-pay | Admitting: Emergency Medicine

## 2021-06-08 DIAGNOSIS — R112 Nausea with vomiting, unspecified: Secondary | ICD-10-CM

## 2021-06-08 DIAGNOSIS — R1084 Generalized abdominal pain: Secondary | ICD-10-CM

## 2021-06-08 DIAGNOSIS — A084 Viral intestinal infection, unspecified: Secondary | ICD-10-CM | POA: Diagnosis not present

## 2021-06-08 MED ORDER — ONDANSETRON HCL 4 MG PO TABS: 4.0000 mg | ORAL_TABLET | Freq: Four times a day (QID) | ORAL | 0 refills | Status: DC

## 2021-06-08 NOTE — ED Provider Notes (Signed)
?[facility] ? ? ? ?CSN: [id number] ?Arrival date & time: 06/08/21  0931 ? ? ?  ? ?History   ?Chief Complaint ?Chief Complaint  ?Patient presents with  ? Abdominal Pain  ? Emesis  ? ? ?HPI ?Christy Bell is a 31 y.o. female.  ? ?31 year old female presents with nausea, vomiting, and diarrhea.  Patient relates that she was family member Saturday stomach problems.  Patient relates on Sunday which was 5 days ago she started having abdominal pain, cramping, bloating, and then started noticing it.  Patient relates on Monday she started having cervical episodes of abdominal pain.  Patient indicates that at the same time she started having some loose bowels and watery diarrhea.  Patient indicates that the symptoms resolved Tuesday and Wednesday.  Patient indicates she started eating some fried foods Wednesday evening.  And then Thursday she started having recurrence of abdominal pain, nausea, several episodes of vomiting, and loose watery bowels.  Patient is currently here today for evaluation of her symptoms and also for work note.  Patient without fever, or chills.  Patient denies sinus congestion, and sore throat. ? ? ?Abdominal Pain ?Associated symptoms: diarrhea, nausea and vomiting   ?Emesis ?Associated symptoms: abdominal pain and diarrhea   ? ?Past Medical History:  ?Diagnosis Date  ? Anxiety   ? Asthma   ? Bronchitis   ? Depression   ? Fatty liver   ? GERD (gastroesophageal reflux disease)   ? Gestational diabetes   ? Headache   ? Hx of chlamydia infection   ? Preeclampsia   ? Resolved after childbirth  ? ? ?Patient Active Problem List  ? Diagnosis Date Noted  ? Status post repeat low transverse cesarean section 02/26/2017  ? ? ?Past Surgical History:  ?Procedure Laterality Date  ? CESAREAN SECTION N/A 09/07/2014  ? Procedure: CESAREAN SECTION;  Surgeon: Essie Hart, MD;  Location: [facility];  Service: Obstetrics;  Laterality: N/A;  ? CESAREAN SECTION N/A 02/26/2017  ? Procedure: REPEAT CESAREAN SECTION;   Surgeon: Essie Hart, MD;  Location: [facility];  Service: Obstetrics;  Laterality: N/A;  Tracey RNFA  ? CHOLECYSTECTOMY N/A 10/29/2019  ? Procedure: LAPAROSCOPIC CHOLECYSTECTOMY;  Surgeon: Almond Lint, MD;  Location: [facility];  Service: General;  Laterality: N/A;  ? GALLBLADDER SURGERY    ? WISDOM TOOTH EXTRACTION    ? x1  ? ? ?OB History   ? ? Gravida  ?2  ? Para  ?1  ? Term  ?   ? Preterm  ?1  ? AB  ?   ? Living  ?1  ?  ? ? SAB  ?   ? IAB  ?   ? Ectopic  ?   ? Multiple  ?0  ? Live Births  ?1  ?   ?  ?  ? ? ? ?Home Medications   ? ?Prior to Admission medications   ?Medication Sig Start Date End Date Taking? Authorizing Provider  ?ondansetron (ZOFRAN) 4 MG tablet Take 1 tablet (4 mg total) by mouth every 6 (six) hours. 06/08/21  Yes Ellsworth Lennox, PA-C  ?acetaminophen (TYLENOL) 500 MG tablet Take 500 mg by mouth every 6 (six) hours as needed for moderate pain.    [provider]  ?albuterol (VENTOLIN HFA) 108 (90 Base) MCG/ACT inhaler Inhale 1-2 puffs into the lungs every 4 (four) hours as needed for wheezing or shortness of breath. 08/31/19   [provider]  ?benzonatate (TESSALON) 100 MG capsule Take 1 capsule (100  mg total) by mouth every 8 (eight) hours as needed for cough. ?Patient not taking: Reported on 05/04/2021 03/22/21   Gustavus Bryant, FNP  ?cetirizine (ZYRTEC) 10 MG tablet Take 10 mg by mouth daily as needed for allergies. 08/31/19   [provider]  ?hydrOXYzine (ATARAX/VISTARIL) 10 MG tablet Take 10 mg by mouth at bedtime as needed for anxiety. 09/23/19   [provider]  ?ibuprofen (ADVIL) 200 MG tablet Take 200 mg by mouth every 6 (six) hours as needed for moderate pain.    [provider]  ?naproxen (NAPROSYN) 500 MG tablet Take 1 tablet (500 mg total) by mouth 2 (two) times daily. ?Patient not taking: Reported on 05/04/2021 10/26/20   Wallis Bamberg, PA-C  ?sertraline (ZOLOFT) 50 MG tablet Take 50 mg by mouth daily.    [provider]  ?SYMBICORT  160-4.5 MCG/ACT inhaler Inhale 2 puffs into the lungs daily as needed (sob/wheezing).  09/21/19   [provider]  ? ? ?Family History ?Family History  ?Problem Relation Age of Onset  ? Healthy Mother   ? Mental illness Father   ? Hypertension Maternal Grandmother   ? Heart disease Maternal Grandmother   ? Mental illness Maternal Grandmother   ? Diabetes Paternal Grandmother   ? ? ?Social History ?Social History  ? ?Tobacco Use  ? Smoking status: Former  ?  Types: Cigarettes  ?  Quit date: 02/07/2014  ?  Years since quitting: 7.3  ? Smokeless tobacco: Never  ?Vaping Use  ? Vaping Use: Never used  ?Substance Use Topics  ? Alcohol use: No  ?  Alcohol/week: 0.0 standard drinks  ?  Comment: Social  ? Drug use: Yes  ?  Types: Marijuana  ? ? ? ?Allergies   ?Patient has no known allergies. ? ? ?Review of Systems ?Review of Systems  ?Gastrointestinal:  Positive for abdominal pain, diarrhea, nausea and vomiting.  ? ? ?Physical Exam ?Triage Vital Signs ?ED Triage Vitals  ?Enc Vitals Group  ?   BP 06/08/21 0955 123/85  ?   Pulse Rate 06/08/21 0955 89  ?   Resp 06/08/21 0955 18  ?   Temp 06/08/21 0955 98.4 ?F (36.9 ?C)  ?   Temp src --   ?   SpO2 06/08/21 0955 97 %  ?   Weight --   ?   Height --   ?   Head Circumference --   ?   Peak Flow --   ?   Pain Score 06/08/21 0952 7  ?   Pain Loc --   ?   Pain Edu? --   ?   Excl. in GC? --   ? ?No data found. ? ?Updated Vital Signs ?BP 123/85   Pulse 89   Temp 98.4 ?F (36.9 ?C)   Resp 18   SpO2 97%   Breastfeeding No  ? ?Visual Acuity ?Right Eye Distance:   ?Left Eye Distance:   ?Bilateral Distance:   ? ?Right Eye Near:   ?Left Eye Near:    ?Bilateral Near:    ? ?Physical Exam ?HENT:  ?   Right Ear: Tympanic membrane and ear canal normal.  ?   Left Ear: Tympanic membrane and ear canal normal.  ?   Mouth/Throat:  ?   Mouth: Mucous membranes are moist.  ?   Pharynx: Oropharynx is clear.  ?Neck:  ?   Comments: Neck: No evidence of cervical lymphadenopathy  bilaterally. ?Cardiovascular:  ?   Rate and  Rhythm: Normal rate and regular rhythm.  ?   Comments: Heart: Regular rate and rhythm without murmurs, thrills. ?Pulmonary:  ?   Effort: Pulmonary effort is normal.  ?   Breath sounds: Normal breath sounds and air entry.  ?Abdominal:  ?   General: Abdomen is flat. Bowel sounds are normal.  ?   Palpations: Abdomen is soft.  ?   Tenderness: There is generalized abdominal tenderness.  ?   Comments: Abdomen: Normal bowel sounds all quadrants, no masses, no guarding or rebound.  ?Neurological:  ?   Mental Status: She is alert.  ? ? ? ?UC Treatments / Results  ?Labs ?(all labs ordered are listed, but only abnormal results are displayed) ?Labs Reviewed - No data to display ? ?EKG ? ? ?Radiology ?No results found. ? ?Procedures ?Procedures (including critical care time) ? ?Medications Ordered in UC ?Medications - No data to display ? ?Initial Impression / Assessment and Plan / UC Course  ?I have reviewed the triage vital signs and the nursing notes. ? ?Pertinent labs & imaging results that were available during my care of the patient were reviewed by me and considered in my medical decision making (see chart for details). ? ?Plan: ?Patient be given 48-hour rest work. ?Patient advised to continue treatment with clear liquids, bland diet, salty crackers, the next 48 hours. ?Patient advised to avoid spicy, greasy, or fried foods next 72 hours. ?Patient advised if symptoms fail to improve in the next 72 hours return for re-evaluation. ? ? ?Final Clinical Impressions(s) / UC Diagnoses  ? ?Final diagnoses:  ?Generalized abdominal pain  ?Nausea and vomiting, unspecified vomiting type  ?Viral gastroenteritis  ? ? ? ?Discharge Instructions   ? ?  ?Continue fluids - clear liquids, saltine crackers - clear broths next 24 hours. ?Bland diet next 2-3 days, avoid fried and greasy foods. ?Follow-up with PCP or return to UC if symptoms fail to improve. ? ? ? ?ED Prescriptions   ? ? Medication Sig  Dispense Auth. Provider  ? ondansetron (ZOFRAN) 4 MG tablet Take 1 tablet (4 mg total) by mouth every 6 (six) hours. 12 tablet Ellsworth LennoxJames, Kaisy Severino, PA-C  ? ?  ? ?PDMP not reviewed this encounter. ?  ?Ellsworth LennoxJames, Drina Jobst, PA-C ?06/08/21 1035

## 2021-06-08 NOTE — Discharge Instructions (Addendum)
Continue fluids - clear liquids, saltine crackers - clear broths next 24 hours. ?Bland diet next 2-3 days, avoid fried and greasy foods. ?Follow-up with PCP or return to UC if symptoms fail to improve. ?

## 2021-06-08 NOTE — ED Triage Notes (Signed)
Pt is present today with vomiting, abdominal pain, and slight diarrhea. Pt sx started sunday  ?

## 2021-07-18 ENCOUNTER — Ambulatory Visit
Admission: EM | Admit: 2021-07-18 | Discharge: 2021-07-18 | Disposition: A | Discharge status: Home / Self Care | Payer: Medicaid Other | Attending: Internal Medicine | Admitting: Internal Medicine

## 2021-07-18 ENCOUNTER — Encounter: Payer: Self-pay | Admitting: Emergency Medicine

## 2021-07-18 ENCOUNTER — Other Ambulatory Visit: Payer: Self-pay

## 2021-07-18 DIAGNOSIS — N611 Abscess of the breast and nipple: Secondary | ICD-10-CM | POA: Diagnosis not present

## 2021-07-18 MED ORDER — DOXYCYCLINE HYCLATE 100 MG PO CAPS: 100.0000 mg | ORAL_CAPSULE | Freq: Two times a day (BID) | ORAL | 0 refills | Status: DC

## 2021-07-18 NOTE — Discharge Instructions (Signed)
You have been prescribed an antibiotic for breast abscess.  Your information has been faxed over to breast imaging center to have further imaging completed.  Someone will reach out to you to schedule an appointment.  Also recommend that you follow-up with provided contact information for dermatology given that this is a chronic issue.  Please go to the ER if symptoms persist or worsen.

## 2021-07-18 NOTE — ED Provider Notes (Signed)
[facility]    CSN: [id number] Arrival date & time: 07/18/21  3903      History   Chief Complaint Chief Complaint  Patient presents with   Abscess    HPI Christy Bell is a 31 y.o. female.   Patient presents with abscess on underside of right breast that has been present for a few days.  Patient denies any drainage from the area.  Denies fever, body aches, chills.  Patient reports that she gets similar abscesses throughout armpits and groin as well.  She has never seen a dermatologist for this.  She also reports that she is having pain throughout her right breast since abscess started. Denies nipple discharge.    Abscess   Past Medical History:  Diagnosis Date   Anxiety    Asthma    Bronchitis    Depression    Fatty liver    GERD (gastroesophageal reflux disease)    Gestational diabetes    Headache    Hx of chlamydia infection    Preeclampsia    Resolved after childbirth    Patient Active Problem List   Diagnosis Date Noted   Status post repeat low transverse cesarean section 02/26/2017    Past Surgical History:  Procedure Laterality Date   CESAREAN SECTION N/A 09/07/2014   Procedure: CESAREAN SECTION;  Surgeon: Essie Hart, MD;  Location: [facility];  Service: Obstetrics;  Laterality: N/A;   CESAREAN SECTION N/A 02/26/2017   Procedure: REPEAT CESAREAN SECTION;  Surgeon: Essie Hart, MD;  Location: [facility];  Service: Obstetrics;  Laterality: N/A;  Tracey RNFA   CHOLECYSTECTOMY N/A 10/29/2019   Procedure: LAPAROSCOPIC CHOLECYSTECTOMY;  Surgeon: Almond Lint, MD;  Location: [facility];  Service: General;  Laterality: N/A;   GALLBLADDER SURGERY     WISDOM TOOTH EXTRACTION     x1    OB History     Gravida  2   Para  1   Term      Preterm  1   AB      Living  1      SAB      IAB      Ectopic      Multiple  0   Live Births  1            Home Medications    Prior to Admission medications   Medication Sig Start  Date End Date Taking? Authorizing Provider  doxycycline (VIBRAMYCIN) 100 MG capsule Take 1 capsule (100 mg total) by mouth 2 (two) times daily. 07/18/21  Yes Ezra Denne, Rolly Salter E, FNP  acetaminophen (TYLENOL) 500 MG tablet Take 500 mg by mouth every 6 (six) hours as needed for moderate pain.    [provider]  albuterol (VENTOLIN HFA) 108 (90 Base) MCG/ACT inhaler Inhale 1-2 puffs into the lungs every 4 (four) hours as needed for wheezing or shortness of breath. 08/31/19   [provider]  benzonatate (TESSALON) 100 MG capsule Take 1 capsule (100 mg total) by mouth every 8 (eight) hours as needed for cough. Patient not taking: Reported on 05/04/2021 03/22/21   Gustavus Bryant, FNP  cetirizine (ZYRTEC) 10 MG tablet Take 10 mg by mouth daily as needed for allergies. 08/31/19   [provider]  hydrOXYzine (ATARAX/VISTARIL) 10 MG tablet Take 10 mg by mouth at bedtime as needed for anxiety. 09/23/19   [provider]  ibuprofen (ADVIL) 200 MG tablet Take 200 mg by mouth every 6 (six) hours as needed for  moderate pain.    [provider]  naproxen (NAPROSYN) 500 MG tablet Take 1 tablet (500 mg total) by mouth 2 (two) times daily. Patient not taking: Reported on 05/04/2021 10/26/20   Wallis BambergMani, Mario, PA-C  ondansetron (ZOFRAN) 4 MG tablet Take 1 tablet (4 mg total) by mouth every 6 (six) hours. 06/08/21   Ellsworth LennoxJames, David, PA-C  sertraline (ZOLOFT) 50 MG tablet Take 50 mg by mouth daily.    [provider]  SYMBICORT 160-4.5 MCG/ACT inhaler Inhale 2 puffs into the lungs daily as needed (sob/wheezing).  09/21/19   [provider]    Family History Family History  Problem Relation Age of Onset   Healthy Mother    Mental illness Father    Hypertension Maternal Grandmother    Heart disease Maternal Grandmother    Mental illness Maternal Grandmother    Diabetes Paternal Grandmother     Social History Social History   Tobacco Use   Smoking status: Former     Types: Cigarettes    Quit date: 02/07/2014    Years since quitting: 7.4   Smokeless tobacco: Never  Vaping Use   Vaping Use: Never used  Substance Use Topics   Alcohol use: No    Alcohol/week: 0.0 standard drinks of alcohol    Comment: Social   Drug use: Yes    Types: Marijuana     Allergies   Patient has no known allergies.   Review of Systems Review of Systems Per HPI  Physical Exam Triage Vital Signs ED Triage Vitals [07/18/21 0842]  Enc Vitals Group     BP 124/79     Pulse Rate 76     Resp 18     Temp 98.6 F (37 C)     Temp Source Oral     SpO2 98 %     Weight      Height      Head Circumference      Peak Flow      Pain Score 7     Pain Loc      Pain Edu?      Excl. in GC?    No data found.  Updated Vital Signs BP 124/79 (BP Location: Left Arm)   Pulse 76   Temp 98.6 F (37 C) (Oral)   Resp 18   SpO2 98%   Visual Acuity Right Eye Distance:   Left Eye Distance:   Bilateral Distance:    Right Eye Near:   Left Eye Near:    Bilateral Near:     Physical Exam Exam conducted with a chaperone present.  Constitutional:      General: She is not in acute distress.    Appearance: Normal appearance. She is not toxic-appearing or diaphoretic.  HENT:     Head: Normocephalic and atraumatic.  Eyes:     Extraocular Movements: Extraocular movements intact.     Conjunctiva/sclera: Conjunctivae normal.  Pulmonary:     Effort: Pulmonary effort is normal.  Chest:       Comments: Approximately 2.5 cm indurated abscess present on mid underside to right breast.  No drainage noted.  It is erythematous.  No induration surrounding.  Nipple is normal.  No other masses or lesions noted to the breast. Neurological:     General: No focal deficit present.     Mental Status: She is alert and oriented to person, place, and time. Mental status is at baseline.  Psychiatric:        Mood  and Affect: Mood normal.        Behavior: Behavior normal.        Thought Content:  Thought content normal.        Judgment: Judgment normal.      UC Treatments / Results  Labs (all labs ordered are listed, but only abnormal results are displayed) Labs Reviewed - No data to display  EKG   Radiology No results found.  Procedures Procedures (including critical care time)  Medications Ordered in UC Medications - No data to display  Initial Impression / Assessment and Plan / UC Course  I have reviewed the triage vital signs and the nursing notes.  Pertinent labs & imaging results that were available during my care of the patient were reviewed by me and considered in my medical decision making (see chart for details).     Abscess to right breast.  Will treat with doxycycline antibiotic.  Advised patient of warm compresses as well.  I&D not indicated at this time.  Although, patient was given strict return and ER precautions if abscess does not improve or if it worsens  Due to abscess to breast and patient reporting that she has pain throughout her breast, I do think that imaging of the breast is warranted.  Patient's information was faxed over to breast imaging center in [place].  Someone will reach out to patient to schedule appointment for imaging and patient was advised of this.  Patient verbalized understanding and was agreeable with plan. Final Clinical Impressions(s) / UC Diagnoses   Final diagnoses:  Breast abscess     Discharge Instructions      You have been prescribed an antibiotic for breast abscess.  Your information has been faxed over to breast imaging center to have further imaging completed.  Someone will reach out to you to schedule an appointment.  Also recommend that you follow-up with provided contact information for dermatology given that this is a chronic issue.  Please go to the ER if symptoms persist or worsen.    ED Prescriptions     Medication Sig Dispense Auth. Provider   doxycycline (VIBRAMYCIN) 100 MG capsule Take 1 capsule  (100 mg total) by mouth 2 (two) times daily. 20 capsule Gustavus Bryant, [place]      PDMP not reviewed this encounter.   Gustavus Bryant, [place] 07/18/21 [phone]

## 2021-07-18 NOTE — ED Triage Notes (Signed)
Pt here for possible abscess under right breast with pain and swelling; pt sts hx of similar in past; denies drainage at present

## 2021-07-21 ENCOUNTER — Other Ambulatory Visit: Payer: Self-pay | Admitting: Internal Medicine

## 2021-07-21 DIAGNOSIS — N644 Mastodynia: Secondary | ICD-10-CM

## 2021-08-10 ENCOUNTER — Ambulatory Visit
Admission: EM | Admit: 2021-08-10 | Discharge: 2021-08-10 | Disposition: A | Discharge status: Home / Self Care | Payer: Medicaid Other

## 2021-08-10 DIAGNOSIS — B084 Enteroviral vesicular stomatitis with exanthem: Secondary | ICD-10-CM

## 2021-08-10 NOTE — Discharge Instructions (Signed)
I provided you with information about hand-foot-and-mouth disease that I hope you find useful.  I also provided you with a note explaining your diagnosis and CDC requirements.  There is no particular treatment for hand-foot-and-mouth other than symptomatic treatment.  You are certainly welcome to try taking Imodium for diarrhea, ibuprofen for discomfort and body aches.  Thank you for visiting urgent care today.

## 2021-08-10 NOTE — ED Provider Notes (Signed)
[facility]    CSN: [id number] Arrival date & time: 08/10/21  1147    HISTORY   Chief Complaint  Patient presents with   Rash   HPI Christy Bell is a 31 y.o. female. Patient presents to urgent care today stating that both of her daughters have blisterlike lesions scattered on their hands feet and around mouths and that one of her daughters was diagnosed with hand-foot-and-mouth disease.  Patient states that she herself has developed similar lesions on the backs of her hands and forearms as well as the tops of her feet.  Patient states lesions do not itch but are little tender to touch.  The history is provided by the patient.   Past Medical History:  Diagnosis Date   Anxiety    Asthma    Bronchitis    Depression    Fatty liver    GERD (gastroesophageal reflux disease)    Gestational diabetes    Headache    Hx of chlamydia infection    Preeclampsia    Resolved after childbirth   Patient Active Problem List   Diagnosis Date Noted   Status post repeat low transverse cesarean section 02/26/2017   Past Surgical History:  Procedure Laterality Date   CESAREAN SECTION N/A 09/07/2014   Procedure: CESAREAN SECTION;  Surgeon: Essie Hart, MD;  Location: [facility];  Service: Obstetrics;  Laterality: N/A;   CESAREAN SECTION N/A 02/26/2017   Procedure: REPEAT CESAREAN SECTION;  Surgeon: Essie Hart, MD;  Location: [facility];  Service: Obstetrics;  Laterality: N/A;  Tracey RNFA   CHOLECYSTECTOMY N/A 10/29/2019   Procedure: LAPAROSCOPIC CHOLECYSTECTOMY;  Surgeon: Almond Lint, MD;  Location: [facility];  Service: General;  Laterality: N/A;   GALLBLADDER SURGERY     WISDOM TOOTH EXTRACTION     x1   OB History     Gravida  2   Para  1   Term      Preterm  1   AB      Living  1      SAB      IAB      Ectopic      Multiple  0   Live Births  1          Home Medications    Prior to Admission medications   Medication Sig Start Date End Date  Taking? Authorizing Provider  acetaminophen (TYLENOL) 500 MG tablet Take 500 mg by mouth every 6 (six) hours as needed for moderate pain.    [provider]  albuterol (VENTOLIN HFA) 108 (90 Base) MCG/ACT inhaler Inhale 1-2 puffs into the lungs every 4 (four) hours as needed for wheezing or shortness of breath. 08/31/19   [provider]  cetirizine (ZYRTEC) 10 MG tablet Take 10 mg by mouth daily as needed for allergies. 08/31/19   [provider]  hydrOXYzine (ATARAX/VISTARIL) 10 MG tablet Take 10 mg by mouth at bedtime as needed for anxiety. 09/23/19   [provider]  ibuprofen (ADVIL) 200 MG tablet Take 200 mg by mouth every 6 (six) hours as needed for moderate pain.    [provider]  ondansetron (ZOFRAN) 4 MG tablet Take 1 tablet (4 mg total) by mouth every 6 (six) hours. 06/08/21   Ellsworth Lennox, PA-C  sertraline (ZOLOFT) 50 MG tablet Take 100 mg by mouth daily.    [provider]  SYMBICORT 160-4.5 MCG/ACT inhaler Inhale 2 puffs into the lungs daily as needed (sob/wheezing).  09/21/19  [provider]    Family History Family History  Problem Relation Age of Onset   Healthy Mother    Mental illness Father    Hypertension Maternal Grandmother    Heart disease Maternal Grandmother    Mental illness Maternal Grandmother    Diabetes Paternal Grandmother    Social History Social History   Tobacco Use   Smoking status: Former    Types: Cigarettes    Quit date: 02/07/2014    Years since quitting: 7.5   Smokeless tobacco: Never  Vaping Use   Vaping Use: Never used  Substance Use Topics   Alcohol use: No    Alcohol/week: 0.0 standard drinks of alcohol    Comment: Social   Drug use: Yes    Types: Marijuana   Allergies   Patient has no known allergies.  Review of Systems Review of Systems Pertinent findings noted in history of present illness.   Physical Exam Triage Vital Signs ED Triage Vitals  Enc Vitals Group      BP 12/02/20 0827 (!) 147/82     Pulse Rate 12/02/20 0827 72     Resp 12/02/20 0827 18     Temp 12/02/20 0827 98.3 F (36.8 C)     Temp Source 12/02/20 0827 Oral     SpO2 12/02/20 0827 98 %     Weight --      Height --      Head Circumference --      Peak Flow --      Pain Score 12/02/20 0826 5     Pain Loc --      Pain Edu? --      Excl. in GC? --   No data found.  Updated Vital Signs BP 107/77 (BP Location: Right Arm)   Pulse 82   Temp 99.7 F (37.6 C) (Oral)   Resp 18   SpO2 98%   Physical Exam Vitals and nursing note reviewed.  Constitutional:      General: She is not in acute distress.    Appearance: Normal appearance. She is not ill-appearing.  HENT:     Head: Normocephalic and atraumatic.     Salivary Glands: Right salivary gland is not diffusely enlarged or tender. Left salivary gland is not diffusely enlarged or tender.     Right Ear: Tympanic membrane, ear canal and external ear normal. No drainage. No middle ear effusion. There is no impacted cerumen. Tympanic membrane is not erythematous or bulging.     Left Ear: Tympanic membrane, ear canal and external ear normal. No drainage.  No middle ear effusion. There is no impacted cerumen. Tympanic membrane is not erythematous or bulging.     Nose: Nose normal. No nasal deformity, septal deviation, mucosal edema, congestion or rhinorrhea.     Right Turbinates: Not enlarged, swollen or pale.     Left Turbinates: Not enlarged, swollen or pale.     Right Sinus: No maxillary sinus tenderness or frontal sinus tenderness.     Left Sinus: No maxillary sinus tenderness or frontal sinus tenderness.     Mouth/Throat:     Lips: Pink. No lesions.     Mouth: Mucous membranes are moist. No oral lesions.     Pharynx: Oropharynx is clear. Uvula midline. No posterior oropharyngeal erythema or uvula swelling.     Tonsils: No tonsillar exudate. 0 on the right. 0 on the left.  Eyes:     General: Lids are normal.  Right eye: No  discharge.        Left eye: No discharge.     Extraocular Movements: Extraocular movements intact.     Conjunctiva/sclera: Conjunctivae normal.     Right eye: Right conjunctiva is not injected.     Left eye: Left conjunctiva is not injected.  Neck:     Trachea: Trachea and phonation normal.  Cardiovascular:     Rate and Rhythm: Normal rate and regular rhythm.     Pulses: Normal pulses.     Heart sounds: Normal heart sounds. No murmur heard.    No friction rub. No gallop.  Pulmonary:     Effort: Pulmonary effort is normal. No accessory muscle usage, prolonged expiration or respiratory distress.     Breath sounds: Normal breath sounds. No stridor, decreased air movement or transmitted upper airway sounds. No decreased breath sounds, wheezing, rhonchi or rales.  Chest:     Chest wall: No tenderness.  Musculoskeletal:        General: Normal range of motion.     Cervical back: Normal range of motion and neck supple. Normal range of motion.  Lymphadenopathy:     Cervical: No cervical adenopathy.  Skin:    General: Skin is warm and dry.     Findings: Lesion (Small lesions scattered across backs of hands and tops of feet possibly concerning for infection mouth particularly given exposure.) present. No erythema or rash.  Neurological:     General: No focal deficit present.     Mental Status: She is alert and oriented to person, place, and time.  Psychiatric:        Mood and Affect: Mood normal.        Behavior: Behavior normal.     Visual Acuity Right Eye Distance:   Left Eye Distance:   Bilateral Distance:    Right Eye Near:   Left Eye Near:    Bilateral Near:     UC Couse / Diagnostics / Procedures:    EKG  Radiology No results found.  Procedures Procedures (including critical care time)  UC Diagnoses / Final Clinical Impressions(s)   I have reviewed the triage vital signs and the nursing notes.  Pertinent labs & imaging results that were available during my care of  the patient were reviewed by me and considered in my medical decision making (see chart for details).    Final diagnoses:  Hand, foot and mouth disease (HFMD)   Patient states she is otherwise feeling well at this time.  Patient advised that she is contagious and if she could continue to working on home for the next 7 days per CDC guidelines it would be a great way for her to prevent spreading this virus to other people.  Return precautions advised.  ED Prescriptions   None    PDMP not reviewed this encounter.  Pending results:  Labs Reviewed - No data to display  Medications Ordered in UC: Medications - No data to display  Disposition Upon Discharge:  Condition: stable for discharge home Home: take medications as prescribed; routine discharge instructions as discussed; follow up as advised.  Patient presented with an acute illness with associated systemic symptoms and significant discomfort requiring urgent management. In my opinion, this is a condition that a prudent lay person (someone who possesses an average knowledge of health and medicine) may potentially expect to result in complications if not addressed urgently such as respiratory distress, impairment of bodily function or dysfunction of bodily organs.  Routine symptom specific, illness specific and/or disease specific instructions were discussed with the patient and/or caregiver at length.   As such, the patient has been evaluated and assessed, work-up was performed and treatment was provided in alignment with urgent care protocols and evidence based medicine.  Patient/parent/caregiver has been advised that the patient may require follow up for further testing and treatment if the symptoms continue in spite of treatment, as clinically indicated and appropriate.  Patient/parent/caregiver has been advised to return to the [facility] or PCP if no better; to PCP or the Emergency Department if new signs and symptoms develop, or if the  current signs or symptoms continue to change or worsen for further workup, evaluation and treatment as clinically indicated and appropriate  The patient will follow up with their current PCP if and as advised. If the patient does not currently have a PCP we will assist them in obtaining one.   The patient may need specialty follow up if the symptoms continue, in spite of conservative treatment and management, for further workup, evaluation, consultation and treatment as clinically indicated and appropriate.   Patient/parent/caregiver verbalized understanding and agreement of plan as discussed.  All questions were addressed during visit.  Please see discharge instructions below for further details of plan.  Discharge Instructions:   Discharge Instructions      I provided you with information about hand-foot-and-mouth disease that I hope you find useful.  I also provided you with a note explaining your diagnosis and CDC requirements.  There is no particular treatment for hand-foot-and-mouth other than symptomatic treatment.  You are certainly welcome to try taking Imodium for diarrhea, ibuprofen for discomfort and body aches.  Thank you for visiting urgent care today.    This office note has been dictated using Teaching laboratory technician.  Unfortunately, and despite my best efforts, this method of dictation can sometimes lead to occasional typographical or grammatical errors.  I apologize in advance if this occurs.     Theadora Rama Scales, PA-C 08/12/21 [phone]

## 2021-08-10 NOTE — ED Triage Notes (Signed)
The patients daughter has rashes and now the patient is c/o generalized rashes. She c/o some congestion.

## 2021-08-24 ENCOUNTER — Other Ambulatory Visit: Payer: Medicaid Other

## 2021-12-21 DIAGNOSIS — K760 Fatty (change of) liver, not elsewhere classified: Secondary | ICD-10-CM | POA: Insufficient documentation

## 2021-12-21 DIAGNOSIS — J45909 Unspecified asthma, uncomplicated: Secondary | ICD-10-CM | POA: Insufficient documentation

## 2021-12-21 DIAGNOSIS — K219 Gastro-esophageal reflux disease without esophagitis: Secondary | ICD-10-CM | POA: Insufficient documentation

## 2021-12-21 DIAGNOSIS — F418 Other specified anxiety disorders: Secondary | ICD-10-CM | POA: Insufficient documentation

## 2021-12-24 DIAGNOSIS — Z8619 Personal history of other infectious and parasitic diseases: Secondary | ICD-10-CM | POA: Insufficient documentation

## 2022-01-18 DIAGNOSIS — Z6836 Body mass index (BMI) 36.0-36.9, adult: Secondary | ICD-10-CM | POA: Insufficient documentation

## 2022-01-18 DIAGNOSIS — L732 Hidradenitis suppurativa: Secondary | ICD-10-CM | POA: Insufficient documentation

## 2022-06-02 DIAGNOSIS — S93402A Sprain of unspecified ligament of left ankle, initial encounter: Secondary | ICD-10-CM | POA: Insufficient documentation

## 2022-06-02 DIAGNOSIS — M25473 Effusion, unspecified ankle: Secondary | ICD-10-CM | POA: Insufficient documentation

## 2022-10-12 ENCOUNTER — Ambulatory Visit
Admission: EM | Admit: 2022-10-12 | Discharge: 2022-10-12 | Disposition: A | Discharge status: Home / Self Care | Payer: Medicaid Other | Attending: Physician Assistant | Admitting: Physician Assistant

## 2022-10-12 DIAGNOSIS — G8929 Other chronic pain: Secondary | ICD-10-CM

## 2022-10-12 DIAGNOSIS — M545 Low back pain, unspecified: Secondary | ICD-10-CM

## 2022-10-12 MED ORDER — PREDNISONE 20 MG PO TABS: 40.0000 mg | ORAL_TABLET | Freq: Every day | ORAL | 0 refills | Status: AC

## 2022-10-12 MED ORDER — TIZANIDINE HCL 4 MG PO TABS: 4.0000 mg | ORAL_TABLET | Freq: Four times a day (QID) | ORAL | 0 refills | Status: DC | PRN

## 2022-10-12 NOTE — ED Triage Notes (Signed)
Patient states that she's having back pain. Hx of back pain. Back pain for 5 years.

## 2022-10-12 NOTE — ED Provider Notes (Signed)
[facility]    CSN: [id number] Arrival date & time: 10/12/22  1151      History   Chief Complaint Chief Complaint  Patient presents with   Back Pain    HPI Christy Bell is a 32 y.o. female.   Patient here today for evaluation of back pain.  She states this is a chronic issue that she has had for several years but notes occasionally she will have worsening pain.  She notes that standing and position changes seem to worsen pain.  She states that pain is present mostly to the lower back but also some thoracic back bilaterally.  Pain does radiate down legs at times.  She does not report any numbness or tingling.  She does not report any new injury.  She notes she has tried everything with no relief  The history is provided by the patient.  Back Pain Associated symptoms: no abdominal pain, no fever and no numbness     Past Medical History:  Diagnosis Date   Anxiety    Asthma    Bronchitis    Depression    Fatty liver    GERD (gastroesophageal reflux disease)    Gestational diabetes    Headache    Hx of chlamydia infection    Preeclampsia    Resolved after childbirth    Patient Active Problem List   Diagnosis Date Noted   Status post repeat low transverse cesarean section 02/26/2017    Past Surgical History:  Procedure Laterality Date   CESAREAN SECTION N/A 09/07/2014   Procedure: CESAREAN SECTION;  Surgeon: Essie Hart, MD;  Location: [facility];  Service: Obstetrics;  Laterality: N/A;   CESAREAN SECTION N/A 02/26/2017   Procedure: REPEAT CESAREAN SECTION;  Surgeon: Essie Hart, MD;  Location: [facility]TH[facility] SU[facility];  Service: Obstetrics;  Laterality: N/A;  Tracey RNFA   CHOLECYSTECTOMY N/A 10/29/2019   Procedure: LAPAROSCOPIC CHOLECYSTECTOMY;  Surgeon: Almond Lint, MD;  Location: [facility];  Service: General;  Laterality: N/A;   GALLBLADDER SURGERY     WISDOM TOOTH EXTRACTION     x1    OB History     Gravida  2   Para  1   Term      Preterm  1    AB      Living  1      SAB      IAB      Ectopic      Multiple  0   Live Births  1            Home Medications    Prior to Admission medications   Medication Sig Start Date End Date Taking? Authorizing Provider  predniSONE (DELTASONE) 20 MG tablet Take 2 tablets (40 mg total) by mouth daily with breakfast for 5 days. 10/12/22 10/17/22 Yes Tomi Bamberger, PA-C  sertraline (ZOLOFT) 50 MG tablet Take 100 mg by mouth daily.   Yes [provider]  tiZANidine (ZANAFLEX) 4 MG tablet Take 1 tablet (4 mg total) by mouth every 6 (six) hours as needed for muscle spasms. 10/12/22  Yes Tomi Bamberger, PA-C  acetaminophen (TYLENOL) 500 MG tablet Take 500 mg by mouth every 6 (six) hours as needed for moderate pain.    [provider]  albuterol (VENTOLIN HFA) 108 (90 Base) MCG/ACT inhaler Inhale 1-2 puffs into the lungs every 4 (four) hours as needed for wheezing or shortness of breath. 08/31/19   [provider]  cetirizine (ZYRTEC) 10 MG  tablet Take 10 mg by mouth daily as needed for allergies. 08/31/19   [provider]  hydrOXYzine (ATARAX/VISTARIL) 10 MG tablet Take 10 mg by mouth at bedtime as needed for anxiety. 09/23/19   [provider]  ibuprofen (ADVIL) 200 MG tablet Take 200 mg by mouth every 6 (six) hours as needed for moderate pain.    [provider]  ondansetron (ZOFRAN) 4 MG tablet Take 1 tablet (4 mg total) by mouth every 6 (six) hours. 06/08/21   Ellsworth Lennox, PA-C  SYMBICORT 160-4.5 MCG/ACT inhaler Inhale 2 puffs into the lungs daily as needed (sob/wheezing).  09/21/19   [provider]    Family History Family History  Problem Relation Age of Onset   Healthy Mother    Mental illness Father    Hypertension Maternal Grandmother    Heart disease Maternal Grandmother    Mental illness Maternal Grandmother    Diabetes Paternal Grandmother     Social History Social History   Tobacco Use   Smoking status:  Former    Current packs/day: 0.00    Types: Cigarettes    Quit date: 02/07/2014    Years since quitting: 8.6   Smokeless tobacco: Never  Vaping Use   Vaping status: Never Used  Substance Use Topics   Alcohol use: No    Alcohol/week: 0.0 standard drinks of alcohol    Comment: Social   Drug use: Yes    Types: Marijuana     Allergies   Patient has no known allergies.   Review of Systems Review of Systems  Constitutional:  Negative for chills and fever.  Eyes:  Negative for discharge and redness.  Gastrointestinal:  Negative for abdominal pain, nausea and vomiting.  Musculoskeletal:  Positive for back pain and myalgias.  Neurological:  Negative for numbness.     Physical Exam Triage Vital Signs ED Triage Vitals  Encounter Vitals Group     BP 10/12/22 1209 131/89     Systolic BP Percentile --      Diastolic BP Percentile --      Pulse Rate 10/12/22 1209 82     Resp 10/12/22 1209 17     Temp 10/12/22 1209 98.8 F (37.1 C)     Temp Source 10/12/22 1209 Oral     SpO2 10/12/22 1209 98 %     Weight 10/12/22 1209 214 lb (97.1 kg)     Height --      Head Circumference --      Peak Flow --      Pain Score 10/12/22 1208 7     Pain Loc --      Pain Education --      Exclude from Growth Chart --    No data found.  Updated Vital Signs BP 131/89 (BP Location: Right Arm)   Pulse 82   Temp 98.8 F (37.1 C) (Oral)   Resp 17   Wt 214 lb (97.1 kg)   SpO2 98%   BMI 37.91 kg/m     Physical Exam Vitals and nursing note reviewed.  Constitutional:      General: She is not in acute distress.    Appearance: Normal appearance. She is not ill-appearing.  HENT:     Head: Normocephalic and atraumatic.  Eyes:     Conjunctiva/sclera: Conjunctivae normal.  Cardiovascular:     Rate and Rhythm: Normal rate.  Pulmonary:     Effort: Pulmonary effort is normal. No respiratory distress.  Musculoskeletal:  Comments: No tenderness to palpation of midline spine with the exception  of lower lumbar spine.  Tenderness to palpation noted across lower back with muscle spasms noted to paraspinal muscles  Neurological:     Mental Status: She is alert.  Psychiatric:        Mood and Affect: Mood normal.        Behavior: Behavior normal.        Thought Content: Thought content normal.      UC Treatments / Results  Labs (all labs ordered are listed, but only abnormal results are displayed) Labs Reviewed - No data to display  EKG   Radiology No results found.  Procedures Procedures (including critical care time)  Medications Ordered in UC Medications - No data to display  Initial Impression / Assessment and Plan / UC Course  I have reviewed the triage vital signs and the nursing notes.  Pertinent labs & imaging results that were available during my care of the patient were reviewed by me and considered in my medical decision making (see chart for details).   Will treat with steroid burst as well as muscle relaxer.  Advised to follow-up with primary care soon as possible as she may need referral to specialist given duration of symptoms.  Patient expresses understanding.  She will follow-up sooner with any further concerns.  Encouraged to massage and heat to also help with muscle relaxation and pain relief.   Final Clinical Impressions(s) / UC Diagnoses   Final diagnoses:  Chronic bilateral low back pain, unspecified whether sciatica present     Discharge Instructions       Please follow up with primary care as soon as possible.      ED Prescriptions     Medication Sig Dispense Auth. Provider   predniSONE (DELTASONE) 20 MG tablet Take 2 tablets (40 mg total) by mouth daily with breakfast for 5 days. 10 tablet Erma Pinto F, PA-C   tiZANidine (ZANAFLEX) 4 MG tablet Take 1 tablet (4 mg total) by mouth every 6 (six) hours as needed for muscle spasms. 30 tablet Tomi Bamberger, PA-C      PDMP not reviewed this encounter.   Tomi Bamberger,  PA-C 10/12/22 1328

## 2022-10-12 NOTE — Discharge Instructions (Signed)
Please follow up with primary care as soon as possible.

## 2022-10-20 ENCOUNTER — Other Ambulatory Visit: Payer: Self-pay

## 2022-10-20 ENCOUNTER — Ambulatory Visit
Admission: EM | Admit: 2022-10-20 | Discharge: 2022-10-20 | Disposition: A | Discharge status: Home / Self Care | Payer: Medicaid Other | Attending: Physician Assistant | Admitting: Physician Assistant

## 2022-10-20 ENCOUNTER — Encounter: Payer: Self-pay | Admitting: *Deleted

## 2022-10-20 DIAGNOSIS — J0100 Acute maxillary sinusitis, unspecified: Secondary | ICD-10-CM | POA: Diagnosis not present

## 2022-10-20 MED ORDER — AMOXICILLIN-POT CLAVULANATE 875-125 MG PO TABS: 1.0000 | ORAL_TABLET | Freq: Two times a day (BID) | ORAL | 0 refills | Status: DC

## 2022-10-20 NOTE — ED Provider Notes (Signed)
[facility]    CSN: [id number] Arrival date & time: 10/20/22  1418      History   Chief Complaint Chief Complaint  Patient presents with   Nasal Congestion    HPI Christy Bell is a 32 y.o. female.   Patient here today for evaluation of sinus congestion and cough that she has had for the last week.  Children have had similar symptoms.  She has not any fever.  She denies any vomiting or diarrhea.  She has taken over-the-counter medication without resolution.  The history is provided by the patient.    Past Medical History:  Diagnosis Date   Anxiety    Asthma    Bronchitis    Depression    Fatty liver    GERD (gastroesophageal reflux disease)    Gestational diabetes    Headache    Hx of chlamydia infection    Preeclampsia    Resolved after childbirth    Patient Active Problem List   Diagnosis Date Noted   Status post repeat low transverse cesarean section 02/26/2017    Past Surgical History:  Procedure Laterality Date   CESAREAN SECTION N/A 09/07/2014   Procedure: CESAREAN SECTION;  Surgeon: Essie Hart, MD;  Location: [facility];  Service: Obstetrics;  Laterality: N/A;   CESAREAN SECTION N/A 02/26/2017   Procedure: REPEAT CESAREAN SECTION;  Surgeon: Essie Hart, MD;  Location: [facility];  Service: Obstetrics;  Laterality: N/A;  Tracey RNFA   CHOLECYSTECTOMY N/A 10/29/2019   Procedure: LAPAROSCOPIC CHOLECYSTECTOMY;  Surgeon: Almond Lint, MD;  Location: [facility];  Service: General;  Laterality: N/A;   GALLBLADDER SURGERY     WISDOM TOOTH EXTRACTION     x1    OB History     Gravida  2   Para  1   Term      Preterm  1   AB      Living  1      SAB      IAB      Ectopic      Multiple  0   Live Births  1            Home Medications    Prior to Admission medications   Medication Sig Start Date End Date Taking? Authorizing Provider  acetaminophen (TYLENOL) 500 MG tablet Take 500 mg by mouth every 6 (six) hours as  needed for moderate pain.   Yes [provider]  albuterol (VENTOLIN HFA) 108 (90 Base) MCG/ACT inhaler Inhale 1-2 puffs into the lungs every 4 (four) hours as needed for wheezing or shortness of breath. 08/31/19  Yes [provider]  amoxicillin-clavulanate (AUGMENTIN) 875-125 MG tablet Take 1 tablet by mouth every 12 (twelve) hours. 10/20/22  Yes Tomi Bamberger, PA-C  cetirizine (ZYRTEC) 10 MG tablet Take 10 mg by mouth daily as needed for allergies. 08/31/19  Yes [provider]  hydrOXYzine (ATARAX/VISTARIL) 10 MG tablet Take 10 mg by mouth at bedtime as needed for anxiety. 09/23/19  Yes [provider]  ibuprofen (ADVIL) 200 MG tablet Take 200 mg by mouth every 6 (six) hours as needed for moderate pain.   Yes [provider]  sertraline (ZOLOFT) 50 MG tablet Take 100 mg by mouth daily.   Yes [provider]  tiZANidine (ZANAFLEX) 4 MG tablet Take 1 tablet (4 mg total) by mouth every 6 (six) hours as needed for muscle spasms. 10/12/22  Yes Tomi Bamberger, PA-C  ondansetron [facility]) 4  MG tablet Take 1 tablet (4 mg total) by mouth every 6 (six) hours. 06/08/21   Ellsworth Lennox, PA-C  SYMBICORT 160-4.5 MCG/ACT inhaler Inhale 2 puffs into the lungs daily as needed (sob/wheezing).  09/21/19   [provider]    Family History Family History  Problem Relation Age of Onset   Healthy Mother    Mental illness Father    Hypertension Maternal Grandmother    Heart disease Maternal Grandmother    Mental illness Maternal Grandmother    Diabetes Paternal Grandmother     Social History Social History   Tobacco Use   Smoking status: Former    Current packs/day: 0.00    Types: Cigarettes    Quit date: 02/07/2014    Years since quitting: 8.7   Smokeless tobacco: Never  Vaping Use   Vaping status: Never Used  Substance Use Topics   Alcohol use: No    Alcohol/week: 0.0 standard drinks of alcohol    Comment: Social   Drug use: Yes    Types:  Marijuana     Allergies   Patient has no known allergies.   Review of Systems Review of Systems  Constitutional:  Negative for chills and fever.  HENT:  Positive for congestion, sinus pressure and sore throat. Negative for ear pain.   Eyes:  Negative for discharge and redness.  Respiratory:  Positive for cough. Negative for shortness of breath and wheezing.   Gastrointestinal:  Negative for abdominal pain, diarrhea, nausea and vomiting.     Physical Exam Triage Vital Signs ED Triage Vitals  Encounter Vitals Group     BP 10/20/22 1431 121/81     Systolic BP Percentile --      Diastolic BP Percentile --      Pulse Rate 10/20/22 1431 84     Resp 10/20/22 1431 18     Temp 10/20/22 1431 98.9 F (37.2 C)     Temp Source 10/20/22 1431 Oral     SpO2 10/20/22 1431 100 %     Weight --      Height --      Head Circumference --      Peak Flow --      Pain Score 10/20/22 1432 6     Pain Loc --      Pain Education --      Exclude from Growth Chart --    No data found.  Updated Vital Signs BP 121/81 (BP Location: Right Arm)   Pulse 84   Temp 98.9 F (37.2 C) (Oral)   Resp 18   SpO2 100%        Physical Exam Vitals and nursing note reviewed.  Constitutional:      General: She is not in acute distress.    Appearance: Normal appearance. She is not ill-appearing.  HENT:     Head: Normocephalic and atraumatic.     Nose: Congestion present.     Mouth/Throat:     Mouth: Mucous membranes are moist.     Pharynx: No oropharyngeal exudate or posterior oropharyngeal erythema.  Eyes:     Conjunctiva/sclera: Conjunctivae normal.  Cardiovascular:     Rate and Rhythm: Normal rate and regular rhythm.     Heart sounds: Normal heart sounds. No murmur heard. Pulmonary:     Effort: Pulmonary effort is normal. No respiratory distress.     Breath sounds: Normal breath sounds. No wheezing, rhonchi or rales.  Skin:    General: Skin is warm and dry.  Neurological:     Mental Status:  She is alert.  Psychiatric:        Mood and Affect: Mood normal.        Thought Content: Thought content normal.      UC Treatments / Results  Labs (all labs ordered are listed, but only abnormal results are displayed) Labs Reviewed - No data to display  EKG   Radiology No results found.  Procedures Procedures (including critical care time)  Medications Ordered in UC Medications - No data to display  Initial Impression / Assessment and Plan / UC Course  I have reviewed the triage vital signs and the nursing notes.  Pertinent labs & imaging results that were available during my care of the patient were reviewed by me and considered in my medical decision making (see chart for details).    Will treat to cover sinusitis with Augmentin.  Recommended follow-up if no gradual improvement with any further concerns.  Patient expresses understanding.  Final Clinical Impressions(s) / UC Diagnoses   Final diagnoses:  Acute maxillary sinusitis, recurrence not specified   Discharge Instructions   None    ED Prescriptions     Medication Sig Dispense Auth. Provider   amoxicillin-clavulanate (AUGMENTIN) 875-125 MG tablet Take 1 tablet by mouth every 12 (twelve) hours. 14 tablet Tomi Bamberger, PA-C      PDMP not reviewed this encounter.   Tomi Bamberger, PA-C 10/22/22 [phone]

## 2022-10-20 NOTE — ED Triage Notes (Signed)
Pt reports 1 week of sinus congestion and cough. Children have had similar symptoms

## 2022-10-22 ENCOUNTER — Encounter: Payer: Self-pay | Admitting: Physician Assistant

## 2022-10-29 ENCOUNTER — Ambulatory Visit (INDEPENDENT_AMBULATORY_CARE_PROVIDER_SITE_OTHER): Payer: Medicaid Other | Admitting: Family Medicine

## 2022-10-29 VITALS — BP 114/77 | HR 94 | Temp 98.1°F | Resp 16 | Ht 63.0 in | Wt 216.0 lb

## 2022-10-29 DIAGNOSIS — N62 Hypertrophy of breast: Secondary | ICD-10-CM

## 2022-10-29 DIAGNOSIS — M5442 Lumbago with sciatica, left side: Secondary | ICD-10-CM | POA: Diagnosis not present

## 2022-10-29 DIAGNOSIS — G8929 Other chronic pain: Secondary | ICD-10-CM | POA: Diagnosis not present

## 2022-10-29 DIAGNOSIS — E6609 Other obesity due to excess calories: Secondary | ICD-10-CM | POA: Diagnosis not present

## 2022-10-29 DIAGNOSIS — M5441 Lumbago with sciatica, right side: Secondary | ICD-10-CM | POA: Diagnosis not present

## 2022-10-29 DIAGNOSIS — M546 Pain in thoracic spine: Secondary | ICD-10-CM

## 2022-10-29 DIAGNOSIS — Z6838 Body mass index (BMI) 38.0-38.9, adult: Secondary | ICD-10-CM

## 2022-10-29 DIAGNOSIS — Z7689 Persons encountering health services in other specified circumstances: Secondary | ICD-10-CM

## 2022-10-29 MED ORDER — TRIAMCINOLONE ACETONIDE 40 MG/ML IJ SUSP
40.0000 mg | Freq: Once | INTRAMUSCULAR | Status: AC
Start: 2022-10-29 — End: 2022-10-29
  Administered 2022-10-29: 40 mg via INTRAMUSCULAR

## 2022-10-29 NOTE — Progress Notes (Unsigned)
New Patient Office Visit  Subjective    Patient ID: Christy Bell, female    DOB: 1990-09-18  Age: 32 y.o. MRN: [id number]  CC:  Chief Complaint  Patient presents with   Establish Care    HPI [facility] presents to establish care and for complaint of back pain. Denies known trauma or injury. Symptoms have been for several months/years. Also upper back pain related to large breasts.   Outpatient Encounter Medications as of 10/29/2022  Medication Sig   acetaminophen (TYLENOL) 500 MG tablet Take 500 mg by mouth every 6 (six) hours as needed for moderate pain.   albuterol (VENTOLIN HFA) 108 (90 Base) MCG/ACT inhaler Inhale 1-2 puffs into the lungs every 4 (four) hours as needed for wheezing or shortness of breath.   amoxicillin-clavulanate (AUGMENTIN) 875-125 MG tablet Take 1 tablet by mouth every 12 (twelve) hours.   cetirizine (ZYRTEC) 10 MG tablet Take 10 mg by mouth daily as needed for allergies.   hydrOXYzine (ATARAX/VISTARIL) 10 MG tablet Take 10 mg by mouth at bedtime as needed for anxiety.   ibuprofen (ADVIL) 200 MG tablet Take 200 mg by mouth every 6 (six) hours as needed for moderate pain.   ondansetron (ZOFRAN) 4 MG tablet Take 1 tablet (4 mg total) by mouth every 6 (six) hours.   sertraline (ZOLOFT) 50 MG tablet Take 100 mg by mouth daily.   SYMBICORT 160-4.5 MCG/ACT inhaler Inhale 2 puffs into the lungs daily as needed (sob/wheezing).    tiZANidine (ZANAFLEX) 4 MG tablet Take 1 tablet (4 mg total) by mouth every 6 (six) hours as needed for muscle spasms.   No facility-administered encounter medications on file as of 10/29/2022.    Past Medical History:  Diagnosis Date   Anxiety    Asthma    Bronchitis    Depression    Fatty liver    GERD (gastroesophageal reflux disease)    Gestational diabetes    Headache    Hx of chlamydia infection    Preeclampsia    Resolved after childbirth    Past Surgical History:  Procedure Laterality Date   CESAREAN  SECTION N/A 09/07/2014   Procedure: CESAREAN SECTION;  Surgeon: Essie Hart, MD;  Location: [facility];  Service: Obstetrics;  Laterality: N/A;   CESAREAN SECTION N/A 02/26/2017   Procedure: REPEAT CESAREAN SECTION;  Surgeon: Essie Hart, MD;  Location: [facility]TH[facility] SUIT[facility];  Service: Obstetrics;  Laterality: N/A;  Tracey RNFA   CHOLECYSTECTOMY N/A 10/29/2019   Procedure: LAPAROSCOPIC CHOLECYSTECTOMY;  Surgeon: Almond Lint, MD;  Location: [facility];  Service: General;  Laterality: N/A;   GALLBLADDER SURGERY     WISDOM TOOTH EXTRACTION     x1    Family History  Problem Relation Age of Onset   Healthy Mother    Mental illness Father    Hypertension Maternal Grandmother    Heart disease Maternal Grandmother    Mental illness Maternal Grandmother    Diabetes Paternal Grandmother     Social History   Socioeconomic History   Marital status: Single    Spouse name: Not on file   Number of children: Not on file   Years of education: Not on file   Highest education level: Not on file  Occupational History   Not on file  Tobacco Use   Smoking status: Former    Current packs/day: 0.00    Types: Cigarettes    Quit date: 02/07/2014    Years since quitting: 8.7   Smokeless tobacco: Never  Vaping Use   Vaping status: Never Used  Substance and Sexual Activity   Alcohol use: No    Alcohol/week: 0.0 standard drinks of alcohol    Comment: Social   Drug use: Yes    Types: Marijuana   Sexual activity: Yes    Birth control/protection: None, I.U.D.  Other Topics Concern   Not on file  Social History Narrative   Not on file   Social Determinants of Health   Financial Resource Strain: Low Risk  (12/21/2021)   Received from [facility]   Overall Financial Resource Strain (CARDIA)    Difficulty of Paying Living Expenses: Not hard at all  Food Insecurity: No Food Insecurity (12/21/2021)   Received from [facility]   Hunger Vital Sign    Worried About Running Out of Food in the Last Year:  Never true    Ran Out of Food in the Last Year: Never true  Transportation Needs: No Transportation Needs (12/21/2021)   Received from [facility] - Transportation    Lack of Transportation (Medical): No    Lack of Transportation (Non-Medical): No  Physical Activity: Not on file  Stress: Not on file  Social Connections: Not on file  Intimate Partner Violence: Not on file    ROS      Objective    BP 114/77   Pulse 94   Temp 98.1 F (36.7 C) (Oral)   Resp 16   Ht 5\' 3"  (1.6 m)   Wt 216 lb (98 kg)   SpO2 98%   BMI 38.26 kg/m   Physical Exam  {Labs (Optional):23779}    Assessment & Plan:   Chronic midline low back pain with bilateral sciatica  Macromastia  Chronic bilateral thoracic back pain  Class 2 obesity due to excess calories without serious comorbidity with body mass index (BMI) of 38.0 to 38.9 in adult  Encounter to establish care     No follow-ups on file.   Tommie Raymond, MD

## 2022-10-30 ENCOUNTER — Encounter: Payer: Self-pay | Admitting: Family Medicine

## 2022-12-24 ENCOUNTER — Encounter: Payer: Self-pay | Admitting: Emergency Medicine

## 2022-12-24 ENCOUNTER — Ambulatory Visit
Admission: EM | Admit: 2022-12-24 | Discharge: 2022-12-24 | Disposition: A | Discharge status: Home / Self Care | Payer: Medicaid Other | Attending: Physician Assistant | Admitting: Physician Assistant

## 2022-12-24 DIAGNOSIS — J45901 Unspecified asthma with (acute) exacerbation: Secondary | ICD-10-CM

## 2022-12-24 MED ORDER — PREDNISONE 20 MG PO TABS: 40.0000 mg | ORAL_TABLET | Freq: Every day | ORAL | 0 refills | Status: AC

## 2022-12-24 NOTE — ED Provider Notes (Signed)
[facility]    CSN: [id number] Arrival date & time: 12/24/22  1220      History   Chief Complaint Chief Complaint  Patient presents with   Cough    HPI Christy Bell is a 32 y.o. female.   Patient here today for evaluation of cough, congestion.  She does have history of asthma.  She has been using her inhaler more frequently.  She has not had fever.  The history is provided by the patient.  Cough Associated symptoms: sore throat and wheezing   Associated symptoms: no chills, no ear pain, no eye discharge, no fever and no shortness of breath     Past Medical History:  Diagnosis Date   Anxiety    Asthma    Bronchitis    Depression    Fatty liver    GERD (gastroesophageal reflux disease)    Gestational diabetes    Headache    Hx of chlamydia infection    Preeclampsia    Resolved after childbirth    Patient Active Problem List   Diagnosis Date Noted   Status post repeat low transverse cesarean section 02/26/2017    Past Surgical History:  Procedure Laterality Date   CESAREAN SECTION N/A 09/07/2014   Procedure: CESAREAN SECTION;  Surgeon: Essie Hart, MD;  Location: [facility];  Service: Obstetrics;  Laterality: N/A;   CESAREAN SECTION N/A 02/26/2017   Procedure: REPEAT CESAREAN SECTION;  Surgeon: Essie Hart, MD;  Location: [facility];  Service: Obstetrics;  Laterality: N/A;  Tracey RNFA   CHOLECYSTECTOMY N/A 10/29/2019   Procedure: LAPAROSCOPIC CHOLECYSTECTOMY;  Surgeon: Almond Lint, MD;  Location: [facility];  Service: General;  Laterality: N/A;   GALLBLADDER SURGERY     WISDOM TOOTH EXTRACTION     x1    OB History     Gravida  2   Para  1   Term      Preterm  1   AB      Living  1      SAB      IAB      Ectopic      Multiple  0   Live Births  1            Home Medications    Prior to Admission medications   Medication Sig Start Date End Date Taking? Authorizing Provider  acetaminophen (TYLENOL) 500 MG  tablet Take 500 mg by mouth every 6 (six) hours as needed for moderate pain.    [provider]  albuterol (VENTOLIN HFA) 108 (90 Base) MCG/ACT inhaler Inhale 1-2 puffs into the lungs every 4 (four) hours as needed for wheezing or shortness of breath. 08/31/19   [provider]  amoxicillin-clavulanate (AUGMENTIN) 875-125 MG tablet Take 1 tablet by mouth every 12 (twelve) hours. 10/20/22   Tomi Bamberger, PA-C  cetirizine (ZYRTEC) 10 MG tablet Take 10 mg by mouth daily as needed for allergies. 08/31/19   [provider]  hydrOXYzine (ATARAX/VISTARIL) 10 MG tablet Take 10 mg by mouth at bedtime as needed for anxiety. 09/23/19   [provider]  ibuprofen (ADVIL) 200 MG tablet Take 200 mg by mouth every 6 (six) hours as needed for moderate pain.    [provider]  ondansetron (ZOFRAN) 4 MG tablet Take 1 tablet (4 mg total) by mouth every 6 (six) hours. 06/08/21   Ellsworth Lennox, PA-C  sertraline (ZOLOFT) 50 MG tablet Take 100 mg by mouth daily.    [provider]  SYMBICORT 160-4.5 MCG/ACT inhaler Inhale 2 puffs into the lungs daily as needed (sob/wheezing).  09/21/19   [provider]  tiZANidine (ZANAFLEX) 4 MG tablet Take 1 tablet (4 mg total) by mouth every 6 (six) hours as needed for muscle spasms. 10/12/22   Tomi Bamberger, PA-C    Family History Family History  Problem Relation Age of Onset   Healthy Mother    Mental illness Father    Hypertension Maternal Grandmother    Heart disease Maternal Grandmother    Mental illness Maternal Grandmother    Diabetes Paternal Grandmother     Social History Social History   Tobacco Use   Smoking status: Former    Current packs/day: 0.00    Types: Cigarettes    Quit date: 02/07/2014    Years since quitting: 8.9   Smokeless tobacco: Never  Vaping Use   Vaping status: Never Used  Substance Use Topics   Alcohol use: No    Alcohol/week: 0.0 standard drinks of alcohol    Comment: Social    Drug use: Yes    Types: Marijuana     Allergies   Patient has no known allergies.   Review of Systems Review of Systems  Constitutional:  Negative for chills and fever.  HENT:  Positive for congestion and sore throat. Negative for ear pain.   Eyes:  Negative for discharge and redness.  Respiratory:  Positive for cough and wheezing. Negative for shortness of breath.   Gastrointestinal:  Negative for abdominal pain, diarrhea, nausea and vomiting.     Physical Exam Triage Vital Signs ED Triage Vitals  Encounter Vitals Group     BP 12/24/22 1255 127/79     Systolic BP Percentile --      Diastolic BP Percentile --      Pulse Rate 12/24/22 1255 96     Resp 12/24/22 1255 16     Temp 12/24/22 1255 98.5 F (36.9 C)     Temp Source 12/24/22 1255 Oral     SpO2 12/24/22 1255 97 %     Weight --      Height --      Head Circumference --      Peak Flow --      Pain Score 12/24/22 1256 0     Pain Loc --      Pain Education --      Exclude from Growth Chart --    No data found.  Updated Vital Signs BP 127/79 (BP Location: Right Arm)   Pulse 96   Temp 98.5 F (36.9 C) (Oral)   Resp 16   SpO2 97%      Physical Exam Vitals and nursing note reviewed.  Constitutional:      General: She is not in acute distress.    Appearance: Normal appearance. She is not ill-appearing.  HENT:     Head: Normocephalic and atraumatic.     Nose: Congestion present.     Mouth/Throat:     Mouth: Mucous membranes are moist.     Pharynx: No oropharyngeal exudate or posterior oropharyngeal erythema.  Eyes:     Conjunctiva/sclera: Conjunctivae normal.  Cardiovascular:     Rate and Rhythm: Normal rate and regular rhythm.     Heart sounds: Normal heart sounds. No murmur heard. Pulmonary:     Effort: Pulmonary effort is normal. No respiratory distress.     Breath sounds: Normal breath sounds. No wheezing, rhonchi or rales.  Skin:  General: Skin is warm and dry.  Neurological:     Mental  Status: She is alert.  Psychiatric:        Mood and Affect: Mood normal.        Thought Content: Thought content normal.      UC Treatments / Results  Labs (all labs ordered are listed, but only abnormal results are displayed) Labs Reviewed - No data to display  EKG   Radiology No results found.  Procedures Procedures (including critical care time)  Medications Ordered in UC Medications - No data to display  Initial Impression / Assessment and Plan / UC Course  I have reviewed the triage vital signs and the nursing notes.  Pertinent labs & imaging results that were available during my care of the patient were reviewed by me and considered in my medical decision making (see chart for details).     Steroid burst prescribed to cover asthma exacerbation.  Recommended follow-up if no gradual improvement with any further concerns.  Patient expressed understanding. Final Clinical Impressions(s) / UC Diagnoses   Final diagnoses:  Asthma with acute exacerbation, unspecified asthma severity, unspecified whether persistent   Discharge Instructions   None    ED Prescriptions     Medication Sig Dispense Auth. Provider   predniSONE (DELTASONE) 20 MG tablet Take 2 tablets (40 mg total) by mouth daily with breakfast for 5 days. 10 tablet Tomi Bamberger, PA-C      PDMP not reviewed this encounter.   Tomi Bamberger, PA-C 01/02/23 1849

## 2022-12-24 NOTE — ED Triage Notes (Signed)
Nasal congestion, cough, hx of asthma, using inhalers more frequently, x 3 days. Not taking any OTC meds to treat symptoms.

## 2023-01-07 ENCOUNTER — Ambulatory Visit: Payer: Medicaid Other | Admitting: Family

## 2023-02-18 ENCOUNTER — Ambulatory Visit: Payer: Medicaid Other | Admitting: Physical Medicine and Rehabilitation

## 2023-02-28 ENCOUNTER — Ambulatory Visit: Payer: Medicaid Other | Admitting: Physical Medicine and Rehabilitation

## 2023-03-18 ENCOUNTER — Ambulatory Visit (INDEPENDENT_AMBULATORY_CARE_PROVIDER_SITE_OTHER): Payer: Medicaid Other | Admitting: Physical Medicine and Rehabilitation

## 2023-03-18 ENCOUNTER — Encounter: Payer: Self-pay | Admitting: Physical Medicine and Rehabilitation

## 2023-03-18 VITALS — BP 124/78 | HR 84

## 2023-03-18 DIAGNOSIS — M5442 Lumbago with sciatica, left side: Secondary | ICD-10-CM | POA: Diagnosis not present

## 2023-03-18 DIAGNOSIS — M5416 Radiculopathy, lumbar region: Secondary | ICD-10-CM | POA: Diagnosis not present

## 2023-03-18 DIAGNOSIS — G8929 Other chronic pain: Secondary | ICD-10-CM

## 2023-03-18 DIAGNOSIS — M7918 Myalgia, other site: Secondary | ICD-10-CM

## 2023-03-18 MED ORDER — MELOXICAM 15 MG PO TABS: 15.0000 mg | ORAL_TABLET | Freq: Every day | ORAL | 0 refills | Status: DC

## 2023-03-18 NOTE — Progress Notes (Signed)
Pain scale 5 No Allergies to Contrast dyes No blood thinners

## 2023-03-18 NOTE — Progress Notes (Signed)
Christy Bell - 33 y.o. female MRN [id number]  Date of birth: 03-Oct-1990  Office Visit Note: Visit Date: 03/18/2023 PCP: Georganna Skeans, MD Referred by: Georganna Skeans, MD  Subjective: Chief Complaint  Patient presents with   Lower Back - Pain   HPI: Christy Bell is a 33 y.o. female who comes in today per the request of Dr. Georganna Skeans for evaluation of chronic, worsening and severe bilateral lower back pain, intermittent radiation of pain down left leg. Pain ongoing for 8 plus years, worsens with standing and activity. She describes pain as sharp and sore sensation, currently rates as 5 out of 10. Some relief of pain with home exercise regimen, rest and use of medications. No relief of pain with anti-inflammatory and muscle relaxer medications. History of chiropractic treatments several years ago with some relief of pain. Lumbar radiographs from 2019 show moderate to severe degenerative disc disease and associated mild spondylosis at L1-L2. 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 No history of lumbar surgery/injections. States she was previously seen at another orthopedic practice in town but is unable to remember name or practice/provider. I was unable to locate prior notes in EPIC today. Patient denies focal weakness, numbness and tingling. No recent trauma or falls.      Review of Systems  Musculoskeletal:  Positive for back pain and myalgias.  Neurological:  Negative for tingling, sensory change, focal weakness and weakness.  All other systems reviewed and are negative.  Otherwise per HPI.  Assessment & Plan: Visit Diagnoses:    ICD-10-CM   1. Chronic bilateral low back pain with left-sided sciatica  M54.42 MR LUMBAR SPINE WO CONTRAST   G89.29     2. Lumbar radiculopathy  M54.16 MR LUMBAR SPINE WO CONTRAST    3. Myofascial pain syndrome  M79.18 MR LUMBAR SPINE WO CONTRAST       Plan: Findings:  Chronic, worsening and severe bilateral lower back pain,  intermittent radiation of pain down left leg. Patient continues to have severe pain despite good conservative therapies such as chiropractic treatments, home exercise regimen, rest and use of medications. Patients clinical presentation and exam are consistent with lumbar radiculopathy, her pain pattern does not follow specific dermatomal pattern. I also feel there is a myofascial component contributing to her pain. Myofascial tenderness noted to lumbar paraspinal regions. We discussed treatment plan in detail today, given the chronicity of her symptoms and continued severe discomfort I placed order for lumbar MRI imaging. Depending on results of lumbar MRI imaging we discussed the possibility of performing epidural steroid injection. I also feel it would be beneficial for her to re-group with physical therapy at some point with a focus on manual treatments and dry needling. I will see her back for lumbar MRI review. No red flag symptoms noted upon exam today.     Meds & Orders:  Meds ordered this encounter  Medications   meloxicam (MOBIC) 15 MG tablet    Sig: Take 1 tablet (15 mg total) by mouth daily.    Dispense:  30 tablet    Refill:  0    Orders Placed This Encounter  Procedures   MR LUMBAR SPINE WO CONTRAST    Follow-up: Return for Lumbar MRI review.   Procedures: No procedures performed      Clinical History: No specialty comments available.   She reports that she quit smoking about 9 years ago. Her smoking use included cigarettes. She has never used smokeless tobacco. No results for input(s): "HGBA1C", "  LABURIC" in the last 8760 hours.  Objective:  VS:  HT:    WT:   BMI:     BP:124/78  HR:84bpm  TEMP: ( )  RESP:  Physical Exam Vitals and nursing note reviewed.  HENT:     Head: Normocephalic and atraumatic.     Right Ear: External ear normal.     Left Ear: External ear normal.     Nose: Nose normal.     Mouth/Throat:     Mouth: Mucous membranes are moist.  Eyes:      Extraocular Movements: Extraocular movements intact.  Cardiovascular:     Rate and Rhythm: Normal rate.     Pulses: Normal pulses.  Pulmonary:     Effort: Pulmonary effort is normal.  Abdominal:     General: Abdomen is flat. There is no distension.  Musculoskeletal:        General: Tenderness present.     Cervical back: Normal range of motion.     Comments: Patient rises from seated position to standing without difficulty. Good lumbar range of motion. No pain noted with facet loading. 5/5 strength noted with bilateral hip flexion, knee flexion/extension, ankle dorsiflexion/plantarflexion and EHL. No clonus noted bilaterally. No pain upon palpation of greater trochanters. No pain with internal/external rotation of bilateral hips. Sensation intact bilaterally. Myofascial tenderness noted to bilateral lumbar paraspinal regions. Negative slump test bilaterally. Ambulates without aid, gait steady.     Skin:    General: Skin is warm and dry.     Capillary Refill: Capillary refill takes less than 2 seconds.  Neurological:     General: No focal deficit present.     Mental Status: She is alert and oriented to person, place, and time.  Psychiatric:        Mood and Affect: Mood normal.        Behavior: Behavior normal.     Ortho Exam  Imaging: No results found.  Past Medical/Family/Surgical/Social History: Medications & Allergies reviewed per EMR, new medications updated. Patient Active Problem List   Diagnosis Date Noted   Status post repeat low transverse cesarean section 02/26/2017   Past Medical History:  Diagnosis Date   Anxiety    Asthma    Bronchitis    Depression    Fatty liver    GERD (gastroesophageal reflux disease)    Gestational diabetes    Headache    Hx of chlamydia infection    Preeclampsia    Resolved after childbirth   Family History  Problem Relation Age of Onset   Healthy Mother    Mental illness Father    Hypertension Maternal Grandmother    Heart  disease Maternal Grandmother    Mental illness Maternal Grandmother    Diabetes Paternal Grandmother    Past Surgical History:  Procedure Laterality Date   CESAREAN SECTION N/A 09/07/2014   Procedure: CESAREAN SECTION;  Surgeon: Essie Hart, MD;  Location: [facility];  Service: Obstetrics;  Laterality: N/A;   CESAREAN SECTION N/A 02/26/2017   Procedure: REPEAT CESAREAN SECTION;  Surgeon: Essie Hart, MD;  Location: [facility];  Service: Obstetrics;  Laterality: N/A;  Tracey RNFA   CHOLECYSTECTOMY N/A 10/29/2019   Procedure: LAPAROSCOPIC CHOLECYSTECTOMY;  Surgeon: Almond Lint, MD;  Location: [facility];  Service: General;  Laterality: N/A;   GALLBLADDER SURGERY     WISDOM TOOTH EXTRACTION     x1   Social History   Occupational History   Not on file  Tobacco Use   Smoking status:  Former    Current packs/day: 0.00    Types: Cigarettes    Quit date: 02/07/2014    Years since quitting: 9.1   Smokeless tobacco: Never  Vaping Use   Vaping status: Never Used  Substance and Sexual Activity   Alcohol use: No    Alcohol/week: 0.0 standard drinks of alcohol    Comment: Social   Drug use: Yes    Types: Marijuana   Sexual activity: Yes    Birth control/protection: None, I.U.D.

## 2023-03-19 ENCOUNTER — Encounter: Payer: Self-pay | Admitting: Physical Medicine and Rehabilitation

## 2023-03-30 ENCOUNTER — Ambulatory Visit
Admission: RE | Admit: 2023-03-30 | Discharge: 2023-03-30 | Disposition: A | Discharge status: Home / Self Care | Payer: Medicaid Other | Source: Ambulatory Visit | Attending: Physical Medicine and Rehabilitation | Admitting: Physical Medicine and Rehabilitation

## 2023-03-30 DIAGNOSIS — G8929 Other chronic pain: Secondary | ICD-10-CM

## 2023-03-30 DIAGNOSIS — M7918 Myalgia, other site: Secondary | ICD-10-CM

## 2023-03-30 DIAGNOSIS — M5416 Radiculopathy, lumbar region: Secondary | ICD-10-CM

## 2023-04-11 ENCOUNTER — Ambulatory Visit: Admitting: Physical Medicine and Rehabilitation

## 2023-04-11 VITALS — BP 109/78 | HR 82

## 2023-04-11 DIAGNOSIS — M546 Pain in thoracic spine: Secondary | ICD-10-CM | POA: Diagnosis not present

## 2023-04-11 DIAGNOSIS — M7918 Myalgia, other site: Secondary | ICD-10-CM | POA: Diagnosis not present

## 2023-04-11 DIAGNOSIS — G8929 Other chronic pain: Secondary | ICD-10-CM | POA: Diagnosis not present

## 2023-04-11 DIAGNOSIS — M545 Low back pain, unspecified: Secondary | ICD-10-CM

## 2023-04-11 NOTE — Progress Notes (Signed)
 Christy Bell - 33 y.o. female MRN [id number]  Date of birth: 08/12/1990  Office Visit Note: Visit Date: 04/11/2023 PCP: Georganna Skeans, MD Referred by: Georganna Skeans, MD  Subjective: Chief Complaint  Patient presents with   Lower Back - Pain   HPI: Christy Bell is a 33 y.o. female who comes in today for evaluation of chronic, worsening and severe bilateral lower back pain, intermittent radiation down left leg. Pain ongoing for 8 plus years. Her pain worsens with movement and activity. She describes pain as sore and tight sensation, currently rates as 8 out of 10. Some relief of pain with home exercise regimen, rest and use of medications. No relief of pain with anti-inflammatory and muscle relaxer medications. History of chiropractic treatments several years ago with some relief of pain. Recent lumbar MRI imaging exhibits mild lumbar disc and facet degeneration without stenosis. No high grade spinal canal stenosis. She does report muscle tightness to entire back and legs, especially when performing household chores and with activity. Patient denies focal weakness, numbness and tingling. No recent trauma or falls.      Review of Systems  Musculoskeletal:  Positive for back pain and myalgias.  Neurological:  Negative for tingling, sensory change, focal weakness and weakness.  All other systems reviewed and are negative.  Otherwise per HPI.  Assessment & Plan: Visit Diagnoses:    ICD-10-CM   1. Chronic bilateral low back pain without sciatica  M54.50    G89.29     2. Chronic bilateral thoracic back pain  M54.6    G89.29     3. Myofascial pain syndrome  M79.18        Plan: Findings:  Chronic, worsening and severe bilateral lower back pain, intermittent radiation of pain down left leg. She continues to have pain despite good conservative therapies such as chiropractic treatments, home exercise regimen, rest and use of medications. I discussed recent lumbar MRI with  patient today using imaging and spine model. Lumbar MRI imaging looks fairly good for her age, no nerve impingement, no severe spinal canal stenosis. Patients clinical presentation and exam are consistent with myofascial pain syndrome. There is diffuse myofascial tenderness to her entire back, most prominent to lower lumbar paraspinal regions. We discussed treatment plan in detail including re-grouping with chiropractic/physical therapy. She would like to try new chiropractor, I did provide her with a list of our recommendations. Should her pain persist we did discuss possibility of diagnostic lumbar epidural steroid injection. I encouraged her to remain active at home. She has no questions at this time. No red flag symptoms noted upon exam.     Meds & Orders: No orders of the defined types were placed in this encounter.  No orders of the defined types were placed in this encounter.   Follow-up: Return if symptoms worsen or fail to improve.   Procedures: No procedures performed      Clinical History: Narrative & Impression CLINICAL DATA:  Low back pain, symptoms persist with > 6 wks treatment. Worsening chronic low back pain radiating to the left side with numbness and tingling in the hands and feet.   EXAM: MRI LUMBAR SPINE WITHOUT CONTRAST   TECHNIQUE: Multiplanar, multisequence MR imaging of the lumbar spine was performed. No intravenous contrast was administered.   COMPARISON:  Lumbar spine radiographs 07/07/2017   FINDINGS: Segmentation:  Standard.   Alignment:  Normal.   Vertebrae: No fracture, suspicious marrow lesion, or significant marrow edema.   Conus medullaris and cauda equina:  Conus extends to the L1 level. Conus and cauda equina appear normal.   Paraspinal and other soft tissues: Unremarkable.   Disc levels:   T11-12: Only imaged sagittally. Disc desiccation and mild disc space narrowing. Small central disc extrusion with mild caudal migration. No evidence  of significant stenosis.   T12-L1: Mild facet arthrosis without disc herniation or stenosis.   L1-2: Disc desiccation and moderate to severe disc space narrowing. Disc bulging without stenosis.   L2-3: Mild disc bulging and mild facet hypertrophy without stenosis.   L3-4: Mild disc bulging and mild facet hypertrophy without stenosis.   L4-5: Mild disc bulging and mild facet hypertrophy without stenosis.   L5-S1: Disc desiccation. Minimal disc bulging and mild facet hypertrophy without stenosis.   IMPRESSION: Mild lumbar disc and facet degeneration without stenosis.     Electronically Signed   By: Sebastian Ache M.D.   On: 04/09/2023 11:50   She reports that she quit smoking about 9 years ago. Her smoking use included cigarettes. She has never used smokeless tobacco. No results for input(s): "HGBA1C", "LABURIC" in the last 8760 hours.  Objective:  VS:  HT:    WT:   BMI:     BP:109/78  HR:82bpm  TEMP: ( )  RESP:  Physical Exam Vitals and nursing note reviewed.  HENT:     Head: Normocephalic and atraumatic.     Right Ear: External ear normal.     Left Ear: External ear normal.     Nose: Nose normal.     Mouth/Throat:     Mouth: Mucous membranes are moist.  Eyes:     Extraocular Movements: Extraocular movements intact.  Cardiovascular:     Rate and Rhythm: Normal rate.     Pulses: Normal pulses.  Pulmonary:     Effort: Pulmonary effort is normal.  Abdominal:     General: Abdomen is flat. There is no distension.  Musculoskeletal:        General: Tenderness present.     Cervical back: Normal range of motion.     Comments: Patient rises from seated position to standing without difficulty. Good lumbar range of motion. No pain noted with facet loading. 5/5 strength noted with bilateral hip flexion, knee flexion/extension, ankle dorsiflexion/plantarflexion and EHL. No clonus noted bilaterally. No pain upon palpation of greater trochanters. No pain with internal/external  rotation of bilateral hips. Sensation intact bilaterally. Myofascial tenderness noted to bilateral thoracic and lumbar paraspinal regions upon palpation. Negative slump test bilaterally. Ambulates without aid, gait steady.     Skin:    General: Skin is warm and dry.     Capillary Refill: Capillary refill takes less than 2 seconds.  Neurological:     General: No focal deficit present.     Mental Status: She is alert and oriented to person, place, and time.  Psychiatric:        Mood and Affect: Mood normal.        Behavior: Behavior normal.     Ortho Exam  Imaging: No results found.  Past Medical/Family/Surgical/Social History: Medications & Allergies reviewed per EMR, new medications updated. Patient Active Problem List   Diagnosis Date Noted   Status post repeat low transverse cesarean section 02/26/2017   Past Medical History:  Diagnosis Date   Anxiety    Asthma    Bronchitis    Depression    Fatty liver    GERD (gastroesophageal reflux disease)    Gestational diabetes    Headache  Hx of chlamydia infection    Preeclampsia    Resolved after childbirth   Family History  Problem Relation Age of Onset   Healthy Mother    Mental illness Father    Hypertension Maternal Grandmother    Heart disease Maternal Grandmother    Mental illness Maternal Grandmother    Diabetes Paternal Grandmother    Past Surgical History:  Procedure Laterality Date   CESAREAN SECTION N/A 09/07/2014   Procedure: CESAREAN SECTION;  Surgeon: Essie Hart, MD;  Location: [facility];  Service: Obstetrics;  Laterality: N/A;   CESAREAN SECTION N/A 02/26/2017   Procedure: REPEAT CESAREAN SECTION;  Surgeon: Essie Hart, MD;  Location: [facility] SU[facility];  Service: Obstetrics;  Laterality: N/A;  Tracey RNFA   CHOLECYSTECTOMY N/A 10/29/2019   Procedure: LAPAROSCOPIC CHOLECYSTECTOMY;  Surgeon: Almond Lint, MD;  Location: [facility];  Service: General;  Laterality: N/A;   GALLBLADDER SURGERY     WISDOM TOOTH  EXTRACTION     x1   Social History   Occupational History   Not on file  Tobacco Use   Smoking status: Former    Current packs/day: 0.00    Types: Cigarettes    Quit date: 02/07/2014    Years since quitting: 9.1   Smokeless tobacco: Never  Vaping Use   Vaping status: Never Used  Substance and Sexual Activity   Alcohol use: Not Currently    Comment: Social   Drug use: Yes    Types: Marijuana   Sexual activity: Yes    Birth control/protection: None, I.U.D.

## 2023-04-11 NOTE — Progress Notes (Signed)
 Core Outcome Measures Index (COMI) Back Score  Average Pain 8  COMI Score  70%

## 2023-04-11 NOTE — Progress Notes (Signed)
 Pain Scale   Average Pain 7

## 2023-04-14 ENCOUNTER — Other Ambulatory Visit: Payer: Self-pay

## 2023-04-14 ENCOUNTER — Encounter: Payer: Self-pay | Admitting: *Deleted

## 2023-04-14 ENCOUNTER — Ambulatory Visit: Admission: EM | Admit: 2023-04-14 | Discharge: 2023-04-14 | Disposition: A | Discharge status: Home / Self Care

## 2023-04-14 DIAGNOSIS — B349 Viral infection, unspecified: Secondary | ICD-10-CM | POA: Diagnosis not present

## 2023-04-14 DIAGNOSIS — R112 Nausea with vomiting, unspecified: Secondary | ICD-10-CM | POA: Diagnosis not present

## 2023-04-14 DIAGNOSIS — R197 Diarrhea, unspecified: Secondary | ICD-10-CM

## 2023-04-14 LAB — POC COVID19/FLU A&B COMBO
Covid Antigen, POC: NEGATIVE
Influenza A Antigen, POC: NEGATIVE
Influenza B Antigen, POC: NEGATIVE

## 2023-04-14 NOTE — ED Provider Notes (Signed)
 [facility]    CSN: [id number] Arrival date & time: 04/14/23  1322      History   Chief Complaint Chief Complaint  Patient presents with   Emesis    HPI Christy Bell is a 33 y.o. female.   Patient presents with nausea, vomiting, diarrhea that started yesterday.  Patient states that her daughters have similar symptoms.  She has been able to tolerate water and cereal this morning.  Tmax at home was 100.   Emesis   Past Medical History:  Diagnosis Date   Anxiety    Asthma    Bronchitis    Depression    Fatty liver    GERD (gastroesophageal reflux disease)    Gestational diabetes    Headache    Hx of chlamydia infection    Preeclampsia    Resolved after childbirth    Patient Active Problem List   Diagnosis Date Noted   Status post repeat low transverse cesarean section 02/26/2017    Past Surgical History:  Procedure Laterality Date   CESAREAN SECTION N/A 09/07/2014   Procedure: CESAREAN SECTION;  Surgeon: Essie Hart, MD;  Location: [facility];  Service: Obstetrics;  Laterality: N/A;   CESAREAN SECTION N/A 02/26/2017   Procedure: REPEAT CESAREAN SECTION;  Surgeon: Essie Hart, MD;  Location: [facility] BIRTHING SUIT[facility];  Service: Obstetrics;  Laterality: N/A;  Tracey RNFA   CHOLECYSTECTOMY N/A 10/29/2019   Procedure: LAPAROSCOPIC CHOLECYSTECTOMY;  Surgeon: Almond Lint, MD;  Location: [facility];  Service: General;  Laterality: N/A;   GALLBLADDER SURGERY     WISDOM TOOTH EXTRACTION     x1    OB History     Gravida  2   Para  1   Term      Preterm  1   AB      Living  1      SAB      IAB      Ectopic      Multiple  0   Live Births  1            Home Medications    Prior to Admission medications   Medication Sig Start Date End Date Taking? Authorizing Provider  levonorgestrel (MIRENA, 52 MG,) 20 MCG/DAY IUD 1 each by Intrauterine route once. 04/12/17  Yes [provider]  acetaminophen (TYLENOL) 500 MG tablet Take 500 mg by  mouth every 6 (six) hours as needed for moderate pain.   Yes [provider]  albuterol (VENTOLIN HFA) 108 (90 Base) MCG/ACT inhaler Inhale 1-2 puffs into the lungs every 4 (four) hours as needed for wheezing or shortness of breath. 08/31/19  Yes [provider]  amoxicillin-clavulanate (AUGMENTIN) 875-125 MG tablet Take 1 tablet by mouth every 12 (twelve) hours. Patient not taking: Reported on 04/14/2023 10/20/22   Tomi Bamberger, PA-C  cetirizine (ZYRTEC) 10 MG tablet Take 10 mg by mouth daily as needed for allergies. 08/31/19  Yes [provider]  cyclobenzaprine (FLEXERIL) 5 MG tablet Take 1 tablet by mouth 3 (three) times daily as needed.   Yes [provider]  hydrOXYzine (ATARAX/VISTARIL) 10 MG tablet Take 10 mg by mouth at bedtime as needed for anxiety. 09/23/19  Yes [provider]  ibuprofen (ADVIL) 200 MG tablet Take 200 mg by mouth every 6 (six) hours as needed for moderate pain.   Yes [provider]  meloxicam (MOBIC) 15 MG tablet Take 1 tablet (15 mg total) by mouth daily. 03/18/23 03/17/24  Mayford Knife,  Megan E, NP  ondansetron (ZOFRAN) 4 MG tablet Take 1 tablet (4 mg total) by mouth every 6 (six) hours. Patient not taking: Reported on 04/14/2023 06/08/21   Ellsworth Lennox, PA-C  sertraline (ZOLOFT) 50 MG tablet Take 100 mg by mouth daily. Patient not taking: Reported on 04/14/2023    [provider]  SYMBICORT 160-4.5 MCG/ACT inhaler Inhale 2 puffs into the lungs daily as needed (sob/wheezing).  Patient not taking: Reported on 04/14/2023 09/21/19   [provider]  tiZANidine (ZANAFLEX) 4 MG tablet Take 1 tablet (4 mg total) by mouth every 6 (six) hours as needed for muscle spasms. Patient not taking: Reported on 04/14/2023 10/12/22   Tomi Bamberger, PA-C    Family History Family History  Problem Relation Age of Onset   Healthy Mother    Mental illness Father    Hypertension Maternal Grandmother    Heart disease Maternal  Grandmother    Mental illness Maternal Grandmother    Diabetes Paternal Grandmother     Social History Social History   Tobacco Use   Smoking status: Former    Current packs/day: 0.00    Types: Cigarettes    Quit date: 02/07/2014    Years since quitting: 9.1   Smokeless tobacco: Never  Vaping Use   Vaping status: Never Used  Substance Use Topics   Alcohol use: Not Currently    Comment: Social   Drug use: Yes    Types: Marijuana     Allergies   Patient has no known allergies.   Review of Systems Review of Systems Per HPI  Physical Exam Triage Vital Signs ED Triage Vitals  Encounter Vitals Group     BP 04/14/23 1448 114/84     Systolic BP Percentile --      Diastolic BP Percentile --      Pulse Rate 04/14/23 1448 86     Resp 04/14/23 1448 18     Temp 04/14/23 1448 99.1 F (37.3 C)     Temp Source 04/14/23 1448 Oral     SpO2 04/14/23 1448 97 %     Weight --      Height --      Head Circumference --      Peak Flow --      Pain Score 04/14/23 1442 5     Pain Loc --      Pain Education --      Exclude from Growth Chart --    No data found.  Updated Vital Signs BP 114/84 (BP Location: Right Arm)   Pulse 86   Temp 99.1 F (37.3 C) (Oral)   Resp 18   SpO2 97%   Visual Acuity Right Eye Distance:   Left Eye Distance:   Bilateral Distance:    Right Eye Near:   Left Eye Near:    Bilateral Near:     Physical Exam Constitutional:      General: She is not in acute distress.    Appearance: Normal appearance. She is not toxic-appearing or diaphoretic.  HENT:     Head: Normocephalic and atraumatic.     Mouth/Throat:     Mouth: Mucous membranes are moist.     Pharynx: No posterior oropharyngeal erythema.  Eyes:     Extraocular Movements: Extraocular movements intact.     Conjunctiva/sclera: Conjunctivae normal.  Cardiovascular:     Rate and Rhythm: Normal rate and regular rhythm.     Pulses: Normal pulses.     Heart sounds: Normal  heart sounds.   Pulmonary:     Effort: Pulmonary effort is normal. No respiratory distress.     Breath sounds: Normal breath sounds.  Abdominal:     General: Bowel sounds are normal. There is no distension.     Palpations: Abdomen is soft.     Tenderness: There is no abdominal tenderness.  Neurological:     General: No focal deficit present.     Mental Status: She is alert and oriented to person, place, and time. Mental status is at baseline.  Psychiatric:        Mood and Affect: Mood normal.        Behavior: Behavior normal.        Thought Content: Thought content normal.        Judgment: Judgment normal.      UC Treatments / Results  Labs (all labs ordered are listed, but only abnormal results are displayed) Labs Reviewed  POC COVID19/FLU A&B COMBO - Normal    EKG   Radiology No results found.  Procedures Procedures (including critical care time)  Medications Ordered in UC Medications - No data to display  Initial Impression / Assessment and Plan / UC Course  I have reviewed the triage vital signs and the nursing notes.  Pertinent labs & imaging results that were available during my care of the patient were reviewed by me and considered in my medical decision making (see chart for details).     Patient most likely has a viral illness given known sick contacts. Covid and flu test negative. There are no signs of acute abdomen or dehydration on exam at this time.  Advised adequate fluids, rest, bland diet.  Patient offered nausea medication but declined stating that she is starting to feel better.  Advised strict follow-up precautions.  Patient verbalized understanding and was agreeable with plan. Final Clinical Impressions(s) / UC Diagnoses   Final diagnoses:  Viral illness  Nausea vomiting and diarrhea   Discharge Instructions   None    ED Prescriptions   None    PDMP not reviewed this encounter.   Gustavus Bryant, [place] 04/14/23 [phone]

## 2023-04-14 NOTE — ED Triage Notes (Signed)
 N/V/D since yesterday. Her kids have had the same symptoms

## 2023-04-16 ENCOUNTER — Encounter: Payer: Self-pay | Admitting: Physical Medicine and Rehabilitation

## 2023-04-24 ENCOUNTER — Ambulatory Visit: Admission: EM | Admit: 2023-04-24 | Discharge: 2023-04-24 | Disposition: A | Discharge status: Home / Self Care

## 2023-04-24 ENCOUNTER — Other Ambulatory Visit: Payer: Self-pay

## 2023-04-24 ENCOUNTER — Encounter: Payer: Self-pay | Admitting: *Deleted

## 2023-04-24 DIAGNOSIS — Z789 Other specified health status: Secondary | ICD-10-CM | POA: Insufficient documentation

## 2023-04-24 DIAGNOSIS — A749 Chlamydial infection, unspecified: Secondary | ICD-10-CM | POA: Insufficient documentation

## 2023-04-24 DIAGNOSIS — M25472 Effusion, left ankle: Secondary | ICD-10-CM | POA: Insufficient documentation

## 2023-04-24 DIAGNOSIS — O24419 Gestational diabetes mellitus in pregnancy, unspecified control: Secondary | ICD-10-CM | POA: Insufficient documentation

## 2023-04-24 DIAGNOSIS — O09899 Supervision of other high risk pregnancies, unspecified trimester: Secondary | ICD-10-CM | POA: Insufficient documentation

## 2023-04-24 DIAGNOSIS — R12 Heartburn: Secondary | ICD-10-CM | POA: Insufficient documentation

## 2023-04-24 DIAGNOSIS — O99019 Anemia complicating pregnancy, unspecified trimester: Secondary | ICD-10-CM | POA: Insufficient documentation

## 2023-04-24 DIAGNOSIS — J069 Acute upper respiratory infection, unspecified: Secondary | ICD-10-CM | POA: Insufficient documentation

## 2023-04-24 DIAGNOSIS — Z98891 History of uterine scar from previous surgery: Secondary | ICD-10-CM | POA: Insufficient documentation

## 2023-04-24 DIAGNOSIS — T8149XA Infection following a procedure, other surgical site, initial encounter: Secondary | ICD-10-CM | POA: Insufficient documentation

## 2023-04-24 DIAGNOSIS — Z8759 Personal history of other complications of pregnancy, childbirth and the puerperium: Secondary | ICD-10-CM | POA: Insufficient documentation

## 2023-04-24 DIAGNOSIS — B9689 Other specified bacterial agents as the cause of diseases classified elsewhere: Secondary | ICD-10-CM | POA: Insufficient documentation

## 2023-04-24 DIAGNOSIS — N926 Irregular menstruation, unspecified: Secondary | ICD-10-CM | POA: Insufficient documentation

## 2023-04-24 DIAGNOSIS — J101 Influenza due to other identified influenza virus with other respiratory manifestations: Secondary | ICD-10-CM

## 2023-04-24 DIAGNOSIS — K5900 Constipation, unspecified: Secondary | ICD-10-CM | POA: Insufficient documentation

## 2023-04-24 DIAGNOSIS — O209 Hemorrhage in early pregnancy, unspecified: Secondary | ICD-10-CM | POA: Insufficient documentation

## 2023-04-24 DIAGNOSIS — M25572 Pain in left ankle and joints of left foot: Secondary | ICD-10-CM | POA: Insufficient documentation

## 2023-04-24 DIAGNOSIS — O4690 Antepartum hemorrhage, unspecified, unspecified trimester: Secondary | ICD-10-CM | POA: Insufficient documentation

## 2023-04-24 LAB — POC COVID19/FLU A&B COMBO
Covid Antigen, POC: NEGATIVE
Influenza A Antigen, POC: POSITIVE — AB
Influenza B Antigen, POC: POSITIVE — AB

## 2023-04-24 MED ORDER — PSEUDOEPH-BROMPHEN-DM 30-2-10 MG/5ML PO SYRP: 10.0000 mL | ORAL_SOLUTION | Freq: Four times a day (QID) | ORAL | 0 refills | Status: AC | PRN

## 2023-04-24 MED ORDER — PREDNISONE 10 MG PO TABS: 20.0000 mg | ORAL_TABLET | Freq: Every day | ORAL | 0 refills | Status: AC

## 2023-04-24 MED ORDER — ALBUTEROL SULFATE HFA 108 (90 BASE) MCG/ACT IN AERS: 2.0000 | INHALATION_SPRAY | RESPIRATORY_TRACT | 0 refills | Status: AC | PRN

## 2023-04-24 MED ORDER — FLUTICASONE PROPIONATE 50 MCG/ACT NA SUSP: 2.0000 | Freq: Every day | NASAL | 0 refills | Status: AC

## 2023-04-24 MED ORDER — ACETAMINOPHEN 325 MG PO TABS
650.0000 mg | ORAL_TABLET | Freq: Once | ORAL | Status: AC
  Administered 2023-04-24: 650 mg via ORAL

## 2023-04-24 NOTE — ED Provider Notes (Signed)
 [facility]    CSN: [id number] Arrival date & time: 04/24/23  1038      History   Chief Complaint Chief Complaint  Patient presents with   Cough    HPI Christy Bell is a 33 y.o. female.   Christy Bell is a 33 y.o. female who presents for influenza like symptoms that began approximately five days ago. Her daughter recently tested positive for influenza A, and her daughter is also experiencing similar symptoms and is being evaluated today as well. The patient reports a persistent cough, difficulty catching her breath while coughing, intermittent, fevers (TMAX 101), general weakness, chills, body aches, dizziness, head congestion, runny nose, sneezing, post nasal drainage, sore throat, wheezing, and nausea. She reports that her dizziness was particularly severe this morning, to the point where she felt like she might pass out while in the shower. She denies vomiting or diarrhea. She has attempted symptom management with Motrin and NyQuil and reports being able to maintain good oral intake with adequate hydration. She had not taken any medication for her symptoms prior to arrival at the clinic today and was given Tylenol at 11:30 AM per clinic protocol.  The following portions of the patient's history were reviewed and updated as appropriate: allergies, current medications, past family history, past medical history, past social history, past surgical history, and problem list.         Past Medical History:  Diagnosis Date   Anxiety    Asthma    Bronchitis    Depression    Fatty liver    GERD (gastroesophageal reflux disease)    Gestational diabetes    Headache    Hx of chlamydia infection    Preeclampsia    Resolved after childbirth    Patient Active Problem List   Diagnosis Date Noted   Anemia of pregnancy 04/24/2023   Bacterial vaginosis 04/24/2023   Chlamydial infection 04/24/2023   Constipation 04/24/2023   Heartburn 04/24/2023    Gestational diabetes mellitus 04/24/2023   Postoperative wound infection 04/24/2023   Missed period 04/24/2023   Premature delivery 04/24/2023   Vaginal bleeding in pregnancy 04/24/2023   Upper respiratory infection 04/24/2023   Pain in left ankle and joints of left foot 04/24/2023   History of cesarean section 04/24/2023   Rubella non-immune status, antepartum 04/24/2023   Effusion, left ankle 04/24/2023   Hemorrhage in early pregnancy, unspecified 04/24/2023   History of pre-eclampsia 04/24/2023   Left ankle sprain 06/02/2022   Pain and swelling of ankle 06/02/2022   Body mass index (BMI) of 36.0-36.9 in adult 01/18/2022   Hidradenitis suppurativa 01/18/2022   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   Asthma 12/21/2021   Depression with anxiety 12/21/2021   Fatty liver 12/21/2021   Gastroesophageal reflux disease 12/21/2021   Status post repeat low transverse cesarean section 02/26/2017   Cleft lip, unilateral complete, 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, 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   Pregnancy 07/27/2016    Past Surgical History:  Procedure Laterality Date   CESAREAN SECTION N/A 09/07/2014   Procedure: CESAREAN SECTION;  Surgeon: Essie Hart, MD;  Location: [facility];  Service: Obstetrics;  Laterality: N/A;   CESAREAN SECTION N/A 02/26/2017   Procedure: REPEAT CESAREAN SECTION;  Surgeon: Essie Hart, MD;  Location: [facility] BIRTHING SUITES;  Service: Obstetrics;  Laterality: N/A;  Tracey RNFA   CHOLECYSTECTOMY N/A 10/29/2019   Procedure: LAPAROSCOPIC CHOLECYSTECTOMY;  Surgeon: Almond Lint, MD;  Location: [facility];  Service: General;  Laterality: N/A;   GALLBLADDER SURGERY  WISDOM TOOTH EXTRACTION     x1    OB History     Gravida  2   Para  1   Term      Preterm  1   AB      Living  1      SAB      IAB      Ectopic      Multiple  0   Live Births  1            Home Medications    Prior to Admission medications   Medication Sig Start Date End Date Taking? Authorizing Provider   albuterol (VENTOLIN HFA) 108 (90 Base) MCG/ACT inhaler Inhale 2 puffs into the lungs every 4 (four) hours as needed for wheezing or shortness of breath. 04/24/23  Yes Lurline Idol, FNP  brompheniramine-pseudoephedrine-DM 30-2-10 MG/5ML syrup Take 10 mLs by mouth 4 (four) times daily as needed (cough and congestion). 04/24/23  Yes Lurline Idol, FNP  fluticasone (FLONASE) 50 MCG/ACT nasal spray Place 2 sprays into both nostrils daily. 04/24/23  Yes Lurline Idol, FNP  ibuprofen (ADVIL) 200 MG tablet Take 200 mg by mouth every 6 (six) hours as needed for moderate pain.   Yes [provider]  levonorgestrel (MIRENA, 52 MG,) 20 MCG/DAY IUD 1 each by Intrauterine route once. 04/12/17  Yes [provider]  predniSONE (DELTASONE) 10 MG tablet Take 2 tablets (20 mg total) by mouth daily for 5 days. 04/24/23 04/29/23 Yes Lurline Idol, FNP  acetaminophen (TYLENOL) 500 MG tablet Take 500 mg by mouth every 6 (six) hours as needed for moderate pain.    [provider]  sertraline (ZOLOFT) 50 MG tablet Take 100 mg by mouth daily. Patient not taking: Reported on 04/14/2023    [provider]  SYMBICORT 160-4.5 MCG/ACT inhaler Inhale 2 puffs into the lungs daily as needed (sob/wheezing).  Patient not taking: Reported on 04/14/2023 09/21/19   [provider]    Family History Family History  Problem Relation Age of Onset   Healthy Mother    Mental illness Father    Hypertension Maternal Grandmother    Heart disease Maternal Grandmother    Mental illness Maternal Grandmother    Diabetes Paternal Grandmother     Social History Social History   Tobacco Use   Smoking status: Former    Current packs/day: 0.00    Types: Cigarettes    Quit date: 02/07/2014    Years since quitting: 9.2   Smokeless tobacco: Never  Vaping Use   Vaping status: Never Used  Substance Use Topics   Alcohol use: Not Currently    Comment: Social   Drug use: Yes    Types:  Marijuana     Allergies   Niacin   Review of Systems Review of Systems  Constitutional:  Positive for activity change, chills, diaphoresis, fatigue and fever.  HENT:  Positive for congestion, postnasal drip, rhinorrhea, sneezing and sore throat. Negative for ear pain and trouble swallowing.   Respiratory:  Positive for cough, shortness of breath and wheezing.   Gastrointestinal:  Positive for nausea. Negative for diarrhea and vomiting.  Musculoskeletal:  Positive for myalgias.  Neurological:  Positive for weakness, light-headedness and headaches.  All other systems reviewed and are negative.    Physical Exam Triage Vital Signs ED Triage Vitals  Encounter Vitals Group     BP 04/24/23 1058 95/68     Systolic BP Percentile --      Diastolic BP  Percentile --      Pulse Rate 04/24/23 1058 97     Resp --      Temp 04/24/23 1058 (!) 101.5 F (38.6 C)     Temp Source 04/24/23 1058 Oral     SpO2 04/24/23 1058 100 %     Weight --      Height --      Head Circumference --      Peak Flow --      Pain Score 04/24/23 1102 9     Pain Loc --      Pain Education --      Exclude from Growth Chart --    No data found.  Updated Vital Signs BP 95/68 (BP Location: Left Arm)   Pulse 97   Temp (!) 101.5 F (38.6 C) (Oral)   SpO2 100%   Visual Acuity Right Eye Distance:   Left Eye Distance:   Bilateral Distance:    Right Eye Near:   Left Eye Near:    Bilateral Near:     Physical Exam Vitals and nursing note reviewed.  Constitutional:      General: She is awake. She is not in acute distress.    Appearance: Normal appearance. She is well-developed. She is not ill-appearing, toxic-appearing or diaphoretic.  HENT:     Head: Normocephalic.     Right Ear: Tympanic membrane, ear canal and external ear normal.     Left Ear: Tympanic membrane, ear canal and external ear normal.     Nose: Congestion present.     Mouth/Throat:     Mouth: Mucous membranes are moist.     Pharynx: No  oropharyngeal exudate or posterior oropharyngeal erythema.  Eyes:     Conjunctiva/sclera: Conjunctivae normal.     Pupils: Pupils are equal, round, and reactive to light.  Cardiovascular:     Rate and Rhythm: Normal rate.  Pulmonary:     Effort: Pulmonary effort is normal. No respiratory distress.     Breath sounds: Normal breath sounds. No wheezing.  Musculoskeletal:        General: Normal range of motion.     Cervical back: Normal range of motion and neck supple.  Lymphadenopathy:     Cervical: No cervical adenopathy.  Skin:    General: Skin is warm and dry.  Neurological:     General: No focal deficit present.     Mental Status: She is alert and oriented to person, place, and time.  Psychiatric:        Mood and Affect: Mood normal.        Behavior: Behavior normal.      UC Treatments / Results  Labs (all labs ordered are listed, but only abnormal results are displayed) Labs Reviewed  POC COVID19/FLU A&B COMBO - Abnormal; Notable for the following components:      Result Value   Influenza B Antigen, POC Positive (*)    Influenza A Antigen, POC Positive (*)    All other components within normal limits    EKG   Radiology No results found.  Procedures Procedures (including critical care time)  Medications Ordered in UC Medications  acetaminophen (TYLENOL) tablet 650 mg (650 mg Oral Given 04/24/23 1134)    Initial Impression / Assessment and Plan / UC Course  I have reviewed the triage vital signs and the nursing notes.  Pertinent labs & imaging results that were available during my care of the patient were reviewed by me and considered in my  medical decision making (see chart for details).   33 year old female presenting with a five-day history of influenza-like symptoms and known household exposure to influenza A. She is afebrile at 101.5 but nontoxic appearing and in no acute distress. Rapid test confirms positivity for both influenza A & B. Given that she is  outside the treatment window for Tamiflu, supportive care was recommended, including adequate hydration, rest, and symptomatic management. She should follow-up with her primary care provider if symptoms fail to improve or go to the ED if worsening symptoms occur.    Final Clinical Impressions(s) / UC Diagnoses   Final diagnoses:  Influenza A  Influenza B     Discharge Instructions      You have the flu. Influenza (flu) is a viral infection that mainly affects the respiratory tract. This includes the lungs, nose, and throat. The flu spreads easily from person to person. Antibiotic medicines are not prescribed for viral infections.This is because antibiotics are designed to kill bacteria. They do not kill viruses. Symptoms of the flu usually begin suddenly and can last 4-14 days. Take medications as prescribed. Drink plenty of fluids and get lots of rest. Do not leave home until you do not have a fever for 24 hours without taking fever reducing medicines like tylenol or ibuprofen.   Go to the ED immediately if you get worse or have any other symptoms.       ED Prescriptions     Medication Sig Dispense Auth. Provider   brompheniramine-pseudoephedrine-DM 30-2-10 MG/5ML syrup Take 10 mLs by mouth 4 (four) times daily as needed (cough and congestion). 120 mL Lurline Idol, FNP   predniSONE (DELTASONE) 10 MG tablet Take 2 tablets (20 mg total) by mouth daily for 5 days. 10 tablet Lurline Idol, FNP   albuterol (VENTOLIN HFA) 108 (90 Base) MCG/ACT inhaler Inhale 2 puffs into the lungs every 4 (four) hours as needed for wheezing or shortness of breath. 1 each Lurline Idol, FNP   fluticasone (FLONASE) 50 MCG/ACT nasal spray Place 2 sprays into both nostrils daily. 9.9 mL Lurline Idol, FNP      PDMP not reviewed this encounter.   Lurline Idol, [place] 04/29/23 1447

## 2023-04-24 NOTE — ED Triage Notes (Signed)
 Pt reports she has been feeling since Saturday- cough, headache, "stomach hurts", "light headed". States she took a shower pta and "almost passed out". Oldest daughter had the flu last week but is better now. Tempn 101.5 in triage

## 2023-04-24 NOTE — Discharge Instructions (Addendum)
You have the flu. Influenza (flu) is a viral infection that mainly affects the respiratory tract. This includes the lungs, nose, and throat. The flu spreads easily from person to person. Antibiotic medicines are not prescribed for viral infections.This is because antibiotics are designed to kill bacteria. They do not kill viruses. Symptoms of the flu usually begin suddenly and can last 4-14 days. Take medications as prescribed. Drink plenty of fluids and get lots of rest. Do not leave home until you do not have a fever for 24 hours without taking fever reducing medicines like tylenol or ibuprofen. Go to the ED immediately if you get worse or have any other symptoms.    

## 2023-06-04 ENCOUNTER — Ambulatory Visit: Admission: EM | Admit: 2023-06-04 | Discharge: 2023-06-04 | Disposition: A | Discharge status: Home / Self Care

## 2023-06-04 ENCOUNTER — Encounter: Payer: Self-pay | Admitting: Emergency Medicine

## 2023-06-04 NOTE — ED Provider Notes (Signed)
 Patient with a exposure to a needlestick at her place of employment at [employer]  Seeking postexposure workup and possible treatment.  Patient sent to [employer] [facility] for Worker's Comp. management.   Christy Carmine, NP 06/04/23 [phone]

## 2023-06-04 NOTE — ED Triage Notes (Addendum)
 Pt presents with a needle stick by used needle from [employer]. Needle belonged to unidentified person. Pt was advised to seek medical attention. Last Tetanus shot in 2019.

## 2023-10-24 ENCOUNTER — Ambulatory Visit: Payer: Self-pay

## 2023-10-24 NOTE — Telephone Encounter (Signed)
 FYI Only or Action Required?: FYI only for provider.  Patient was last seen in primary care on 10/29/2022 by Tanda Bleacher, MD.  Called Nurse Triage reporting Anxiety, Shaking, Shortness of Breath, Headache, Depression, Cough, and Panic Attack.  Symptoms began worsening for past year.  Interventions attempted: Rest, hydration, or home remedies and Other: deep breathing, has tried prescribed anxiety meds in past, music, support from family, has tried counseling in past.  Symptoms are: gradually worsening.  Triage Disposition: See Today in Office (overriding See PCP When Office is Open (Within 3 Days))  Patient/caregiver understands and will follow disposition?: Yes     Copied from CRM #[id number]. Topic: Clinical - Red Word Triage >> Oct 24, 2023  3:46 PM Winona R wrote: Pt feels like anxiety has become worst over the past year and is interested in an appointment asap. Things at home has been taking a roll on her mental health Reason for Disposition  MODERATE anxiety (e.g., persistent or frequent anxiety symptoms; interferes with sleep, school, or work)  Answer Assessment - Initial Assessment Questions Advised pt see doc shortly for symptoms, advised pt go to ED if feeling severe symptoms again, advised call back if worsening SOB or other symptoms. No PCP availability, scheduled with other office in region for tomorrow, advised follow up with PCP after.    1. CONCERN: Did anything happen that prompted you to call today?      This morning me and my mom kinda got into it, been having trouble getting kids up in morning, oldest daughter been giving me hard time, have to pick her up out of bed to get her up, mom saying I've been too harsh Dealt with this for years, meds on and off Moved back to [place] 1-2 years ago, ran out of meds and ended up not making time to make appt to get back on it, just kind of gotten worse and worse Beginning of year both grandparents died within 2 weeks of  each other, grandparents I lived with, really hard Just been really tough, been trying to manage it Also deal with depression, think caused by anxiety 2. ANXIETY SYMPTOMS: Can you describe how you (your loved one; patient) have been feeling? (e.g., tense, restless, panicky, anxious, keyed up, overwhelmed, sense of impending doom).      Hands getting shaky and get really SOB Back in August really bad anxiety attack, dropped birthday cake, hyperventilating, shaking really bad, just kind gotten worse Have asthma so other than that have SOB if congested Feel like little stopped at moment but other than that not really Coughed on phone A little bit coughing fits but not real bad Last noticed SOB probably Sunday, lot of walking, do get little out of breath with exertion as normal, just have to stop catch breath then be fine Have rescue inhaler but not needed to use that lately 3. ONSET: How long have you been feeling this way? (e.g., hours, days, weeks)     Past year but gotten worse lately 4. SEVERITY: How would you rate the level of anxiety? (e.g., 0 - 10; or mild, moderate, severe).     Probably say 8-9/10 5. FUNCTIONAL IMPAIRMENT: How have these feelings affected your ability to do daily activities? Have you had more difficulty than usual doing your normal daily activities? (e.g., getting better, same, worse; self-care, school, work, interactions)     Yeah, right now my house looks like tornado came through it, gives me even more anxiety Dealing with depression  as well, want to do something about it but mind and body feel stuck and get tired mentally emotionally physically get exhausted When think about all the things I have to do, my mind just keeps going and going 6. HISTORY: Have you felt this way before? Have you ever been diagnosed with an anxiety problem in the past? (e.g., generalized anxiety disorder, panic attacks, PTSD). If Yes, ask: How was this problem treated? (e.g.,  medicines, counseling, etc.)     Meds in past, put me on zoloft  on and off, would help sometimes, upping dose would help little bit, but never felt like really helped me that much Tried other meds as well but feel like not really found that helped the best 7. RISK OF HARM - SUICIDAL IDEATION: Do you ever have thoughts of hurting or killing yourself? If Yes, ask:  Do you have these feelings now? Do you have a plan on how you would do this?     No I'm not 8. TREATMENT:  What has been done so far to treat this anxiety? (e.g., medicines, relaxation strategies). What has helped?     Sometimes when starting to get really angry or frustrated or flustered, try to take some deep breaths close my eyes, when doesn't work, play music really loud, music really helps, seen counselor before [place] a [place] one been trying to find one but lot going on for the past year 11. PATIENT SUPPORT: Who is with you now? Who do you live with? Do you have family or friends who you can talk to?        Live with my parents because single mom, parents help with my kids and financial aspects, do talk to parents about stuff, older cousin and sister live nearby, very good support system with family and church family But feel like it's starting to affect my family like my kids and stuff like that 53. OTHER SYMPTOMS: Do you have any other symptoms? (e.g., feeling depressed, trouble concentrating, trouble sleeping, trouble breathing, palpitations or fast heartbeat, chest pain, sweating, nausea, or diarrhea)       Very rarely when get very flustered does my chest hurt My head will hurt a lot of the time, if at my limit, with kids fighting and fussing, get headaches Only time that I've noticed about high BP was after oldest, preeclampsia, had trouble getting BP down then but since then been good No sweating, nausea, diarrhea, vomiting Sometimes when I get anxious anxious my hands will  shake but if I get super super bad through the roof, feels like pins and needles all over my body and can get physically painful Try not to let myself get to that point 13. PREGNANCY: Is there any chance you are pregnant? When was your last menstrual period?       Not have IUD and not been with anyone  Protocols used: Anxiety and Panic Attack-A-AH

## 2023-10-25 ENCOUNTER — Ambulatory Visit: Payer: Self-pay | Admitting: Nurse Practitioner

## 2023-10-25 ENCOUNTER — Encounter: Payer: Self-pay | Admitting: Nurse Practitioner

## 2023-10-25 VITALS — BP 113/61 | HR 85 | Temp 97.5°F | Wt 225.0 lb

## 2023-10-25 DIAGNOSIS — F32A Depression, unspecified: Secondary | ICD-10-CM | POA: Diagnosis not present

## 2023-10-25 DIAGNOSIS — F419 Anxiety disorder, unspecified: Secondary | ICD-10-CM | POA: Diagnosis not present

## 2023-10-25 MED ORDER — DULOXETINE HCL 30 MG PO CPEP: 30.0000 mg | ORAL_CAPSULE | Freq: Every day | ORAL | 1 refills | Status: DC

## 2023-10-25 NOTE — Progress Notes (Signed)
 Acute Office Visit  Subjective:     Patient ID: Christy Bell, female    DOB: 13-Jan-1991, 33 y.o.   MRN: [id number]  No chief complaint on file.   Discussed the use of AI scribe software for clinical note transcription with the patient, who gave verbal consent to proceed.  History of Present Illness Christy Bell is a 33 year old female   has a past medical history of Anxiety, Asthma, Bronchitis, Depression, Fatty liver, GERD (gastroesophageal reflux disease), Gestational diabetes, Headache, chlamydia infection, and Preeclampsia. who presents complains of anxiety and depression   She has a long-standing history of anxiety and depression, which have intensified over the past year after moving back to [facility] and discontinuing her medications. She was previously on Zoloft  and an antipsychotic, but she does not recall the name of the latter. Zoloft  was ineffective, and she experienced severe epistaxis with Prozac and headaches with Amplify. She has not been on any medication since moving back and has not seen a therapist since then.  Her symptoms significantly impact her daily life, including difficulty focusing at work and home, neglecting personal hygiene, and strained relationships with her parents. Her ADD symptoms have worsened, making it challenging to complete tasks such as cleaning her house.  Her family history includes relatives with ADHD, including her father, uncle, and cousin, who, according to the patient, were put on medication and have had a lot of success with it. She has not been formally tested or treated for ADHD herself.  Socially, she lives with her parents, who assist with her children and financial matters. She previously used marijuana to manage her symptoms but stopped as it worsened her condition. She denies any current substance use.  No fever, chills, chest pain, shortness of breath, nausea, vomiting, or current suicidal ideation. She admits to past  feelings of wanting to 'quit' but not to the extent of taking action. Assessment and Plan Assessment & Plan     Review of Systems  Constitutional:  Negative for appetite change, chills, fatigue and fever.  HENT:  Negative for congestion, postnasal drip, rhinorrhea and sneezing.   Respiratory:  Negative for cough, shortness of breath and wheezing.   Cardiovascular:  Negative for chest pain, palpitations and leg swelling.  Gastrointestinal:  Negative for abdominal pain, constipation, nausea and vomiting.  Genitourinary:  Negative for difficulty urinating, dysuria, flank pain and frequency.  Musculoskeletal:  Negative for arthralgias, back pain, joint swelling and myalgias.  Skin:  Negative for color change, pallor, rash and wound.  Neurological:  Negative for dizziness, facial asymmetry, weakness, numbness and headaches.  Psychiatric/Behavioral:  Negative for behavioral problems, confusion, self-injury and suicidal ideas.         Objective:    BP 113/61   Pulse 85   Temp (!) 97.5 F (36.4 C)   Wt 225 lb (102.1 kg)   SpO2 100%   BMI 39.86 kg/m    Physical Exam Vitals and nursing note reviewed.  Constitutional:      General: She is not in acute distress.    Appearance: Normal appearance. She is obese. She is not ill-appearing, toxic-appearing or diaphoretic.  Eyes:     General: No scleral icterus.       Right eye: No discharge.        Left eye: No discharge.     Extraocular Movements: Extraocular movements intact.     Conjunctiva/sclera: Conjunctivae normal.  Cardiovascular:     Rate and Rhythm: Normal rate and regular rhythm.  Pulses: Normal pulses.     Heart sounds: Normal heart sounds. No murmur heard.    No friction rub. No gallop.  Pulmonary:     Effort: Pulmonary effort is normal. No respiratory distress.     Breath sounds: Normal breath sounds. No stridor. No wheezing, rhonchi or rales.  Chest:     Chest wall: No tenderness.  Abdominal:     General: There  is no distension.     Palpations: Abdomen is soft.     Tenderness: There is no abdominal tenderness. There is no right CVA tenderness, left CVA tenderness or guarding.  Musculoskeletal:        General: No swelling, tenderness, deformity or signs of injury.     Right lower leg: No edema.     Left lower leg: No edema.  Skin:    General: Skin is warm and dry.     Capillary Refill: Capillary refill takes less than 2 seconds.     Coloration: Skin is not jaundiced or pale.     Findings: No bruising, erythema or lesion.  Neurological:     Mental Status: She is alert and oriented to person, place, and time.     Motor: No weakness.     Gait: Gait normal.  Psychiatric:        Mood and Affect: Mood normal.        Behavior: Behavior normal.        Thought Content: Thought content normal.        Judgment: Judgment normal.     No results found for any visits on 10/25/23.      Assessment & Plan:   Problem List Items Addressed This Visit       Other   Anxiety and depression - Primary   Major depressive disorder and generalized anxiety disorder Exacerbated by recent life events. Previous medications ineffective or caused adverse effects. Duloxetine  chosen for dual action on depression and anxiety. - Start duloxetine  30 mg daily, increase to 60 mg daily after one week if tolerated. - Educated on potential side effects of duloxetine , including stomach upset and sexual dysfunction. - Refer to counseling for additional support.  A list of therapist in this area provided - Provided information on urgent care clinics for mental health support. Written materials on anxiety and depression provided  Has  Family history of successful treatment with medication for ADHD. - Refer for ADHD testing. - Provided information on psychological centers for ADHD testing.      Relevant Medications   DULoxetine  (CYMBALTA ) 30 MG capsule    Meds ordered this encounter  Medications   DULoxetine  (CYMBALTA )  30 MG capsule    Sig: Take 1 capsule (30 mg total) by mouth daily.    Dispense:  60 capsule    Refill:  1    Return in about 6 weeks (around 12/06/2023) for ANXIETY, DEPRESSION.  Davion Meara R Yuritzi Kamp, FNP

## 2023-10-25 NOTE — Assessment & Plan Note (Addendum)
 Major depressive disorder and generalized anxiety disorder Exacerbated by recent life events. Previous medications ineffective or caused adverse effects. Duloxetine  chosen for dual action on depression and anxiety. - Start duloxetine  30 mg daily, increase to 60 mg daily after one week if tolerated. - Educated on potential side effects of duloxetine , including stomach upset and sexual dysfunction. - Refer to counseling for additional support.  A list of therapist in this area provided - Provided information on urgent care clinics for mental health support. Written materials on anxiety and depression provided  Has  Family history of successful treatment with medication for ADHD. - Refer for ADHD testing. - Provided information on psychological centers for ADHD testing.

## 2023-10-25 NOTE — Patient Instructions (Signed)
 To be tested for ADHD please call the office below to schedule an appointment [facility] Address: [address] [place], [place], [place] [address] Phone: [phone]  Please start taking duloxetine  30 mg daily after 1 week increase to 60 mg daily for anxiety and depression  .Behavioral Health Resources:    What if I or someone I know is in crisis?   If you are thinking about harming yourself or having thoughts of suicide, or if you know someone who is, seek help right away.   Call your doctor or mental health care provider.   Call 911 or go to a hospital emergency room to get immediate help, or ask a friend or family member to help you do these things; IF YOU ARE IN [place], YOU MAY GO TO WALK-IN URGENT CARE 24/7 at [facility] (see below)   Call the [place]  National Suicide Prevention Lifeline's toll-free, 24-hour hotline at 1-800-273-TALK 470-128-6998) or TTY: 1-800-799-4 TTY (757)236-2595) to talk to a trained counselor.   If you are in crisis, make sure you are not left alone.    If someone else is in crisis, make sure he or she is not left alone     24 Hour :    [facility]  [address], [place], [place] [address] [phone] or [phone] WALK-IN URGENT CARE 24/7   [facility] Mobile Crisis: [phone])333-6676   [place]  National Suicide Hotline: (818)342-0194   [facility] the [employer] (Domestic Violence, Rape & Victim Assistance)  [phone]   [facility]  [address], [place]  [address]   [phone] or ([phone]    [facility] [employer] [facility]: [phone] (8am-4pm) or [phone] (after hours)          [facility], [address] [place], [place]  [phone] Fax: [phone] [email or web address] *Interpreters available *Accepts all insurance and uninsured for  Urgent Care needs *Accepts Medicaid and uninsured for outpatient treatment    [facility]   Mon-Fri: 8am-5pm [address], [place], [place] [phone](eynwz); [phone](fax) [email or web address]  *Accepts Medicare   [facility] [place], Fri: 8am-4pm [address], [place], [place] [address] [phone] (phone); [phone] (fax) [email or web address]  *Accepts Medicare   [facility] Mon-Fri: 9am-5pm  [address] [place], [place] [phone] (phone); [phone]  [email or web address]  *Accepts Medicaid   [facility] [place], 8:30am-12pm/1pm-2:30pm [address], [place], [place] [phone] (phone); [phone] (fax) [email or web address].[email or web address]  *Accepts Medicaid, sliding-scale*Bilingual services available   [facility] Mon-Fri, 8am-7pm [address] [place], [place]  [phone](eynwz); [phone](fax) [email or web address].[email or web address]  *Accepts Medicaid *Bilingual services available   [facility] Mon-Fri: 8am-5pm, Saturday by appointment only [address] [place], [place], [place] [phone] (phone); [phone] (fax) [email or web address]    [facility] [address], [place], [place] [phone] [email or web address]  *Free & reduced services for uninsured and underinsured individuals *Bilingual services for Spanish-speaking clients 21 and under   [facility], [address], [place], [place] [phone](eynwz); [phone](fax) [email or web address]  *Bring your own interpreter at first visit *Accepts Medicare and [facility]   [facility] Mon-Fri: 9am-5:30pm [address], [place], [place] [phone] (phone), [phone] (fax) After hours crisis line:  [phone] [email or web address]  *Accepts Medicare and Medicaid   [employer], 8am-6pm [address] [place], [place]  [phone] (phone);  [phone] (fax) [email or web address]  *Subsidized costs available   [facility] Mon-Fri: 8am-4pm [address], [place], [place] [phone](eynwz); [phone](fax) [email or web address]  *Accepts Medicaid   [facility] Same day access hours: Mon-Fri, 8:30-3pm Crisis hours: Mon-Fri, 8am-5pm [address] [place], [place] [phone]   [facility] Same day access hours: Mon-Fri, 8:30-3pm Crisis hours: Mon-Fri, 8am-8pm [address] [place], [place] [phone] (phone); [phone] (fax) [email or web address].[email or web address]  *Accepts Medicaid and Medicare   [facility], [place], Fri: 9am-9pm Tues, Thurs: 9am-6pm [address] [place], [place], [place]  [phone] (phone); [phone] (fax) [email or web address]  *(Accepts Medicare and Medicaid; payment plans available)*Bilingual services available   [facility] [address] [place], [place] [phone] (phone); [phone] (fax) [email or web address]    [facility] [address], [place], [place]  [phone]  [email or web address]  *Bilingual services available   [facility]) Mon-Thurs: 8am-8pm [address], [place], [place] [phone] (phone); [phone] (fax) [email or web address]  *Accepts Medicaid*Bilingual services available   [facility] [address] [place], [place] [phone] (phone) [email or web address]  *Accepts Medicaid *Bilingual services available   [facility] Mon-Fri, 9am-4:45pm [address] [place], [place] [phone] (phone); [phone]  (fax) [email or web address]  *Accepts Medicare   [facility] Mon-Thurs: 8:30-8pm, Fri: 8:30am-7pm [address] [place], [place] (3rd floor) [phone] (phone); [phone] (fax) [email or web address]  *Accepts Medicaid; income-based reduced rates available   [facility] Mon-Fri: 8am-5pm [address], [place], [place] [address] [phone] (phone); [phone] (fax) [email or web address]  *Accepts Medicaid*Bilingual services available     [facility])  [address], [place] [phone] [email or web address]  *Provides direct services to individuals in recovery from mental illness, including support groups, recovery skills classes, and one on one peer support   [employer] on Mental Illness) [place] helpline: (530)037-6075  [facility] [place] helpline: 239-053-4263 [email or web address]://[email or web address]  *A community hub for information relating to local resources and services for the friends and families of individuals living alongside a mental health condition, as well as the individuals themselves. Classes and support groups also provided

## 2023-12-09 ENCOUNTER — Encounter: Payer: Self-pay | Admitting: Radiology

## 2023-12-09 ENCOUNTER — Encounter: Payer: Self-pay | Admitting: Emergency Medicine

## 2023-12-09 ENCOUNTER — Ambulatory Visit: Admission: EM | Admit: 2023-12-09 | Discharge: 2023-12-09 | Disposition: A | Discharge status: Home / Self Care

## 2023-12-09 DIAGNOSIS — J0140 Acute pansinusitis, unspecified: Secondary | ICD-10-CM | POA: Diagnosis not present

## 2023-12-09 DIAGNOSIS — F418 Other specified anxiety disorders: Secondary | ICD-10-CM | POA: Diagnosis not present

## 2023-12-09 MED ORDER — AMOXICILLIN-POT CLAVULANATE 875-125 MG PO TABS: 1.0000 | ORAL_TABLET | Freq: Two times a day (BID) | ORAL | 0 refills | Status: AC

## 2023-12-09 MED ORDER — PREDNISONE 20 MG PO TABS: 40.0000 mg | ORAL_TABLET | Freq: Every day | ORAL | 0 refills | Status: AC

## 2023-12-09 MED ORDER — AZELASTINE HCL 0.1 % NA SOLN: 1.0000 | Freq: Two times a day (BID) | NASAL | 1 refills | Status: AC

## 2023-12-09 NOTE — ED Triage Notes (Signed)
 Pt reports sinus pressure and headaches x1.5 weeks. Has experienced sore throat, productive-dry cough (green to yellow sputum), and nasal congestion throughout this period intermittently. Denies fevers and chills. She took 2 covid home tests with last negative yesterday. Pt has taken albuterol  inhaler and tylenol  with no relief of symptoms. Pt reports she is prone to sinus infections with seasonal changes.

## 2023-12-09 NOTE — ED Provider Notes (Signed)
 [facility]  Note:  This document was prepared using Conservation officer, historic buildings and may include unintentional dictation errors.  MRN: [id number] DOB: [birth year]  Subjective:   Christy Bell is a 33 y.o. female presenting for sinus pressure in the maxillary and frontal sinuses, headache mild sore throat, productive dry cough x 1-1/2 weeks.  Patient reports that symptoms initially started as mild cough, sore throat, nasal congestion but over the last couple of days has developed more facial pressure and pain including frontal headache.  Patient has been using some over-the-counter medication with minimal improvement to symptoms.  Took 2 home COVID test which were both negative yesterday.  Patient denies any fever, severe body aches, weakness, dizziness.  Patient reports that she is prone to sinus infections usually during fall and spring when seasons change.  No current facility-administered medications for this encounter.  Current Outpatient Medications:    acetaminophen  (TYLENOL ) 500 MG tablet, Take 500 mg by mouth every 6 (six) hours as needed for moderate pain (pain score 4-6)., Disp: , Rfl:    albuterol  (VENTOLIN  HFA) 108 (90 Base) MCG/ACT inhaler, Inhale 2 puffs into the lungs every 4 (four) hours as needed for wheezing or shortness of breath., Disp: 1 each, Rfl: 0   amoxicillin -clavulanate (AUGMENTIN ) 875-125 MG tablet, Take 1 tablet by mouth every 12 (twelve) hours., Disp: 14 tablet, Rfl: 0   azelastine (ASTELIN) 0.1 % nasal spray, Place 1 spray into both nostrils 2 (two) times daily. Use in each nostril as directed, Disp: 30 mL, Rfl: 1   DULoxetine  (CYMBALTA ) 30 MG capsule, Take 1 capsule (30 mg total) by mouth daily., Disp: 60 capsule, Rfl: 1   levonorgestrel (MIRENA, 52 MG,) 20 MCG/DAY IUD, 1 each by Intrauterine route once., Disp: , Rfl:    predniSONE  (DELTASONE ) 20 MG tablet, Take 2 tablets (40 mg total) by mouth daily for 5 days., Disp: 10 tablet, Rfl: 0    brompheniramine-pseudoephedrine-DM 30-2-10 MG/5ML syrup, Take 10 mLs by mouth 4 (four) times daily as needed (cough and congestion). (Patient not taking: Reported on 10/25/2023), Disp: 120 mL, Rfl: 0   docusate sodium  (COLACE) 100 MG capsule, Take 1 capsule twice a day by oral route as needed. (Patient not taking: Reported on 12/09/2023), Disp: , Rfl:    doxycycline  (VIBRAMYCIN ) 100 MG capsule, Take 100 mg by mouth. (Patient not taking: Reported on 12/09/2023), Disp: , Rfl:    famotidine  (PEPCID ) 40 MG tablet, Take 20 mg by mouth. (Patient not taking: Reported on 12/09/2023), Disp: , Rfl:    FLUoxetine (PROZAC) 20 MG capsule, TAKE 1 CAPSULE BY MOUTH ONCE DAILY AT NIGHT (Patient not taking: Reported on 12/09/2023), Disp: , Rfl:    FLUoxetine (PROZAC) 40 MG capsule, TAKE 1 CAPSULE BY MOUTH ONCE DAILY AT NIGHT (Patient not taking: Reported on 12/09/2023), Disp: , Rfl:    fluticasone  (FLONASE ) 50 MCG/ACT nasal spray, Place 2 sprays into both nostrils daily. (Patient not taking: Reported on 10/25/2023), Disp: 9.9 mL, Rfl: 0   hydrOXYzine (ATARAX) 10 MG tablet, Take 1 tablet by mouth daily as needed., Disp: , Rfl:    ibuprofen  (ADVIL ) 200 MG tablet, Take 200 mg by mouth every 6 (six) hours as needed for moderate pain. (Patient not taking: No sig reported), Disp: , Rfl:    Lurasidone HCl (LATUDA) 60 MG TABS, Take 60 mg by mouth. (Patient not taking: Reported on 12/09/2023), Disp: , Rfl:    sertraline  (ZOLOFT ) 100 MG tablet, Take 100 mg by mouth daily. (Patient not taking:  Reported on 12/09/2023), Disp: , Rfl:    SYMBICORT 160-4.5 MCG/ACT inhaler, Inhale 2 puffs into the lungs daily as needed (sob/wheezing).  (Patient not taking: Reported on 10/25/2023), Disp: , Rfl:    No Known Allergies  Past Medical History:  Diagnosis Date   Anxiety    Asthma    Bronchitis    Depression    Fatty liver    GERD (gastroesophageal reflux disease)    Gestational diabetes    Headache    Hx of chlamydia infection     Preeclampsia    Resolved after childbirth     Past Surgical History:  Procedure Laterality Date   CESAREAN SECTION N/A 09/07/2014   Procedure: CESAREAN SECTION;  Surgeon: Gigi Botts, MD;  Location: [facility];  Service: Obstetrics;  Laterality: N/A;   CESAREAN SECTION N/A 02/26/2017   Procedure: REPEAT CESAREAN SECTION;  Surgeon: Botts Gigi, MD;  Location: [facility];  Service: Obstetrics;  Laterality: N/A;  Tracey RNFA   CHOLECYSTECTOMY N/A 10/29/2019   Procedure: LAPAROSCOPIC CHOLECYSTECTOMY;  Surgeon: Aron Shoulders, MD;  Location: [facility];  Service: General;  Laterality: N/A;   GALLBLADDER SURGERY     WISDOM TOOTH EXTRACTION     x1    Family History  Problem Relation Age of Onset   Healthy Mother    Mental illness Father    Hypertension Maternal Grandmother    Heart disease Maternal Grandmother    Mental illness Maternal Grandmother    Diabetes Paternal Grandmother     Social History   Tobacco Use   Smoking status: Former    Current packs/day: 0.00    Types: Cigarettes    Quit date: 02/07/2014    Years since quitting: 9.8   Smokeless tobacco: Never  Vaping Use   Vaping status: Never Used  Substance Use Topics   Alcohol use: Not Currently    Comment: Social   Drug use: Not Currently    Types: Marijuana    ROS Refer to HPI for ROS details.  Objective:   Vitals: BP 103/66 (BP Location: Left Arm)   Pulse 80   Temp 98.6 F (37 C) (Oral)   Resp 18   LMP 12/04/2023 (Approximate)   SpO2 98%   Physical Exam Vitals and nursing note reviewed.  Constitutional:      General: She is not in acute distress.    Appearance: She is well-developed. She is not ill-appearing or toxic-appearing.  HENT:     Head: Normocephalic and atraumatic.     Nose: Nasal tenderness, mucosal edema, congestion and rhinorrhea present.     Right Turbinates: Swollen.     Left Turbinates: Swollen.     Right Sinus: Maxillary sinus tenderness and frontal sinus tenderness present.     Left  Sinus: Maxillary sinus tenderness and frontal sinus tenderness present.     Mouth/Throat:     Mouth: Mucous membranes are moist.     Pharynx: Oropharynx is clear. No oropharyngeal exudate or posterior oropharyngeal erythema.  Eyes:     Extraocular Movements: Extraocular movements intact.     Conjunctiva/sclera: Conjunctivae normal.  Cardiovascular:     Rate and Rhythm: Normal rate.  Pulmonary:     Effort: Pulmonary effort is normal. No respiratory distress.     Breath sounds: No stridor. No wheezing.  Musculoskeletal:     Cervical back: Normal range of motion and neck supple. Tenderness present. No rigidity.  Skin:    General: Skin is warm and dry.  Neurological:  General: No focal deficit present.     Mental Status: She is alert and oriented to person, place, and time.  Psychiatric:        Mood and Affect: Mood normal.        Behavior: Behavior normal.     Procedures  No results found for this or any previous visit (from the past 24 hours).  No results found.   Assessment and Plan :     Discharge Instructions       1. Acute non-recurrent pansinusitis (Primary) - azelastine (ASTELIN) 0.1 % nasal spray; Place 1 spray into both nostrils 2 (two) times daily. Use in each nostril as directed  Dispense: 30 mL; Refill: 1 - predniSONE  (DELTASONE ) 20 MG tablet; Take 2 tablets (40 mg total) by mouth daily for 5 days.  Dispense: 10 tablet; Refill: 0 - amoxicillin -clavulanate (AUGMENTIN ) 875-125 MG tablet; Take 1 tablet by mouth every 12 (twelve) hours.  Dispense: 14 tablet; Refill: 0 -Continue to monitor symptoms for any change in severity if there is any escalation of current symptoms or development of new symptoms follow-up in ER for further evaluation and management.       Candee Hoon B Osmel Dykstra   Kynzlie Hilleary, [place], [place] 12/09/23 1411

## 2023-12-09 NOTE — Discharge Instructions (Signed)
  1. Acute non-recurrent pansinusitis (Primary) - azelastine (ASTELIN) 0.1 % nasal spray; Place 1 spray into both nostrils 2 (two) times daily. Use in each nostril as directed  Dispense: 30 mL; Refill: 1 - predniSONE  (DELTASONE ) 20 MG tablet; Take 2 tablets (40 mg total) by mouth daily for 5 days.  Dispense: 10 tablet; Refill: 0 - amoxicillin -clavulanate (AUGMENTIN ) 875-125 MG tablet; Take 1 tablet by mouth every 12 (twelve) hours.  Dispense: 14 tablet; Refill: 0 -Continue to monitor symptoms for any change in severity if there is any escalation of current symptoms or development of new symptoms follow-up in ER for further evaluation and management.

## 2023-12-10 ENCOUNTER — Telehealth: Payer: Self-pay

## 2023-12-10 ENCOUNTER — Other Ambulatory Visit: Payer: Self-pay | Admitting: Nurse Practitioner

## 2023-12-10 ENCOUNTER — Telehealth: Payer: Self-pay | Admitting: Family Medicine

## 2023-12-10 DIAGNOSIS — F32A Depression, unspecified: Secondary | ICD-10-CM

## 2023-12-10 MED ORDER — DULOXETINE HCL 60 MG PO CPEP: 60.0000 mg | ORAL_CAPSULE | Freq: Every day | ORAL | 1 refills | Status: AC

## 2023-12-10 NOTE — Telephone Encounter (Signed)
 Pt was advised [facility]

## 2023-12-10 NOTE — Telephone Encounter (Signed)
 Called pt and could not hear if pt answered .  Will call again later. KH

## 2023-12-10 NOTE — Telephone Encounter (Signed)
 Copied from CRM [id number]. Topic: General - Other >> Dec 10, 2023  2:22 PM Fonda T wrote: Reason for CRM: Patient states she is returning missed call to PCP office.  Called and spoke to front office, advised to send clinical CRM, as was unavailable at time of call.   Patient can be reached at [phone].

## 2023-12-10 NOTE — Telephone Encounter (Signed)
 Copied from CRM [id number]. Topic: Clinical - Prescription Issue >> Dec 09, 2023 10:32 AM Amy B wrote: Reason for CRM: Patient states she was supposed to get an increased dosage of Duloxetine  (CYMBALTA ) 30 MG capsule to 60 mg.  She states pharmacy told her she had to call in for a refill of the increased dose.  Patient states she is completely out of medication.

## 2024-03-11 ENCOUNTER — Ambulatory Visit
Admission: EM | Admit: 2024-03-11 | Discharge: 2024-03-11 | Disposition: A | Discharge status: Home / Self Care | Source: Home / Self Care

## 2024-03-11 ENCOUNTER — Ambulatory Visit

## 2024-03-11 ENCOUNTER — Encounter: Payer: Self-pay | Admitting: Emergency Medicine

## 2024-03-11 DIAGNOSIS — N3001 Acute cystitis with hematuria: Secondary | ICD-10-CM

## 2024-03-11 LAB — POCT URINE DIPSTICK
Bilirubin, UA: NEGATIVE
Glucose, UA: NEGATIVE mg/dL
Nitrite, UA: NEGATIVE
POC PROTEIN,UA: 100 — AB
Spec Grav, UA: >=1.03 — AB
Urobilinogen, UA: 1 U/dL
pH, UA: 6

## 2024-03-11 MED ORDER — CEFUROXIME AXETIL 250 MG PO TABS: 250.0000 mg | ORAL_TABLET | Freq: Two times a day (BID) | ORAL | 0 refills | Status: AC

## 2024-03-11 MED ORDER — PHENAZOPYRIDINE HCL 200 MG PO TABS: 200.0000 mg | ORAL_TABLET | Freq: Three times a day (TID) | ORAL | 0 refills | Status: AC

## 2024-03-11 NOTE — ED Triage Notes (Signed)
 Patient presents for burning with urination,  feeling like she has to urinate all the time x 2 weeks.  Patient urine is cloudy.  Patient has been taken tylenol  and drinking water, cranberry juice, and a detox tea.

## 2024-03-11 NOTE — Discharge Instructions (Addendum)
" °  1. Acute cystitis with hematuria (Primary) - POCT URINE DIPSTICK completed in UC's shows large leukocytes, trace blood, no nitrite, these findings are strongly indicative of urinary infection.  Empiric treatment will be initiated until culture results are available. - Urine Culture was not collected during visit due to small amount of urine with sample.  Patient advised if symptoms continue despite antibiotic therapy to return to urgent care for evaluation. - phenazopyridine  (PYRIDIUM ) 200 MG tablet; Take 1 tablet (200 mg total) by mouth 3 (three) times daily.  Dispense: 6 tablet; Refill: 0 - cefUROXime  (CEFTIN ) 250 MG tablet; Take 1 tablet (250 mg total) by mouth 2 (two) times daily with a meal for 7 days.  Dispense: 14 tablet; Refill: 0  -Continue to monitor symptoms for any change in severity if there is any escalation of current symptoms or development of new symptoms follow-up in ER for further evaluation and management. "

## 2024-03-11 NOTE — ED Provider Notes (Addendum)
 " [facility]  Note:  This document was prepared using Dragon voice recognition software and may include unintentional dictation errors.  MRN: [id number] DOB: Sep 30, 1990  Subjective:   Christy Bell is a 34 y.o. female presenting for urinary hesitancy, dysuria, cloudy urine x 2 weeks.  Patient denies any recent history of urinary tract infections.  States she has been taking Tylenol , drinking increased water, cranberry juice and using detox tea hoping that it would help with symptoms but symptoms have not resolved.  Patient denies any severe abdominal pain, flank pain, weakness, increased urinary frequency.  Current Medications[1]   Allergies[2]  Past Medical History:  Diagnosis Date   Anxiety    Asthma    Bronchitis    Depression    Fatty liver    GERD (gastroesophageal reflux disease)    Gestational diabetes    Headache    Hx of chlamydia infection    Preeclampsia    Resolved after childbirth     Past Surgical History:  Procedure Laterality Date   CESAREAN SECTION N/A 09/07/2014   Procedure: CESAREAN SECTION;  Surgeon: Gigi Botts, MD;  Location: [facility];  Service: Obstetrics;  Laterality: N/A;   CESAREAN SECTION N/A 02/26/2017   Procedure: REPEAT CESAREAN SECTION;  Surgeon: Botts Gigi, MD;  Location: [facility];  Service: Obstetrics;  Laterality: N/A;  Tracey RNFA   CHOLECYSTECTOMY N/A 10/29/2019   Procedure: LAPAROSCOPIC CHOLECYSTECTOMY;  Surgeon: Aron Shoulders, MD;  Location: [facility];  Service: General;  Laterality: N/A;   GALLBLADDER SURGERY     WISDOM TOOTH EXTRACTION     x1    Family History  Problem Relation Age of Onset   Healthy Mother    Mental illness Father    Hypertension Maternal Grandmother    Heart disease Maternal Grandmother    Mental illness Maternal Grandmother    Diabetes Paternal Grandmother     Social History[3]  ROS Refer to HPI for ROS details.  Objective:   Vitals: BP 119/85 (BP Location: Left Arm)   Pulse 81    Temp 98.8 F (37.1 C) (Oral)   Resp 18   SpO2 96%   Physical Exam Vitals and nursing note reviewed.  Constitutional:      General: She is not in acute distress.    Appearance: Normal appearance. She is well-developed. She is not ill-appearing or toxic-appearing.  HENT:     Head: Normocephalic and atraumatic.  Cardiovascular:     Rate and Rhythm: Normal rate.  Pulmonary:     Effort: Pulmonary effort is normal. No respiratory distress.     Breath sounds: No stridor. No wheezing.  Abdominal:     General: Bowel sounds are normal. There is no distension.     Palpations: Abdomen is soft.     Tenderness: There is no abdominal tenderness. There is no right CVA tenderness or left CVA tenderness.  Skin:    General: Skin is warm and dry.  Neurological:     General: No focal deficit present.     Mental Status: She is alert and oriented to person, place, and time.  Psychiatric:        Mood and Affect: Mood normal.        Behavior: Behavior normal.     Procedures  Results for orders placed or performed during the hospital encounter of 03/11/24 (from the past 24 hours)  POCT URINE DIPSTICK     Status: Abnormal   Collection Time: 03/11/24  1:23 PM  Result Value Ref  Range   Color, UA yellow yellow   Clarity, UA cloudy (A) clear   Glucose, UA negative negative mg/dL   Bilirubin, UA negative negative   Ketones, POC UA small (15) (A) negative mg/dL   Spec Grav, UA >=8.969 (A) 1.010 - 1.025   Blood, UA trace-intact (A) negative   pH, UA 6.0 5.0 - 8.0   POC PROTEIN,UA =100 (A) negative, trace   Urobilinogen, UA 1.0 0.2 or 1.0 E.U./dL   Nitrite, UA Negative Negative   Leukocytes, UA Large (3+) (A) Negative    No results found.   Assessment and Plan :     Discharge Instructions       1. Acute cystitis with hematuria (Primary) - POCT URINE DIPSTICK completed in UC's shows large leukocytes, trace blood, no nitrite, these findings are strongly indicative of urinary infection.   Empiric treatment will be initiated until culture results are available. - Urine Culture was not collected during visit due to small amount of urine with sample.  Patient advised if symptoms continue despite antibiotic therapy to return to urgent care for evaluation. - phenazopyridine  (PYRIDIUM ) 200 MG tablet; Take 1 tablet (200 mg total) by mouth 3 (three) times daily.  Dispense: 6 tablet; Refill: 0 - cefUROXime  (CEFTIN ) 250 MG tablet; Take 1 tablet (250 mg total) by mouth 2 (two) times daily with a meal for 7 days.  Dispense: 14 tablet; Refill: 0  -Continue to monitor symptoms for any change in severity if there is any escalation of current symptoms or development of new symptoms follow-up in ER for further evaluation and management.       Christy Bell        [1] No current facility-administered medications for this encounter.  Current Outpatient Medications:    cefUROXime  (CEFTIN ) 250 MG tablet, Take 1 tablet (250 mg total) by mouth 2 (two) times daily with a meal for 7 days., Disp: 14 tablet, Rfl: 0   phenazopyridine  (PYRIDIUM ) 200 MG tablet, Take 1 tablet (200 mg total) by mouth 3 (three) times daily., Disp: 6 tablet, Rfl: 0   acetaminophen  (TYLENOL ) 500 MG tablet, Take 500 mg by mouth every 6 (six) hours as needed for moderate pain (pain score 4-6)., Disp: , Rfl:    albuterol  (VENTOLIN  HFA) 108 (90 Base) MCG/ACT inhaler, Inhale 2 puffs into the lungs every 4 (four) hours as needed for wheezing or shortness of breath., Disp: 1 each, Rfl: 0   amoxicillin -clavulanate (AUGMENTIN ) 875-125 MG tablet, Take 1 tablet by mouth every 12 (twelve) hours., Disp: 14 tablet, Rfl: 0   azelastine  (ASTELIN ) 0.1 % nasal spray, Place 1 spray into both nostrils 2 (two) times daily. Use in each nostril as directed, Disp: 30 mL, Rfl: 1   brompheniramine-pseudoephedrine-DM 30-2-10 MG/5ML syrup, Take 10 mLs by mouth 4 (four) times daily as needed (cough and congestion). (Patient not taking: Reported on  10/25/2023), Disp: 120 mL, Rfl: 0   docusate sodium  (COLACE) 100 MG capsule, Take 1 capsule twice a day by oral route as needed. (Patient not taking: Reported on 12/09/2023), Disp: , Rfl:    doxycycline  (VIBRAMYCIN ) 100 MG capsule, Take 100 mg by mouth. (Patient not taking: Reported on 12/09/2023), Disp: , Rfl:    DULoxetine  (CYMBALTA ) 60 MG capsule, Take 1 capsule (60 mg total) by mouth daily., Disp: 90 capsule, Rfl: 1   famotidine  (PEPCID ) 40 MG tablet, Take 20 mg by mouth. (Patient not taking: Reported on 12/09/2023), Disp: , Rfl:    fluticasone  (FLONASE ) 50 MCG/ACT  nasal spray, Place 2 sprays into both nostrils daily. (Patient not taking: Reported on 10/25/2023), Disp: 9.9 mL, Rfl: 0   hydrOXYzine (ATARAX) 10 MG tablet, Take 1 tablet by mouth daily as needed., Disp: , Rfl:    ibuprofen  (ADVIL ) 200 MG tablet, Take 200 mg by mouth every 6 (six) hours as needed for moderate pain. (Patient not taking: No sig reported), Disp: , Rfl:    levonorgestrel (MIRENA, 52 MG,) 20 MCG/DAY IUD, 1 each by Intrauterine route once., Disp: , Rfl:    Lurasidone HCl (LATUDA) 60 MG TABS, Take 60 mg by mouth. (Patient not taking: Reported on 12/09/2023), Disp: , Rfl:    SYMBICORT 160-4.5 MCG/ACT inhaler, Inhale 2 puffs into the lungs daily as needed (sob/wheezing).  (Patient not taking: Reported on 10/25/2023), Disp: , Rfl:  [2] No Known Allergies [3]  Social History Tobacco Use   Smoking status: Former    Current packs/day: 0.00    Types: Cigarettes    Quit date: 02/07/2014    Years since quitting: 10.0   Smokeless tobacco: Never  Vaping Use   Vaping status: Never Used  Substance Use Topics   Alcohol use: Not Currently    Comment: Social   Drug use: Not Currently    Types: Marijuana     Aurea Goodell B, NP 03/11/24 1346  "
